# Patient Record
Sex: Male | Born: 1935 | Race: White | Hispanic: No | Marital: Married | Smoking: Former smoker
Health system: Southern US, Community
[De-identification: ages and names within clinical notes are randomized; demographics above are authoritative.]

## PROBLEM LIST (undated history)

## (undated) DIAGNOSIS — C61 Malignant neoplasm of prostate: Secondary | ICD-10-CM

## (undated) DIAGNOSIS — D469 Myelodysplastic syndrome, unspecified: Secondary | ICD-10-CM

## (undated) DIAGNOSIS — I998 Other disorder of circulatory system: Secondary | ICD-10-CM

## (undated) DIAGNOSIS — R011 Cardiac murmur, unspecified: Secondary | ICD-10-CM

## (undated) DIAGNOSIS — L03119 Cellulitis of unspecified part of limb: Secondary | ICD-10-CM

## (undated) DIAGNOSIS — D4620 Refractory anemia with excess of blasts, unspecified: Secondary | ICD-10-CM

## (undated) DIAGNOSIS — D692 Other nonthrombocytopenic purpura: Secondary | ICD-10-CM

## (undated) DIAGNOSIS — K219 Gastro-esophageal reflux disease without esophagitis: Secondary | ICD-10-CM

## (undated) DIAGNOSIS — D46Z Other myelodysplastic syndromes: Secondary | ICD-10-CM

## (undated) DIAGNOSIS — L03115 Cellulitis of right lower limb: Secondary | ICD-10-CM

## (undated) DIAGNOSIS — Z9289 Personal history of other medical treatment: Secondary | ICD-10-CM

## (undated) DIAGNOSIS — R0600 Dyspnea, unspecified: Secondary | ICD-10-CM

## (undated) DIAGNOSIS — K4090 Unilateral inguinal hernia, without obstruction or gangrene, not specified as recurrent: Secondary | ICD-10-CM

## (undated) DIAGNOSIS — I890 Lymphedema, not elsewhere classified: Secondary | ICD-10-CM

## (undated) DIAGNOSIS — L02419 Cutaneous abscess of limb, unspecified: Secondary | ICD-10-CM

## (undated) DIAGNOSIS — D472 Monoclonal gammopathy: Secondary | ICD-10-CM

## (undated) DIAGNOSIS — E039 Hypothyroidism, unspecified: Secondary | ICD-10-CM

## (undated) DIAGNOSIS — M1990 Unspecified osteoarthritis, unspecified site: Secondary | ICD-10-CM

## (undated) DIAGNOSIS — R609 Edema, unspecified: Secondary | ICD-10-CM

## (undated) DIAGNOSIS — D61818 Other pancytopenia: Secondary | ICD-10-CM

## (undated) DIAGNOSIS — L0291 Cutaneous abscess, unspecified: Secondary | ICD-10-CM

## (undated) DIAGNOSIS — D72821 Monocytosis (symptomatic): Secondary | ICD-10-CM

## (undated) HISTORY — DX: Other myelodysplastic syndromes: D46.Z

## (undated) HISTORY — PX: ROBOT ASSISTED LAPAROSCOPIC RADICAL PROSTATECTOMY: SHX5141

## (undated) HISTORY — PX: CATARACT EXTRACTION W/ INTRAOCULAR LENS  IMPLANT, BILATERAL: SHX1307

## (undated) HISTORY — DX: Other nonthrombocytopenic purpura: D69.2

## (undated) HISTORY — DX: Monocytosis (symptomatic): D72.821

## (undated) HISTORY — DX: Cutaneous abscess, unspecified: L02.91

## (undated) HISTORY — DX: Edema, unspecified: R60.9

## (undated) HISTORY — DX: Monoclonal gammopathy: D47.2

## (undated) HISTORY — PX: TONSILLECTOMY AND ADENOIDECTOMY: SUR1326

## (undated) HISTORY — DX: Other disorder of circulatory system: I99.8

## (undated) HISTORY — DX: Refractory anemia with excess of blasts, unspecified: D46.20

## (undated) HISTORY — DX: Cellulitis of right lower limb: L03.115

## (undated) HISTORY — DX: Other pancytopenia: D61.818

---
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
## 1997-10-07 ENCOUNTER — Encounter: Admission: RE | Admit: 1997-10-07 | Discharge: 1997-10-07 | Payer: Self-pay | Admitting: Internal Medicine

## 1997-11-28 ENCOUNTER — Ambulatory Visit (HOSPITAL_COMMUNITY): Admission: RE | Admit: 1997-11-28 | Discharge: 1997-11-28 | Payer: Self-pay | Admitting: Gastroenterology

## 1999-11-21 ENCOUNTER — Encounter: Admission: RE | Admit: 1999-11-21 | Discharge: 1999-11-21 | Payer: Self-pay | Admitting: Internal Medicine

## 1999-11-22 ENCOUNTER — Encounter: Admission: RE | Admit: 1999-11-22 | Discharge: 1999-11-22 | Payer: Self-pay | Admitting: Internal Medicine

## 2000-12-25 ENCOUNTER — Encounter: Admission: RE | Admit: 2000-12-25 | Discharge: 2000-12-25 | Payer: Self-pay | Admitting: Internal Medicine

## 2001-02-02 ENCOUNTER — Encounter (INDEPENDENT_AMBULATORY_CARE_PROVIDER_SITE_OTHER): Payer: Self-pay | Admitting: Specialist

## 2001-02-02 ENCOUNTER — Ambulatory Visit (HOSPITAL_COMMUNITY): Admission: RE | Admit: 2001-02-02 | Discharge: 2001-02-02 | Payer: Self-pay | Admitting: Gastroenterology

## 2001-12-28 ENCOUNTER — Encounter: Admission: RE | Admit: 2001-12-28 | Discharge: 2001-12-28 | Payer: Self-pay | Admitting: Sports Medicine

## 2001-12-28 ENCOUNTER — Encounter: Payer: Self-pay | Admitting: Sports Medicine

## 2002-10-13 ENCOUNTER — Encounter: Admission: RE | Admit: 2002-10-13 | Discharge: 2002-10-13 | Payer: Self-pay | Admitting: Internal Medicine

## 2004-07-05 ENCOUNTER — Ambulatory Visit: Payer: Self-pay | Admitting: Sports Medicine

## 2004-07-19 ENCOUNTER — Ambulatory Visit: Payer: Self-pay | Admitting: Sports Medicine

## 2005-02-27 ENCOUNTER — Ambulatory Visit: Payer: Self-pay | Admitting: Family Medicine

## 2005-03-05 ENCOUNTER — Ambulatory Visit: Payer: Self-pay | Admitting: Sports Medicine

## 2005-03-05 ENCOUNTER — Encounter: Admission: RE | Admit: 2005-03-05 | Discharge: 2005-03-05 | Payer: Self-pay | Admitting: Sports Medicine

## 2005-05-03 ENCOUNTER — Ambulatory Visit: Payer: Self-pay | Admitting: Sports Medicine

## 2006-05-13 ENCOUNTER — Ambulatory Visit (HOSPITAL_COMMUNITY): Admission: RE | Admit: 2006-05-13 | Discharge: 2006-05-13 | Payer: Self-pay | Admitting: Urology

## 2006-06-05 DIAGNOSIS — J309 Allergic rhinitis, unspecified: Secondary | ICD-10-CM | POA: Insufficient documentation

## 2006-06-05 DIAGNOSIS — C4490 Unspecified malignant neoplasm of skin, unspecified: Secondary | ICD-10-CM

## 2006-06-05 DIAGNOSIS — I10 Essential (primary) hypertension: Secondary | ICD-10-CM | POA: Insufficient documentation

## 2006-06-05 DIAGNOSIS — K210 Gastro-esophageal reflux disease with esophagitis: Secondary | ICD-10-CM

## 2006-06-10 ENCOUNTER — Ambulatory Visit: Admission: RE | Admit: 2006-06-10 | Discharge: 2006-08-22 | Payer: Self-pay | Admitting: Radiation Oncology

## 2006-08-06 ENCOUNTER — Encounter (INDEPENDENT_AMBULATORY_CARE_PROVIDER_SITE_OTHER): Payer: Self-pay | Admitting: Specialist

## 2006-08-06 ENCOUNTER — Inpatient Hospital Stay (HOSPITAL_COMMUNITY): Admission: RE | Admit: 2006-08-06 | Discharge: 2006-08-08 | Payer: Self-pay | Admitting: Urology

## 2007-01-22 ENCOUNTER — Ambulatory Visit: Payer: Self-pay | Admitting: Sports Medicine

## 2007-01-27 LAB — CONVERTED CEMR LAB
ALT: 19 units/L (ref 0–53)
AST: 16 units/L (ref 0–37)
Albumin: 4.6 g/dL (ref 3.5–5.2)
Alkaline Phosphatase: 66 units/L (ref 39–117)
BUN: 19 mg/dL (ref 6–23)
CO2: 24 meq/L (ref 19–32)
Calcium: 9.3 mg/dL (ref 8.4–10.5)
Chloride: 104 meq/L (ref 96–112)
Cholesterol: 203 mg/dL — ABNORMAL HIGH (ref 0–200)
Creatinine, Ser: 0.92 mg/dL (ref 0.40–1.50)
Glucose, Bld: 103 mg/dL — ABNORMAL HIGH (ref 70–99)
HDL: 40 mg/dL (ref >39)
LDL Cholesterol: 134 mg/dL — ABNORMAL HIGH (ref 0–99)
Potassium: 4.4 meq/L (ref 3.5–5.3)
Sodium: 141 meq/L (ref 135–145)
Total Bilirubin: 0.8 mg/dL (ref 0.3–1.2)
Total CHOL/HDL Ratio: 5.1
Total Protein: 7 g/dL (ref 6.0–8.3)
Triglycerides: 147 mg/dL (ref <150)
VLDL: 29 mg/dL (ref 0–40)

## 2008-01-05 ENCOUNTER — Encounter: Payer: Self-pay | Admitting: Internal Medicine

## 2009-10-17 ENCOUNTER — Ambulatory Visit: Payer: Self-pay | Admitting: Sports Medicine

## 2009-10-17 DIAGNOSIS — C61 Malignant neoplasm of prostate: Secondary | ICD-10-CM

## 2009-10-17 DIAGNOSIS — M5430 Sciatica, unspecified side: Secondary | ICD-10-CM | POA: Insufficient documentation

## 2009-10-17 DIAGNOSIS — M545 Low back pain: Secondary | ICD-10-CM

## 2009-11-07 ENCOUNTER — Ambulatory Visit: Payer: Self-pay | Admitting: Sports Medicine

## 2009-11-07 ENCOUNTER — Ambulatory Visit: Payer: Self-pay | Admitting: Family Medicine

## 2009-11-07 ENCOUNTER — Encounter: Admission: RE | Admit: 2009-11-07 | Discharge: 2009-11-07 | Payer: Self-pay | Admitting: Sports Medicine

## 2009-11-07 DIAGNOSIS — S22009A Unspecified fracture of unspecified thoracic vertebra, initial encounter for closed fracture: Secondary | ICD-10-CM | POA: Insufficient documentation

## 2009-11-07 DIAGNOSIS — M25559 Pain in unspecified hip: Secondary | ICD-10-CM

## 2009-11-07 LAB — CONVERTED CEMR LAB
HCT: 39.1 % (ref 39.0–52.0)
Hemoglobin: 12.7 g/dL — ABNORMAL LOW (ref 13.0–17.0)
MCHC: 32.5 g/dL (ref 30.0–36.0)
MCV: 97 fL (ref 78.0–100.0)
Platelets: 154 10*3/uL (ref 150–400)
RBC: 4.03 M/uL — ABNORMAL LOW (ref 4.22–5.81)
RDW: 16.5 % — ABNORMAL HIGH (ref 11.5–15.5)
WBC: 4.2 10*3/uL (ref 4.0–10.5)

## 2009-11-08 ENCOUNTER — Encounter (INDEPENDENT_AMBULATORY_CARE_PROVIDER_SITE_OTHER): Payer: Self-pay | Admitting: *Deleted

## 2009-11-10 ENCOUNTER — Encounter: Admission: RE | Admit: 2009-11-10 | Discharge: 2009-11-10 | Payer: Self-pay | Admitting: Sports Medicine

## 2009-11-28 ENCOUNTER — Ambulatory Visit: Payer: Self-pay | Admitting: Sports Medicine

## 2010-05-08 NOTE — Assessment & Plan Note (Signed)
Summary: f/u,mc   Vital Signs:  Patient profile:   77 year old male BP sitting:   135 / 84  Vitals Entered By: Lillia Pauls CMA (November 07, 2009 10:35 AM)  History of Present Illness: Joel Macdonald comes back with more left hip and leg pain also low back pain now getting some pain into rt hip  the exercises and stretches were hard and sometimes inc the pain  he notes that if he gets up and gets going during day pain is less at night hard to sleep tramadol made him feel woozy so he stopped Gabapentin - he got up to a 300 mgm dose at night and the sciatic pain was not difft and also he felt very groggy so he stopped this as well  No recent injury  Has done well thus far from his prostate surgery PSA this summer was 0  Physical Exam  General:  Well-developed,well-nourished,in no acute distress; alert,appropriate and cooperative throughout examination Msk:  Hips normal ROM bilat with RT somewhat better than left however, IR/ ER and flexion all cause some pain to radiate into groin  low back feels tight w movement and causes some tightness down lat left leg  able to walk w normal strenght however, has more trendelenburg change with tilt to left   Impression & Recommendations:  Problem # 1:  BACK PAIN, LUMBAR (ICD-724.2)  His updated medication list for this problem includes:    Tramadol Hcl 50 Mg Tabs (Tramadol hcl) .Marland Kitchen... 1 by mouth qid prn  Orders: Radiology other (Radiology Other)   concerned about lumbar stenosis and DDD  will ck films  Problem # 2:  SCIATICA, LEFT (ICD-724.3)  His updated medication list for this problem includes:    Tramadol Hcl 50 Mg Tabs (Tramadol hcl) .Marland Kitchen... 1 by mouth qid prn  Orders: Radiology other (Radiology Other)  hold meds for now but consider low dose amitriptyline at hs  Problem # 3:  HIP PAIN, BILATERAL (ICD-719.45)  His updated medication list for this problem includes:    Tramadol Hcl 50 Mg Tabs (Tramadol hcl) .Marland Kitchen... 1 by mouth  qid prn  Orders: Radiology other (Radiology Other) this may be referred from back but will ck AP of pelvis for DJD or mets  Problem # 4:  Hx of ADENOCARCINOMA, PROSTATE, GLEASON GRADE 7 (ICD-185)  Future Orders: Miscellaneous Lab Charge-[facility] ([id number]) ... 11/06/2009  Doubt Mets but ck CBC and ESr  Review xrays  Complete Medication List: 1)  Tramadol Hcl 50 Mg Tabs (Tramadol hcl) .Marland Kitchen.. 1 by mouth qid prn 2)  Neurontin 100 Mg Caps (Gabapentin) .... Take one tab qhs  Other Orders: Future Orders: Miscellaneous Lab Charge-[facility] [id number]) ... 11/07/2010

## 2010-05-08 NOTE — Assessment & Plan Note (Signed)
Summary: TO SEE DR Klee Kolek/[facility]   Vital Signs:  Patient profile:   77 year old male Height:      74 inches Weight:      218 pounds BMI:     28.09 BP sitting:   152 / 90  (right arm) Cuff size:   regular  Vitals Entered By: Tessie Fass CMA (October 17, 2009 9:17 AM) CC: left hip pain Pain Assessment Patient in pain? yes     Location: left hip Intensity: 7   CC:  left hip pain.  History of Present Illness: Some bilat hip pain worse on left now radiates down to thigh and also along sciatic distribution also lower back pain that will shift to leg  goes to gym at least 3 days per week does treadmill leg press and bike yesterday caused increased pain  comes for eval    Physical Exam  General:  Well-developed,well-nourished,in no acute distress; alert,appropriate and cooperative throughout examination Msk:  Hip shows norm ROM bilat with good IR, ER and flexion SLR does create some sciatic sxs on left no weakness on heel, toe, tandem, supinated walk good strength on neuro testing  FABER on left causes mild sxs and dec mobility  dec flex of low back   Impression & Recommendations:  Problem # 1:  BACK PAIN, LUMBAR (ICD-724.2)  His updated medication list for this problem includes:    Tramadol Hcl 50 Mg Tabs (Tramadol hcl) .Marland Kitchen... 1 by mouth qid prn  I think this is likely DJD not any red flags for met dx from his prostate ca recent pSA was 0  given knee to chest series  reck 6 wks  Problem # 2:  SCIATICA, LEFT (ICD-724.3)  His updated medication list for this problem includes:    Tramadol Hcl 50 Mg Tabs (Tramadol hcl) .Marland Kitchen... 1 by mouth qid prn   will hope this helps enough if not consider nite time neurontin  Problem # 3:  Hx of ADENOCARCINOMA, PROSTATE, GLEASON GRADE 7 (ICD-185) followed by dr Ferne Reus [facility] stable on follow up  Complete Medication List: 1)  Tramadol Hcl 50 Mg Tabs (Tramadol hcl) .Marland Kitchen.. 1 by mouth qid prn  Patient  Instructions: 1)  avoid leg presses 2)  treadmill and bike are good 3)  any exercises that don't bother this are fine 4)  begin tramadol at least twice daily but up to 4 times if needed 5)  may want to get off voltaren 6)  let's see how this does over next 4 to 6 weeks and recheck at that time Prescriptions: TRAMADOL HCL 50 MG TABS (TRAMADOL HCL) 1 by mouth qid prn  #120 x 3   Entered and Authorized by:   Enid Baas MD   Signed by:   Enid Baas MD on 10/17/2009   Method used:   Print then Give to Patient   RxID:   [id number]

## 2010-05-08 NOTE — Assessment & Plan Note (Signed)
Summary: F/U,MC   Vital Signs:  Patient profile:   77 year old male BP sitting:   142 / 80  Vitals Entered By: Lillia Pauls CMA (November 28, 2009 11:08 AM)  History of Present Illness: f/u left hip and leg pain; also low back pain - all have improved. pain is now controlled with 2-3 ASA daily. patient states that Ibuprofen caused nose bleeds. other medications (tramadol and gabapentin) caused sedation.   XRAY: 1.  Degenerative disc disease at L4-5. 2.  Degenerative change involves the facet joints of L4-5 and L5-S1. 3.  Probable old partial compression deformity of T12.      Current Medications (verified): 1)  None  Allergies (verified): No Known Drug Allergies PMH-FH-SH reviewed for relevance  Review of Systems MS:  Denies low back pain and muscle aches. Neuro:  Denies numbness and tingling.  Physical Exam  General:  Well-developed,well-nourished,in no acute distress; alert,appropriate and cooperative throughout examination Msk:  Hips: normal ROM bilat, IR/ ER and flexion - no pain now; low back with decreased lordosis, normal ROM. neg FABER bilat. neg SLR bilat. good LE strength.  Pulses:  2 + DP Extremities:  no edema Neurologic:  strength normal in all extremities, sensation intact to light touch, and DTRs symmetrical and normal.     Impression & Recommendations:  Problem # 1:  HIP PAIN, BILATERAL (ICD-719.45) Assessment Improved  The following medications were removed from the medication list:    Tramadol Hcl 50 Mg Tabs (Tramadol hcl) .Marland Kitchen... 1 by mouth qid prn  Problem # 2:  BACK PAIN, LUMBAR (ICD-724.2) Assessment: Improved  The following medications were removed from the medication list:    Tramadol Hcl 50 Mg Tabs (Tramadol hcl) .Marland Kitchen... 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  Problem # 4:  SCIATICA, LEFT (ICD-724.3)  The  following medications were removed from the medication list:    Tramadol Hcl 50 Mg Tabs (Tramadol hcl) .Marland Kitchen... 1 by mouth qid as needed  this is less and will follow before starting amitriptyline If not staying at low level will start aft very low dose  Patient Instructions: 1)  follow up as needed

## 2010-05-08 NOTE — Miscellaneous (Signed)
Summary: bone mineral density appt  appt for bone mineral density test is schd for fri, aug 5th at 9:[facility]. address is [address], [place] [address]. phone number is [phone]

## 2010-08-24 NOTE — Procedures (Signed)
[place]  Patient:    Joel Macdonald, Joel Macdonald Visit Number: [id number] MRN: [id number]          Service Type: END Location: ENDO Attending Physician:  Rich Brave Dictated by:   Florencia Reasons, M.D. Proc. Date: 02/02/01 Admit Date:  02/02/2001   CC:         C. Ulyess Mort, M.D.   Procedure Report  PROCEDURE PERFORMED:  Colonoscopy with biopsies.  ENDOSCOPIST:  Florencia Reasons, M.D.  INDICATIONS FOR PROCEDURE:  Follow-up of colonic adenomas removed several years ago in this 77 year old physician.  FINDINGS:  Several small polyps removed.  Moderate sigmoid diverticulosis.  DESCRIPTION OF PROCEDURE:  The nature, purpose and risks of the procedure were familiar to the patient from prior examination and he provided written consent.  Sedation was fentanyl 75 mcg and Versed 7.5 mg IV without arrhythmias or desaturation.  The Olympus adjustable tension pediatric video colonoscope was advanced with mild difficulty to a somewhat fixated and angulated sigmoid region, where there was a lot of diverticular disease, more than I remembered there being several years ago, and then quite easily, the remainder of the way to the cecum was identified by visualization of the appendiceal orifice and ileocecal valve.  Pullback was then performed.  There were two small sessile polyps removed from the ascending colon by cold biopsy technique, and two small sessile polyps removed at about 45 and 40 cm by cold biopsy technique.  No large polyps, cancer, colitis, or vascular malformations were observed.  There was a significant sigmoid diverticulosis as noted above.  Retroflexion was normal.  IMPRESSION:  Diminutive colon polyps.  Moderately severe sigmoid diverticulosis.  PLAN:  Await pathology with follow-up colonoscopy in three to five years. Dictated by:   Florencia Reasons, M.D. Attending Physician:  Rich Brave DD:   02/02/01 TD:  02/02/01 Job: [id number]

## 2010-08-24 NOTE — Op Note (Signed)
NAME:  Joel Macdonald, Joel Macdonald                ACCOUNT NO.:  [id number]   MEDICAL RECORD NO.:  [id number]          PATIENT TYPE:  INP   LOCATION:  1413                         FACILITY:  [facility]   PHYSICIAN:  Lucrezia Starch. Earlene Plater, M.D.  DATE OF BIRTH:  03-Apr-1931   DATE OF PROCEDURE:  08/06/2006  DATE OF DISCHARGE:                               OPERATIVE REPORT   DIAGNOSIS:  Adenocarcinoma of prostate.   OPERATIVE PROCEDURE:  Robotic radical prostatectomy and bilateral pelvic  lymphadenectomy.   SURGEON:  Lucrezia Starch. Earlene Plater, M.D.   ASSISTANT:  Heloise Purpura, M.D.   ANESTHESIA:  General endotracheal anesthesia.   BLOOD LOSS:  400 mL.   TUBES:  20-French coude Foley catheter and a large round Blake drain.   COMPLICATIONS:  None.   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  He  subsequently underwent ultrasound and biopsy of the prostate on May 01, 2006, and was to found have a Gleason score 7 which was 3+4  adenocarcinoma in approximately 10% of the biopsies from the left side  of the prostate.  His metastatic workup has been negative.  After  understanding the risks, benefits and alternatives, he has elected to  undergo the above procedure.   PROCEDURE IN DETAIL:  The patient was placed in a supine position, after  proper general endotracheal anesthesia, was placed in the exaggerated  lithotomy position, prepped and draped in a sterile fashion.  A 24-  French 30 mL balloon catheter was passed in the bladder and the urine  was drained.  A periumbilical incision was made and under direct vision,  the peritoneum was punctured. A 12 mm cannula was placed and the abdomen  was insufflated.  Inspection of the abdomen revealed some obesity but no  lymphadenopathy or other abnormalities noted. Right and left robotic arm  ports were placed one handbreadth lateral to the right and left side to  the  periumbilical port.  The fourth arm port was placed lateral to the  left robotic port and two working ports were placed, a 5 mm port just  superior and lateral to the camera port and a another 12 mm port was  placed on the right side laterally. The hot shears were placed in the  right robotic arm, the bipolar grasper in the left robotic arm, and a  prograsp in the third arm. The bladder was filled with approximately 200  mL of sterile water.  The anterior bladder flap was created, the medial  umbilical ligaments were taken down, and the space of Retzius was  entered.   The area was defatted. The endopelvic fascia was taken down bilaterally  to the area of the puboprostatic ligaments and the dorsal vein complex.  The puboprostatic ligaments were carefully taken down, not in their  entirety, and the dorsal vein complex was clipped and cut with an Endo-  GIA stapler.  The bladder neck was then approached and sharply dissected  down to the catheter.  This was used as a  traction device with the  fourth arm and the posterior prostate was taken from the bladder neck  and seminal vesicles and ampulla vas deferens were dissected out, were  amputated bilaterally, and care was made to avoid the nerves at the tips  of the seminal vesicles.  Denonvilliers' fascia was then taken down and  the plane was created onto the prostate. A bilateral nerve spare was  performed.  The dorsal neurovascular bundles were taken posterior  laterally utilizing a veil technique and the pedicles were taken  bilateral in serial packets and clipped with Hem-A-Lock clips.  The  prostate was taken down to the apex.  The urethra was sharply incised  anteriorly. The posterior urethral plate was taken down and the specimen  was placed in the right lower quadrant.  There was some slight oozing  from the left pedicle area that was oversewn with a figure-of-eight 3-0  Vicryl suture.   Next, the lymph nodes were approached and  bilateral pelvic  lymphadenectomy was performed. Both obturator and external iliac lymph  nodes were taken down, the obturator nerve and vessels were identified  and protected, and the nodes were clipped proximally and distally and  removed as separate right and left side.  Good hemostasis was noted be  present.  The urethrovesical anastomosis was then performed. A holding  stitch with the 3-0 catgut are C3 Vicryl suture was placed in the 6  o'clock position and the anastomosis was created with 3-0 Monocryl dyed  and undyed sutures tied together in a running fashion and tied  anteriorly.  An amp of indigo carmine had been given IV. The orifices  were identified and protected.   Following this, a 20-French coude Foley catheter was passed easily in  the bladder and inflated with 10 mL and the bladder was noted to  irrigate clear without leakage.  A large round Blake drain was placed  through the fourth arm port and placed into the pelvis.  Utilizing an  endocatch bag, the prostate was captured. The right 12 mm port was  closed under direct vision with a suture passer.  Each of the ports were  removed and visualized, there was no bleeding noted to be present.  The  remainder the peritoneal cavity was deinsufflated and there was good  hemostasis present.  The specimen was then removed.  The periumbilical  incision fascia was closed with a running 2-0 Vicryl suture. All wounds  were injected with 0.25% Marcaine and closed with skin staples and  dressed sterilely.  The bladder was again irrigated clear and the drain  was sutured in with 3-0 nylon suture.  The patient tolerated the  procedure well.  There were no complications.  He was taken to the  recovery room stable.      Ronald L. Earlene Plater, M.D.  Electronically Signed     RLD/MEDQ  D:  08/06/2006  T:  08/06/2006  Job:  [id number]

## 2010-08-24 NOTE — H&P (Signed)
NAME:  Joel Macdonald, Joel Macdonald                ACCOUNT NO.:  [id number]   MEDICAL RECORD NO.:  [id number]          PATIENT TYPE:  INP   LOCATION:  1413                         FACILITY:  [facility]   PHYSICIAN:  Lucrezia Starch. Earlene Plater, M.D.  DATE OF BIRTH:  Jun 10, 1930   DATE OF ADMISSION:  08/06/2006  DATE OF DISCHARGE:                              HISTORY & PHYSICAL   CHIEF COMPLAINT:  I have prostate cancer.   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  He had a biopsy in the past  which was negative.  He was subsequently found to have a PSA of 11.77.  He underwent transrectal ultrasound and biopsy of the prostate on  May 01, 2006, which revealed a Gleason score 7 which was 3 + 4  adenocarcinoma in 10% of the biopsies from the left side of the  prostate.  His metastatic workup was negative, and after understanding  the risks, benefits and alternatives,  elected to proceed with robotic  radical prostatectomy and bilateral pelvic lymphadenectomy.   PAST MEDICAL HISTORY:  He has no known allergies.   MEDICATIONS:  He is on one research for which he stopped well ahead.  Otherwise, an aspirin which he stopped.   ILLNESSES:  He essentially has none.  He has some mild arthritis.   SURGERY:  He had a T&A as a child and a colonoscopy.  He has had IV  sedation the past and really had no anesthesia problems.  He had a  series of cardiac symptoms in the past but none recently and his workup  has been negative.   SOCIAL HISTORY:  He has 1-2 drinks daily.  Negative smoker.   FAMILY HISTORY:  Not significant.   REVIEW OF SYSTEMS:  He has no shortness of breath, dyspnea on exertion,  chest pain or GI complaints.   PHYSICAL EXAMINATION:  VITAL SIGNS:  Blood pressure 145/93, pulse 89,  respiration 14, temperature 97.5 degrees Fahrenheit.  GENERAL:  He is well nourished, well groomed, oriented x3.  HEENT:  Normal.  NECK:  Without masses or  thyromegaly.  CHEST:  Normal diaphragmatic motion.  HEART:  Normal sinus rhythm without murmurs or gallops.  ABDOMEN:  Soft, nontender without mass or organomegaly.  EXTREMITY:  Normal.  NEURO:  Intact.  SKIN:  Normal.  GENITOURINARY:  Penis, meatus, scrotum, testicle, adnexa, anus, and  perineum:  Normal.  RECTAL:  The vault is empty.  Prostate approximately 30 g, it is smooth  and really dormant.   IMPRESSION:  Clinical stage T1c adenocarcinoma of the prostate.   PLAN:  Robotic radical prostatectomy with bilateral pelvic  lymphadenectomy.      Ronald L. Earlene Plater, M.D.  Electronically Signed     RLD/MEDQ  D:  08/06/2006  T:  08/07/2006  Job:  [id number]

## 2010-08-24 NOTE — Discharge Summary (Signed)
NAME:  Joel Macdonald, LASTER                ACCOUNT NO.:  [id number]   MEDICAL RECORD NO.:  [id number]          PATIENT TYPE:  INP   LOCATION:  1413                         FACILITY:  [facility]   PHYSICIAN:  Lucrezia Starch. Earlene Plater, M.D.  DATE OF BIRTH:  07/25/1930   DATE OF ADMISSION:  08/06/2006  DATE OF DISCHARGE:  08/08/2006                               DISCHARGE SUMMARY   ADMISSION DIAGNOSIS:  Prostate cancer.   DISCHARGE DIAGNOSIS:  Status post robotic assisted laparoscopic  prostatectomy with bilateral pelvic lymphadenectomy.   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  He had a biopsy which was negative.  Most  recently his PSA was 11.77.  He underwent repeat transrectal ultrasound  biopsy of the prostate on May 01, 2006.  This biopsy revealed a  Gleason score of 3 + 4 equals 7 adenocarcinoma in 10% of the biopsies  from the left side of the prostate.  His metastatic workup was negative.  After a lengthy discussion with Dr. Gaynelle Arabian, he elected to undergo  robotic assisted laparoscopic prostatectomy.   PAST MEDICAL HISTORY:  Significant for arthritis of the hands.   PAST SURGICAL HISTORY:  Tonsillectomy and adenoidectomy as a child.   ALLERGIES:  None known.   HOSPITAL COURSE:  On August 06, 2006 the patient was electively admitted  to [facility] long hospital under the care of Dr. Darvin Neighbours; he underwent  the following procedure:  Robotic-assisted radical laparoscopic  prostatectomy with bilateral pelvic rim __________.  He tolerated this  procedure well; transferring in stable condition to PACU.  He was  extubated immediately following surgery, and awoke from surgery  neurologically intact.   On postop day #1 vital signs remained stable, abdomen was soft; wounds  were clear.  The Bowie drain was discontinued.  WBC 7.2, hemoglobin  12.5, hematocrit 36.7.  Sodium 134, potassium 4.8, chloride 104, CO2 28,  glucose 165, BUN 12,  creatinine 0.87.  He remained and clear liquids.   On postop day #2 the patient continued to do well, ambulating without  difficulty, passing flatus, continued to tolerate clear liquids.  Pain  minimal with PCA and vital signs remained stable.  Abdomen remained soft  with staples intact, Foley draining clear urine.  The patient is  discharged home.  Condition on discharge improved.   DISCHARGE MEDICATIONS:  1. Vicodin 1-2 p.o. q.3-4 h.  p.r.n. pain.  2. Cipro 500 mg one p.o. b.i.d.  3. Colace 100 mg one p.o. b.i.d.   ACTIVITY:  May increase slowly, may walk upstairs, may shower and bathe.  No lifting greater than 5 pounds for three weeks, no driving for three  weeks, no sexual activity for four weeks.   DIET:  Continue clear liquids but may advance as tolerated.   WOUND CARE:  The patient may shower.   FOLLOWUP:  To see Cammy Copa, FNP-C next Tuesday; he will call  to make that appointment.     ______________________________  Alessandra Bevels. Chase Picket, FNP-C      Ronald L. Earlene Plater, M.D.  Electronically Signed    JML/MEDQ  D:  08/08/2006  T:  08/08/2006  Job:  [id number]   cc:   Sibyl Parr. Darrick Penna, M.D.

## 2011-05-07 ENCOUNTER — Telehealth: Payer: Self-pay | Admitting: Oncology

## 2011-05-07 NOTE — Telephone Encounter (Signed)
Called pt, left message to call us back to confirm appt on 05/20/11

## 2011-05-08 ENCOUNTER — Telehealth: Payer: Self-pay | Admitting: Oncology

## 2011-05-08 NOTE — Telephone Encounter (Signed)
Talked to pt, he is aware of appt on 05/20/11

## 2011-05-09 ENCOUNTER — Telehealth: Payer: Self-pay | Admitting: Oncology

## 2011-05-09 NOTE — Telephone Encounter (Signed)
Referred by Dr. Merlene Laughter Dx- Pancytopenia

## 2011-05-20 ENCOUNTER — Ambulatory Visit: Payer: Medicare Other

## 2011-05-20 ENCOUNTER — Other Ambulatory Visit: Payer: Self-pay

## 2011-05-20 ENCOUNTER — Ambulatory Visit (HOSPITAL_BASED_OUTPATIENT_CLINIC_OR_DEPARTMENT_OTHER): Payer: Medicare Other | Admitting: Oncology

## 2011-05-20 ENCOUNTER — Other Ambulatory Visit (HOSPITAL_BASED_OUTPATIENT_CLINIC_OR_DEPARTMENT_OTHER): Payer: Medicare Other | Admitting: Lab

## 2011-05-20 DIAGNOSIS — D61818 Other pancytopenia: Secondary | ICD-10-CM

## 2011-05-20 LAB — CBC & DIFF AND RETIC
BASO%: 0.7 % (ref 0.0–2.0)
Basophils Absolute: 0 10*3/uL (ref 0.0–0.1)
EOS%: 0.7 % (ref 0.0–7.0)
Eosinophils Absolute: 0 10*3/uL (ref 0.0–0.5)
HCT: 37.1 % — ABNORMAL LOW (ref 38.4–49.9)
HGB: 12.7 g/dL — ABNORMAL LOW (ref 13.0–17.1)
LYMPH%: 37.9 % (ref 14.0–49.0)
MCH: 33.3 pg (ref 27.2–33.4)
MCHC: 34.2 g/dL (ref 32.0–36.0)
MCV: 97.4 fL (ref 79.3–98.0)
MONO#: 0.4 10*3/uL (ref 0.1–0.9)
MONO%: 9.9 % (ref 0.0–14.0)
NEUT%: 50.8 % (ref 39.0–75.0)
Platelets: 127 10*3/uL — ABNORMAL LOW (ref 140–400)
RDW: 16.1 % — ABNORMAL HIGH (ref 11.0–14.6)
Retic Ct Abs: 78.11 10*3/uL (ref 34.80–93.90)
WBC: 4.4 10*3/uL (ref 4.0–10.3)
lymph#: 1.7 10*3/uL (ref 0.9–3.3)

## 2011-05-20 LAB — IRON AND TIBC
%SAT: 18 % — ABNORMAL LOW (ref 20–55)
Iron: 68 ug/dL (ref 42–165)
TIBC: 381 ug/dL (ref 215–435)
UIBC: 313 ug/dL (ref 125–400)

## 2011-05-20 LAB — FERRITIN: Ferritin: 106 ng/mL (ref 22–322)

## 2011-05-20 LAB — MORPHOLOGY: PLT EST: DECREASED

## 2011-05-20 LAB — CHCC SMEAR

## 2011-05-20 LAB — LACTATE DEHYDROGENASE: LDH: 139 U/L (ref 94–250)

## 2011-05-20 NOTE — Progress Notes (Signed)
New Patient Hematology-Oncology Evaluation   Joel Macdonald [id number] [birth year] 78 y.o. 05/20/2011  CC: Dr. 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 At time of an annual exam last year by his internist blood work was done and showed a mild pancytopenia hemoglobin 13 hematocrit 36.8 MCV 96 white count 3200 47 neutrophils 41 lymphocytes 9 monocytes 1 eosinophil platelet count 147,000 done 04/16/2010. Blood work was repeated again this January on 05/03/2011. Hemoglobin 12.2 hematocrit 35.5 MCV 100.7 white count 2900 differential with 46 neutrophils 42 lymphocytes 11 monocytes platelet count 116,000. A blood chemistry profile was normal and specifically liver functions normal. Serum total protein 6.7 with albumin 4.6. Urinalysis negative for blood or protein. He is entirely asymptomatic. His only medications are occasional aspirin, Tylenol, and an acid blocker for reflux. He underwent a robotic prostatectomy for prostate cancer in 2006 but didn't require any post operative radiation or chemotherapy.  He has no history of exposure to organic chemicals or therapeutic radiation. He does drink 2-3 alcoholic beverages daily on a regular basis. His mother died at age 87 of Hodgkin's lymphoma. No other family history of a blood disorder.   PMH: No hypertension, MI, asthma, emphysema, tuberculosis, stomach ulcers, no history of hepatitis, yellow jaundice, malaria, thyroid disease, seizure, stroke, blood clots. Previous prostatectomy as noted. Tonsillectomy at age 86. Bilateral cataract surgery.  Allergies: No Known Allergies  Medications: See above    No current facility-administered medications on file as of 05/20/2011.    Social History:   See above. He is a never smoker. 3 ounces of  alcohol daily. No toxic or chemical exposures. 2 sons one a cardiologist living in [place]. [place] the other with a learning disability still living at home. Wife accompanies him today. She reminded me that I saw her in the past for iron malabsorption anemia.  Family History: See above. Father died age 75 of a stroke. Mother with Hodgkin's lymphoma in her 60s. No siblings.  Review of Systems: Constitutional symptoms: No anorexia weight loss fevers HEENT: No headache or change in vision Respiratory: No cough or dyspnea Cardiovascular:  No chest pain or palpitations Gastrointestinal ROS: No change in bowel habit Genito-Urinary ROS: Not questioned Hematological and Lymphatic: He is a problem with recurrent epistaxis occurring once a week usually after he blows his nose after a hot shower. No episodes for the last 2 months. Musculoskeletal: Mild arthritis pain primarily in hips and shoulders Neurologic: No paresthesias Dermatologic: He has been getting some small areas of purpura on his wrists no spontaneous bruising. Remaining ROS negative.  Physical Exam: Blood pressure 137/88, pulse 83, temperature 97.5 F (36.4 C), temperature source Oral, height 6\' 2"  (1.88 m), weight 224 lb 3.2 oz (101.696 kg). Wt Readings from Last 3 Encounters:  05/20/11 224 lb 3.2 oz (101.696 kg)  10/17/09 218 lb (98.884 kg)  01/22/07 233 lb (105.688 kg)    General appearance: Healthy appearing Caucasian man Head: Normal Neck: Full range of motion no thyromegaly or thyroid nodules carotids 2+ no bruits Lymph nodes: No cervical supraclavicular or axillary adenopathy Breasts: Lungs: Clear to auscultation resonant to percussion Heart: Regular cardiac rhythm no murmur or gallop Abdominal: Soft nontender no mass no organomegaly GU: Not examined Extremities: No edema no calf tenderness Neurologic: Alert and oriented, cranial nerves grossly normal, motor strength 5  over 5, reflexes 2+ symmetric, upper body coordination  normal, and gait normal, pupils equal reactive to light optic disc sharp vessels normal Skin: Ruddy complexion of the face. Small 3-5 mm areas of purpura scattered on his wrists bilaterally. These have the appearance of a typical senile purpura.    Lab Results: CBC in our office today hemoglobin 12.7 hematocrit 37.1 MCV 97 white count 4451 neutrophils 38 lymphocytes 10 monocytes platelet count 127,000 Lab Results  Component Value Date   WBC 4.2 11/07/2009   HGB 12.7* 11/07/2009   HCT 39.1 11/07/2009   MCV 97.0 11/07/2009   PLT 154 11/07/2009     Chemistry      Component Value Date/Time   NA 141 01/22/2007 2031   K 4.4 01/22/2007 2031   CL 104 01/22/2007 2031   CO2 24 01/22/2007 2031   BUN 19 01/22/2007 2031   CREATININE 0.92 01/22/2007 2031      Component Value Date/Time   CALCIUM 9.3 01/22/2007 2031   ALKPHOS 66 01/22/2007 2031   AST 16 01/22/2007 2031   ALT 19 01/22/2007 2031   BILITOT 0.8 01/22/2007 2031       Pathology:   Review of peripheral blood film: Normochromic normocytic red cells. No polychromasia. Platelets appear normal with an occasional large platelet. Neutrophils are hypogranular, rare cell with a Pelger-Hut  Nucleus, occasional metamyelocyte, a band form, and a single myelocyte no blasts   Radiological Studies: No results found.    Impression and Plan: Mild pancytopenia overall stable over 1 year of observation. He has a borderline elevation of monocytes, hypogranular neutrophils on peripheral blood film an occasional early myeloid forms MCV borderline elevated. My overall clinical impression is that he likely has a early myelodysplastic syndrome. However, differential also includes B12 or folic acid deficiency, other infiltrative disease of the bone marrow in particular multiple myeloma but I feel this is less likely with a low normal serum total protein an absence of proteinuria (although dipstick urine on a test for albumin). We may be seeing some  suppressive effect on his bone marrow function from chronic alcohol use I also feel that this is unlikely given the changes that I am seeing him the review of the peripheral blood.  I'm going to check some baseline lab studies including ferritin, B12, folic acid, serum immunoglobulins with immunofixation electrophoresis, kappa and lambda light chain analysis. I will call him with the results. If they are normal as I anticipate a might be, I will do a bone marrow aspiration and biopsy. We discussed this today.     Levert Feinstein, MD 05/20/2011, 7:19 PM

## 2011-05-21 ENCOUNTER — Telehealth: Payer: Self-pay | Admitting: Oncology

## 2011-05-21 ENCOUNTER — Other Ambulatory Visit: Payer: Self-pay | Admitting: Oncology

## 2011-05-21 NOTE — Telephone Encounter (Signed)
Called pt, left message regarding appt on 06/17/10,md visit

## 2011-05-23 LAB — IMMUNOFIXATION ELECTROPHORESIS
IgA: 81 mg/dL (ref 68–379)
IgG (Immunoglobin G), Serum: 700 mg/dL (ref 650–1600)
IgM, Serum: 309 mg/dL — ABNORMAL HIGH (ref 41–251)
Total Protein, Serum Electrophoresis: 7 g/dL (ref 6.0–8.3)

## 2011-05-23 LAB — KAPPA/LAMBDA LIGHT CHAINS
Kappa free light chain: 3.72 mg/dL — ABNORMAL HIGH (ref 0.33–1.94)
Kappa:Lambda Ratio: 3.41 — ABNORMAL HIGH (ref 0.26–1.65)
Lambda Free Lght Chn: 1.09 mg/dL (ref 0.57–2.63)

## 2011-05-23 LAB — VITAMIN B12: Vitamin B-12: 374 pg/mL (ref 211–911)

## 2011-05-23 LAB — FOLATE: Folate: 11.1 ng/mL

## 2011-05-28 ENCOUNTER — Other Ambulatory Visit: Payer: Self-pay | Admitting: Oncology

## 2011-05-29 ENCOUNTER — Telehealth: Payer: Self-pay | Admitting: Oncology

## 2011-05-29 NOTE — Progress Notes (Signed)
bmbx scheduled per Dr Cyndie Chime with Marcelino Duster, chemo scheduler.  Pt to arrive at 0800 for lab, procedure room at 0830 for 0900 procedure.  Time & date verified with Montez Morita in flow cytometry.  Pt notified by phone.  dph

## 2011-05-29 NOTE — Telephone Encounter (Signed)
Called pt , left message, MD visit on 06/17/11

## 2011-05-29 NOTE — Telephone Encounter (Signed)
Called pt,left message regarding BMBX on 06/13/11

## 2011-06-13 ENCOUNTER — Encounter: Payer: Medicare Other | Admitting: *Deleted

## 2011-06-13 ENCOUNTER — Ambulatory Visit (HOSPITAL_BASED_OUTPATIENT_CLINIC_OR_DEPARTMENT_OTHER): Payer: Medicare Other | Admitting: *Deleted

## 2011-06-13 ENCOUNTER — Other Ambulatory Visit: Payer: Medicare Other | Admitting: Lab

## 2011-06-13 ENCOUNTER — Other Ambulatory Visit (HOSPITAL_COMMUNITY)
Admission: RE | Admit: 2011-06-13 | Discharge: 2011-06-13 | Disposition: A | Discharge status: Home / Self Care | Payer: Medicare Other | Source: Ambulatory Visit | Attending: Oncology | Admitting: Oncology

## 2011-06-13 DIAGNOSIS — D61818 Other pancytopenia: Secondary | ICD-10-CM

## 2011-06-13 LAB — CBC
HCT: 37 % — ABNORMAL LOW (ref 39.0–52.0)
Hemoglobin: 12.4 g/dL — ABNORMAL LOW (ref 13.0–17.0)
MCHC: 33.5 g/dL (ref 30.0–36.0)
Platelets: 143 10*3/uL — ABNORMAL LOW (ref 150–400)
RBC: 3.72 MIL/uL — ABNORMAL LOW (ref 4.22–5.81)
RDW: 15.9 % — ABNORMAL HIGH (ref 11.5–15.5)
WBC: 2.3 10*3/uL — ABNORMAL LOW (ref 4.0–10.5)

## 2011-06-13 LAB — DIFFERENTIAL
Basophils Absolute: 0 10*3/uL (ref 0.0–0.1)
Basophils Relative: 0 % (ref 0–1)
Eosinophils Absolute: 0 10*3/uL (ref 0.0–0.7)
Eosinophils Relative: 2 % (ref 0–5)
Lymphocytes Relative: 54 % — ABNORMAL HIGH (ref 12–46)

## 2011-06-13 NOTE — Progress Notes (Addendum)
Bone Marrow Biopsy and Aspiration Procedure Note   Informed consent was obtained and potential risks including bleeding, infection and pain were reviewed with the patient.  Posterior iliac crest(s) prepped with Betadine.  Lidocaine 2% local anesthesia infiltrated into the subcutaneous tissue. Premedication:none  Left posterior iliac crest bone marrow biopsy and  aspirate was obtained.   The procedure was tolerated well and there were no complications.  Specimens sent for: routine histopathologic stains and sectioning, flow cytometry and cytogenetics  Physician: Levert Feinstein

## 2011-06-13 NOTE — Progress Notes (Deleted)
Bone Marrow Biopsy and Aspiration Procedure Note   Informed consent was obtained and potential risks including bleeding, infection and pain were reviewed with the patient.  Posterior iliac crest(s) prepped with Betadine.  Lidocaine 2% local anesthesia infiltrated into the subcutaneous tissue. Premedication:  Left posterior iliac crest bone marrow biopsy and  aspirate was obtained.   The procedure was tolerated well and there were no complications.  Specimens sent for: routine histopathologic stains and sectioning, flow cytometry and cytogenetics  Physician: Levert Feinstein

## 2011-06-17 ENCOUNTER — Other Ambulatory Visit: Payer: Medicare Other | Admitting: Lab

## 2011-06-17 ENCOUNTER — Ambulatory Visit (HOSPITAL_BASED_OUTPATIENT_CLINIC_OR_DEPARTMENT_OTHER): Payer: Medicare Other | Admitting: Oncology

## 2011-06-17 ENCOUNTER — Encounter: Payer: Self-pay | Admitting: Oncology

## 2011-06-17 DIAGNOSIS — D61818 Other pancytopenia: Secondary | ICD-10-CM

## 2011-06-17 DIAGNOSIS — D472 Monoclonal gammopathy: Secondary | ICD-10-CM

## 2011-06-17 HISTORY — DX: Other pancytopenia: D61.818

## 2011-06-17 HISTORY — DX: Monoclonal gammopathy: D47.2

## 2011-06-17 NOTE — Progress Notes (Signed)
Hematology and Oncology Follow Up Visit  Joel Macdonald [id number] [birth year] 78 y.o. 06/17/2011 7:23 PM   Principle Diagnosis: Encounter Diagnoses  Name Primary?  . Pancytopenia Yes  . MGUS (monoclonal gammopathy of unknown significance)      Interim History:   Joel Macdonald returns today with his wife to discuss bone marrow biopsy results on procedure done last Thursday, March 7. I did a preliminary review of the bone marrow with the hematopathologist this morning. There are some findings that he would like to examine in more detail and to do special stains. It appears that there are 2 processes going on. There is evidence for mild dyserythropoiesis and dysmegakaryopoiesis  which would support a myelodysplastic syndrome. Chromosome studies were sent to [facility] and should take about 2 weeks to return. In some areas of the clot section and in the aspirate material there are focal aggregates of plasmacytoid lymphocytes. He has a mild elevation of serum IgM so the issue of her early Waldenstrm's macroglobulinemia comes up. I explained that this is really a low-grade B-cell non-Hodgkin's lymphoma that in the majority of patients does not require treatment for many years. If in fact he does have early Waldenstrm's, there is not enough involvement in the marrow to explain his pancytopenia And therefore  no indication for treatment at this time. There is no evidence that he has multiple myeloma or acute leukemia. I'm going to call him when all of the outstanding studies are back on the bone marrow. I don't anticipate that this will change anything that I said above. I'm going to continue to follow his blood counts every 3 months. If he does have a myelodysplastic syndrome with his current hemoglobin of 12 g  treatment would be observation alone.  Medications: reviewed  Allergies: No Known Allergies    Physical Exam: Blood pressure 133/83, pulse 92, temperature 97.6 F (36.4 C),  temperature source Oral, height 6\' 2"  (1.88 m), weight 226 lb 6.4 oz (102.694 kg). Wt Readings from Last 3 Encounters:  06/17/11 226 lb 6.4 oz (102.694 kg)  05/20/11 224 lb 3.2 oz (101.696 kg)  10/17/09 218 lb (98.884 kg)       Lab Results: Lab Results  Component Value Date   WBC 2.3* 06/13/2011   HGB 12.4* 06/13/2011   HCT 37.0* 06/13/2011   MCV 99.5 06/13/2011   PLT 143* 06/13/2011     Chemistry      Component Value Date/Time   NA 141 01/22/2007 2031   K 4.4 01/22/2007 2031   CL 104 01/22/2007 2031   CO2 24 01/22/2007 2031   BUN 19 01/22/2007 2031   CREATININE 0.92 01/22/2007 2031      Component Value Date/Time   CALCIUM 9.3 01/22/2007 2031   ALKPHOS 66 01/22/2007 2031   AST 16 01/22/2007 2031   ALT 19 01/22/2007 2031   BILITOT 0.8 01/22/2007 2031       Impression and Plan: See discussion above   CC:. Dr. Merlene Laughter   Levert Feinstein, MD 3/11/20137:23 PM

## 2011-07-30 ENCOUNTER — Telehealth: Payer: Self-pay

## 2011-07-30 NOTE — Telephone Encounter (Signed)
Received call from pt requesting lab results from last month.   Pt can be reached on cell - [phone].  Note to Dr Cyndie Chime. dph

## 2011-09-17 ENCOUNTER — Other Ambulatory Visit (HOSPITAL_BASED_OUTPATIENT_CLINIC_OR_DEPARTMENT_OTHER): Payer: Medicare Other

## 2011-09-17 DIAGNOSIS — D472 Monoclonal gammopathy: Secondary | ICD-10-CM

## 2011-09-17 DIAGNOSIS — D61818 Other pancytopenia: Secondary | ICD-10-CM

## 2011-09-17 LAB — MORPHOLOGY

## 2011-09-17 LAB — CBC WITH DIFFERENTIAL/PLATELET
BASO%: 0.3 % (ref 0.0–2.0)
MCHC: 34.5 g/dL (ref 32.0–36.0)
MONO#: 0.4 10*3/uL (ref 0.1–0.9)
RBC: 3.39 10*6/uL — ABNORMAL LOW (ref 4.20–5.82)
RDW: 16.4 % — ABNORMAL HIGH (ref 11.0–14.6)
WBC: 2.4 10*3/uL — ABNORMAL LOW (ref 4.0–10.3)
lymph#: 1 10*3/uL (ref 0.9–3.3)

## 2011-09-20 ENCOUNTER — Telehealth: Payer: Self-pay | Admitting: Oncology

## 2011-09-20 ENCOUNTER — Encounter: Payer: Self-pay | Admitting: Oncology

## 2011-09-20 ENCOUNTER — Ambulatory Visit (HOSPITAL_BASED_OUTPATIENT_CLINIC_OR_DEPARTMENT_OTHER): Payer: Medicare Other | Admitting: Oncology

## 2011-09-20 VITALS — BP 129/80 | HR 79 | Temp 98.7°F | Ht 74.0 in | Wt 226.0 lb

## 2011-09-20 DIAGNOSIS — D469 Myelodysplastic syndrome, unspecified: Secondary | ICD-10-CM

## 2011-09-20 DIAGNOSIS — D46Z Other myelodysplastic syndromes: Secondary | ICD-10-CM

## 2011-09-20 DIAGNOSIS — D61818 Other pancytopenia: Secondary | ICD-10-CM

## 2011-09-20 DIAGNOSIS — D4620 Refractory anemia with excess of blasts, unspecified: Secondary | ICD-10-CM

## 2011-09-20 DIAGNOSIS — D472 Monoclonal gammopathy: Secondary | ICD-10-CM

## 2011-09-20 HISTORY — DX: Other myelodysplastic syndromes: D46.Z

## 2011-09-20 HISTORY — DX: Refractory anemia with excess of blasts, unspecified: D46.20

## 2011-09-20 NOTE — Telephone Encounter (Signed)
gv pt appts for 8/13, 10/8, 12/3, and 12/13

## 2011-09-20 NOTE — Progress Notes (Signed)
Hematology and Oncology Follow Up Visit  Joel Macdonald [id number] [birth year] 78 y.o. 09/20/2011 7:29 PM   Principle Diagnosis: Encounter Diagnosis  Name Primary?  . 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 Bone marrow biopsy done on March 7 showed mild dyserythropoiesis and dysmegakaryopoiesis supporting the diagnosis of a myelodysplastic syndrome. Cytogenetic studies returned normal and specifically no deletions were seen in chromosomes 5 or 7. There were some small aggregates of plasmacytoid lymphocytes in the marrow and a slight elevation of serum IgM immunoglobulin so that a concomitant diagnosis of early Waldenstrm's macroglobulinemia could not be excluded. He did start on a trial of vitamin B6. Blood counts followed since February are showing a trend for deterioration. Hemoglobin down from 12.7 in February to 12.4 in March to 11.6 as of 09/17/2011. White count down from 4400 in February, to 2300 in March, white count differential now showing up to 18% monocytes. Platelet count has fluctuated from 127,000 in February, 143,000 in March, with current value of 108,000 in June. He remains asymptomatic.   Medications: reviewed  Allergies: No Known Allergies   Physical Exam: Blood pressure 129/80, pulse 79, temperature 98.7 F (37.1 C), temperature source Oral, height 6\' 2"  (1.88 m), weight 226 lb (102.513 kg). Wt Readings from Last 3 Encounters:  09/20/11 226 lb (102.513 kg)  06/17/11 226 lb 6.4 oz (102.694 kg)  05/20/11 224 lb 3.2 oz (101.696 kg)     General appearance: Well-nourished Caucasian man HENNT:  Lymph nodes: No lymphadenopathy Breasts: Lungs: Heart: Abdomen: Soft nontender no mass no splenomegaly Extremities: Vascular: Neurologic: Skin:  Lab Results: Lab Results  Component Value Date   WBC 2.4* 09/17/2011   HGB 11.6*  09/17/2011   HCT 33.8* 09/17/2011   MCV 99.6* 09/17/2011   PLT 108* 09/17/2011     Chemistry      Component Value Date/Time   NA 141 01/22/2007 2031   K 4.4 01/22/2007 2031   CL 104 01/22/2007 2031   CO2 24 01/22/2007 2031   BUN 19 01/22/2007 2031   CREATININE 0.92 01/22/2007 2031      Component Value Date/Time   CALCIUM 9.3 01/22/2007 2031   ALKPHOS 66 01/22/2007 2031   AST 16 01/22/2007 2031   ALT 19 01/22/2007 2031   BILITOT 0.8 01/22/2007 2031    IgM no significant change 330 mg percent June 11 compare with 309 mg percent February 11 : Review of the peripheral blood film : does not confirm 18% monocytes. No blasts are seen.  Impression and Plan: #1. Low intermediate risk prognosis myelodysplastic syndrome. Deterioration in all 3 cell lines over the last 4 months. I'm going to increase frequency of monitoring his CBCs to every 2 months. We discussed  possible treatments in the future with the most promising being the use of lenalidomide which can give a trilineage response in myelodysplastic syndrome in about a third of the patients. The response is higher in people who have an isolated deletion of 5Q chromosome.  #2. IgM monoclonal gammopathy undetermined significance   CC:. Dr. Merlene Laughter   Levert Feinstein, MD 6/14/20137:29 PM

## 2011-10-08 ENCOUNTER — Ambulatory Visit (INDEPENDENT_AMBULATORY_CARE_PROVIDER_SITE_OTHER): Payer: Medicare Other | Admitting: Sports Medicine

## 2011-10-08 VITALS — BP 134/85

## 2011-10-08 DIAGNOSIS — S22009A Unspecified fracture of unspecified thoracic vertebra, initial encounter for closed fracture: Secondary | ICD-10-CM

## 2011-10-08 DIAGNOSIS — D4620 Refractory anemia with excess of blasts, unspecified: Secondary | ICD-10-CM

## 2011-10-08 DIAGNOSIS — S22080A Wedge compression fracture of T11-T12 vertebra, initial encounter for closed fracture: Secondary | ICD-10-CM | POA: Insufficient documentation

## 2011-10-08 DIAGNOSIS — M5430 Sciatica, unspecified side: Secondary | ICD-10-CM

## 2011-10-08 NOTE — Patient Instructions (Addendum)
For your back and legs: please do the following exercises, two times a day, you can do this on the bed, couch or floor, whichever is most comfortable.   Knee to chest (flexion exercise) Hold for 30 seconds. Switch legs.  Repeat 2-3 times.   Cat and Camel: 30 repetitions, repeat 2-3 times  Hip extension on all 4's: start with 2 sets of 10, increase repetitions and sets slowly.   Prone Extension: Start with 2 sets of 5, slowly increase.

## 2011-10-08 NOTE — Assessment & Plan Note (Signed)
I do not think his current back and leg pain is related to MDS  Suspect this is coming from old compression fx

## 2011-10-08 NOTE — Assessment & Plan Note (Addendum)
18 months ago- distribution of pain consistent with flare up of old injury rather than new injury.  Start him on a series of extension exercises to limit stress of compression Try to do some limited walking as this should help back but also stimulate better immune fxn  Follow up if symptoms return.

## 2011-10-08 NOTE — Progress Notes (Signed)
  Subjective:    Patient ID: Joel Macdonald, male    DOB: May 12, 1930, 78 y.o.   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  He had a T12 compression fracture about 18 months ago and had shooting leg pains from it.  He did home exercises and the pain improved.  Over the last year, it has occasionally bothered him when he drives a car for a long time.  He says that the pain started suddenly again 3 weeks ago.  He had no new injury and had not changed his activity.  He says that the pain shot down both his legs but not below his knees.  He says that as soon as he called to make an appointment with Dr. Darrick Penna, the pain started improving - this was about a week ago- and now he does not have the pain.  He has limited his activity  He had a bone scan done when he had the compression fracture, which was normal.  He has been taking calcium and vitamin D supplements since it happened.   Has significant fatigue lately and testing by Dr Marlena Clipper showed anemia and low counts of all cell lines suggestive or early myodysplasitc syndrome  Review of Systems Pertinent items in HPI.     Objective:   Physical Exam BP 134/85 General appearance: alert, cooperative and no distress Back: Normal curvature, no deformity.  ROM normal  Straight leg test negative LE strength is 5/5 and symmetric Sensation in LE in tact No tenderness to palpation over spine or lumbosacral muscles.  Able to walk on toes and heels.        Assessment & Plan:

## 2011-10-08 NOTE — Assessment & Plan Note (Signed)
Bilateral.  Suspect this is from same T12 compression fracture and he re-irritated the nerves.  Gave him new hand outs for home exercise program focused on extension of back, advised doing these exercises to prevent pain.

## 2011-11-19 ENCOUNTER — Other Ambulatory Visit (HOSPITAL_BASED_OUTPATIENT_CLINIC_OR_DEPARTMENT_OTHER): Payer: Medicare Other | Admitting: Lab

## 2011-11-19 DIAGNOSIS — D61818 Other pancytopenia: Secondary | ICD-10-CM

## 2011-11-19 LAB — CBC WITH DIFFERENTIAL/PLATELET
BASO%: 0.2 % (ref 0.0–2.0)
Eosinophils Absolute: 0 10*3/uL (ref 0.0–0.5)
HCT: 33.4 % — ABNORMAL LOW (ref 38.4–49.9)
LYMPH%: 29 % (ref 14.0–49.0)
MCHC: 34.5 g/dL (ref 32.0–36.0)
MCV: 101.1 fL — ABNORMAL HIGH (ref 79.3–98.0)
MONO%: 23.8 % — ABNORMAL HIGH (ref 0.0–14.0)
NEUT%: 46.7 % (ref 39.0–75.0)
Platelets: 109 10*3/uL — ABNORMAL LOW (ref 140–400)
RBC: 3.3 10*6/uL — ABNORMAL LOW (ref 4.20–5.82)

## 2011-11-19 LAB — MORPHOLOGY

## 2011-11-25 ENCOUNTER — Other Ambulatory Visit: Payer: Self-pay | Admitting: Geriatric Medicine

## 2011-11-25 DIAGNOSIS — K5900 Constipation, unspecified: Secondary | ICD-10-CM

## 2011-11-25 DIAGNOSIS — R109 Unspecified abdominal pain: Secondary | ICD-10-CM

## 2011-11-28 ENCOUNTER — Inpatient Hospital Stay
Admission: RE | Admit: 2011-11-28 | Discharge: 2011-11-28 | Payer: Medicare Other | Source: Ambulatory Visit | Attending: Geriatric Medicine | Admitting: Geriatric Medicine

## 2011-11-28 ENCOUNTER — Ambulatory Visit
Admission: RE | Admit: 2011-11-28 | Discharge: 2011-11-28 | Disposition: A | Discharge status: Home / Self Care | Payer: Medicare Other | Source: Ambulatory Visit | Attending: Geriatric Medicine | Admitting: Geriatric Medicine

## 2011-11-28 DIAGNOSIS — R109 Unspecified abdominal pain: Secondary | ICD-10-CM

## 2011-11-28 DIAGNOSIS — K5900 Constipation, unspecified: Secondary | ICD-10-CM

## 2011-11-28 MED ORDER — IOHEXOL 300 MG/ML  SOLN
125.0000 mL | Freq: Once | INTRAMUSCULAR | Status: AC | PRN
  Administered 2011-11-28: 125 mL via INTRAVENOUS

## 2012-01-14 ENCOUNTER — Other Ambulatory Visit (HOSPITAL_BASED_OUTPATIENT_CLINIC_OR_DEPARTMENT_OTHER): Payer: Medicare Other | Admitting: Lab

## 2012-01-14 DIAGNOSIS — D61818 Other pancytopenia: Secondary | ICD-10-CM

## 2012-01-14 LAB — MORPHOLOGY: PLT EST: DECREASED

## 2012-01-14 LAB — CBC WITH DIFFERENTIAL/PLATELET
Basophils Absolute: 0 10*3/uL (ref 0.0–0.1)
Eosinophils Absolute: 0 10*3/uL (ref 0.0–0.5)
HGB: 11.7 g/dL — ABNORMAL LOW (ref 13.0–17.1)
MCV: 100.4 fL — ABNORMAL HIGH (ref 79.3–98.0)
MONO#: 0.4 10*3/uL (ref 0.1–0.9)
MONO%: 16.5 % — ABNORMAL HIGH (ref 0.0–14.0)
NEUT#: 0.9 10*3/uL — ABNORMAL LOW (ref 1.5–6.5)
RBC: 3.34 10*6/uL — ABNORMAL LOW (ref 4.20–5.82)
RDW: 16.4 % — ABNORMAL HIGH (ref 11.0–14.6)
WBC: 2.6 10*3/uL — ABNORMAL LOW (ref 4.0–10.3)

## 2012-01-14 LAB — CHCC SMEAR

## 2012-02-12 ENCOUNTER — Telehealth: Payer: Self-pay | Admitting: Oncology

## 2012-02-12 NOTE — Telephone Encounter (Signed)
Pt called and wants to r/s appt from 12/3 to 12/6 labs only, patient aware of MD visit

## 2012-03-10 ENCOUNTER — Other Ambulatory Visit: Payer: Medicare Other | Admitting: Lab

## 2012-03-13 ENCOUNTER — Other Ambulatory Visit (HOSPITAL_BASED_OUTPATIENT_CLINIC_OR_DEPARTMENT_OTHER): Payer: Medicare Other

## 2012-03-13 ENCOUNTER — Telehealth: Payer: Self-pay | Admitting: *Deleted

## 2012-03-13 DIAGNOSIS — D61818 Other pancytopenia: Secondary | ICD-10-CM

## 2012-03-13 LAB — CBC WITH DIFFERENTIAL/PLATELET
BASO%: 0.5 % (ref 0.0–2.0)
EOS%: 0.5 % (ref 0.0–7.0)
HGB: 11.3 g/dL — ABNORMAL LOW (ref 13.0–17.1)
MCH: 33.7 pg — ABNORMAL HIGH (ref 27.2–33.4)
MCHC: 33.7 g/dL (ref 32.0–36.0)
MCV: 100 fL — ABNORMAL HIGH (ref 79.3–98.0)
MONO%: 20.6 % — ABNORMAL HIGH (ref 0.0–14.0)
RBC: 3.35 10*6/uL — ABNORMAL LOW (ref 4.20–5.82)
RDW: 16 % — ABNORMAL HIGH (ref 11.0–14.6)
lymph#: 1.1 10*3/uL (ref 0.9–3.3)

## 2012-03-13 LAB — CHCC SMEAR

## 2012-03-13 LAB — MORPHOLOGY

## 2012-03-13 NOTE — Telephone Encounter (Signed)
Discussed results of CBC with Dr. Truett Perna in Dr. Patsy Lager absence.  Called patient and let him know WBC is 1.9 and ANC is 0.3.  Discussed neutropenic precautions and when to go to ED.  Pt. Able to verbalize understanding of instructions.  Pt  Aware of appt. On 03/20/12 to see Dr. Cyndie Chime.  Will call us for problems or questions before then.

## 2012-03-20 ENCOUNTER — Telehealth: Payer: Self-pay | Admitting: Oncology

## 2012-03-20 ENCOUNTER — Ambulatory Visit (HOSPITAL_BASED_OUTPATIENT_CLINIC_OR_DEPARTMENT_OTHER): Payer: Medicare Other | Admitting: Oncology

## 2012-03-20 VITALS — BP 140/81 | HR 86 | Temp 97.9°F | Resp 18 | Ht 74.0 in | Wt 226.6 lb

## 2012-03-20 DIAGNOSIS — D472 Monoclonal gammopathy: Secondary | ICD-10-CM

## 2012-03-20 DIAGNOSIS — D4620 Refractory anemia with excess of blasts, unspecified: Secondary | ICD-10-CM

## 2012-03-20 DIAGNOSIS — D46Z Other myelodysplastic syndromes: Secondary | ICD-10-CM

## 2012-03-20 DIAGNOSIS — D61818 Other pancytopenia: Secondary | ICD-10-CM

## 2012-03-20 NOTE — Telephone Encounter (Signed)
Gave pt appt for January 2014 and February 2014 lab and MD  °

## 2012-03-20 NOTE — Patient Instructions (Addendum)
Repeat lab  monthly

## 2012-03-20 NOTE — Progress Notes (Signed)
Hematology and Oncology Follow Up Visit  Joel Macdonald [id number] 1930/05/14 78 y.o. 03/20/2012 6:14 PM   Principle Diagnosis: Encounter Diagnoses  Name Primary?  . MDS (myelodysplastic syndrome), low grade Yes  . 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 Bone marrow biopsy done 06/13/2011 showed mild dyserythropoiesis and dysmegakaryopoiesis. There were some focal aggregates of plasmacytoid lymphocytes. He had a mild elevation of serum IgM. Cytogenetic studies were normal. He has been followed with every two-month CBCs since that time. There does appear to be a trend for  deterioration in his counts. Most recent CBC done 12/6  with hemoglobin 11.3, MCV 100, total white count 1,900, there has been a fall in the neutrophils from previous values of 40-47% to the current 18% with rise in relative lymphocyte percent of 60. Platelets trending down currently 91,000. Monocytes staying around 20%.   Medications: reviewed  Allergies:  Allergies  Allergen Reactions  . Other Other (See Comments)    Ragweed causes nasal congestion    Review of Systems: Constitutional:  No constitutional symptoms  Respiratory:no cough or dyspnea Cardiovascular:  No chest pain or palpitations Gastrointestinal:no abdominal symptoms Genito-Urinary: not questioned Musculoskeletal:not questioned Neurologic:not questioned Skin: Remaining ROS negative.  Physical Exam: Blood pressure 140/81, pulse 86, temperature 97.9 F (36.6 C), temperature source Oral, resp. rate 18, height 6\' 2"  (1.88 m), weight 226 lb 9.6 oz (102.785 kg). Wt Readings from Last 3 Encounters:  03/20/12 226 lb 9.6 oz (102.785 kg)  09/20/11 226 lb (102.513 kg)  06/17/11 226 lb 6.4 oz (102.694 kg)     General appearance: well-nourished Caucasian man HENNT: pharynx no erythema  or exudate Lymph nodes: no adenopathy Breasts: Lungs:clear to auscultation resonant to percussion Heart:regular rhythm no murmur Abdomen:soft, nontender, no mass, questionable spleen tip. Extremities:no edema, no calf tenderness Vascular:no cyanosis Neurologic:no focal deficit Skin:no rash or ecchymosis  Lab Results: Lab Results  Component Value Date   WBC 1.9* 03/13/2012   HGB 11.3* 03/13/2012   HCT 33.5* 03/13/2012   MCV 100.0* 03/13/2012   PLT 91* 03/13/2012     Chemistry      Component Value Date/Time   NA 141 01/22/2007 2031   K 4.4 01/22/2007 2031   CL 104 01/22/2007 2031   CO2 24 01/22/2007 2031   BUN 19 01/22/2007 2031   CREATININE 0.92 01/22/2007 2031      Component Value Date/Time   CALCIUM 9.3 01/22/2007 2031   ALKPHOS 66 01/22/2007 2031   AST 16 01/22/2007 2031   ALT 19 01/22/2007 2031   BILITOT 0.8 01/22/2007 2031     Review of the peripheral blood:  Dimorphic population of red cells microcytic admixed with macrocytic; decreased total white cells, predominant cell is a mature lymphocyte; slightly decreased platelets; no blasts  Impression: Myelodysplastic syndrome Blood counts appear to be  evolving. I am concerned with the fall in the neutrophils. No obvious blasts on review of the peripheral blood film from December 6. However, I'm going to monitor his counts more closely on a monthly basis. I may need to do another bone marrow biopsy and we discussed this today.   CC:Dr. Hal Stoneking     Levert Feinstein, MD 12/13/20136:14 PM

## 2012-04-24 ENCOUNTER — Other Ambulatory Visit (HOSPITAL_BASED_OUTPATIENT_CLINIC_OR_DEPARTMENT_OTHER): Payer: Medicare Other

## 2012-04-24 ENCOUNTER — Telehealth: Payer: Self-pay | Admitting: *Deleted

## 2012-04-24 DIAGNOSIS — D61818 Other pancytopenia: Secondary | ICD-10-CM

## 2012-04-24 DIAGNOSIS — D4620 Refractory anemia with excess of blasts, unspecified: Secondary | ICD-10-CM

## 2012-04-24 DIAGNOSIS — D472 Monoclonal gammopathy: Secondary | ICD-10-CM

## 2012-04-24 LAB — MORPHOLOGY: PLT EST: DECREASED

## 2012-04-24 LAB — CBC WITH DIFFERENTIAL/PLATELET
BASO%: 0.2 % (ref 0.0–2.0)
Basophils Absolute: 0 10*3/uL (ref 0.0–0.1)
EOS%: 0.4 % (ref 0.0–7.0)
HCT: 32.5 % — ABNORMAL LOW (ref 38.4–49.9)
HGB: 11.4 g/dL — ABNORMAL LOW (ref 13.0–17.1)
LYMPH%: 54 % — ABNORMAL HIGH (ref 14.0–49.0)
MCH: 35.4 pg — ABNORMAL HIGH (ref 27.2–33.4)
MCHC: 35.2 g/dL (ref 32.0–36.0)
MONO#: 0.5 10*3/uL (ref 0.1–0.9)
NEUT%: 23.4 % — ABNORMAL LOW (ref 39.0–75.0)
Platelets: 97 10*3/uL — ABNORMAL LOW (ref 140–400)
lymph#: 1.1 10*3/uL (ref 0.9–3.3)

## 2012-04-24 LAB — CHCC SMEAR

## 2012-04-24 NOTE — Telephone Encounter (Signed)
Pt notified of lab results per Dr. Cyndie Chime & he does have monthly appts.

## 2012-04-24 NOTE — Telephone Encounter (Signed)
Message copied by Sabino Snipes on Fri Apr 24, 2012  6:15 PM ------      Message from: Levert Feinstein      Created: Fri Apr 24, 2012 10:41 AM       Call Dr [name] - counts  holding - almost identical to last month; continue to monitor on monthly basis for now

## 2012-05-22 ENCOUNTER — Ambulatory Visit (HOSPITAL_BASED_OUTPATIENT_CLINIC_OR_DEPARTMENT_OTHER): Payer: Medicare Other | Admitting: Oncology

## 2012-05-22 ENCOUNTER — Other Ambulatory Visit (HOSPITAL_BASED_OUTPATIENT_CLINIC_OR_DEPARTMENT_OTHER): Payer: Medicare Other | Admitting: Lab

## 2012-05-22 ENCOUNTER — Other Ambulatory Visit: Payer: Medicare Other | Admitting: Lab

## 2012-05-22 ENCOUNTER — Telehealth: Payer: Self-pay | Admitting: Oncology

## 2012-05-22 VITALS — BP 116/73 | HR 91 | Temp 98.4°F | Resp 20 | Ht 74.0 in | Wt 227.3 lb

## 2012-05-22 DIAGNOSIS — D4620 Refractory anemia with excess of blasts, unspecified: Secondary | ICD-10-CM

## 2012-05-22 DIAGNOSIS — D61818 Other pancytopenia: Secondary | ICD-10-CM

## 2012-05-22 DIAGNOSIS — D472 Monoclonal gammopathy: Secondary | ICD-10-CM

## 2012-05-22 LAB — CBC WITH DIFFERENTIAL/PLATELET
Eosinophils Absolute: 0 10*3/uL (ref 0.0–0.5)
MCV: 101 fL — ABNORMAL HIGH (ref 79.3–98.0)
MONO%: 19.4 % — ABNORMAL HIGH (ref 0.0–14.0)
NEUT#: 0.7 10*3/uL — ABNORMAL LOW (ref 1.5–6.5)
RBC: 3.01 10*6/uL — ABNORMAL LOW (ref 4.20–5.82)
RDW: 16.4 % — ABNORMAL HIGH (ref 11.0–14.6)
WBC: 2.5 10*3/uL — ABNORMAL LOW (ref 4.0–10.3)

## 2012-05-22 LAB — MORPHOLOGY

## 2012-05-22 LAB — CHCC SMEAR

## 2012-05-22 NOTE — Telephone Encounter (Signed)
Gave pt appt for lab and MD for April and June 2014

## 2012-05-22 NOTE — Progress Notes (Signed)
Hematology and Oncology Follow Up Visit  Joel Macdonald [id number] 04-Apr-1931 79 y.o. 05/22/2012 5:46 PM   Principle Diagnosis: Encounter Diagnoses  Name Primary?  . MDS (myelodysplastic syndrome), low grade Yes  . 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 Bone marrow biopsy done 06/13/2011 with mild dyserythropoiesis and this may be carried to wheezes. Small focal aggregates of plasmacytoid lymphocytes. No excess blasts. Normal cytogenetics. Cancer remained relatively stable over the last 12 months with some fluctuations in his white count and a trend for further decrease in his hemoglobin. Best counts obtained were the first visit here on 05/20/2011 when hemoglobin was 12.7, MCV  97, white count 4400 with 51% neutrophils 38 lymphocytes 10 monocytes and platelet count 127,000. Next count on record here from 06/13/2011 showed a dip in his white count to 2300. White count has remained in the range of 2300-2900 over the last 11 months. Platelet count ranging in the 90-107,000 range. Hemoglobin slowly drifting down to today's value of 10.5 with MCV 101. He has relative neutropenia with 18-30% neutrophils. Persistent monocytosis up to 23% of the differential.  Other than fatiguing easily he is asymptomatic and remains very active. He has had no interim infections.   Medications: reviewed  Allergies:  Allergies  Allergen Reactions  . Other Other (See Comments)    Ragweed causes nasal congestion    Review of Systems: Constitutional:   See above Respiratory: no cough or dyspnea  Cardiovascular:   No chest pain or palpitations Gastrointestinal: not questioned  Genito-Urinary:  not questioned Musculoskeletal: not questioned  Neurologic:  not questioned  Skin: no rash  Remaining ROS negative.  Physical Exam: Blood pressure 116/73, pulse 91, temperature 98.4 F (36.9 C), temperature source Oral, resp. rate 20, height 6\' 2"  (1.88 m), weight 227 lb 4.8 oz (103.103 kg). Wt Readings from Last 3 Encounters:  05/22/12 227 lb 4.8 oz (103.103 kg)  03/20/12 226 lb 9.6 oz (102.785 kg)  09/20/11 226 lb (102.513 kg)     General appearance:  well-nourished Caucasian man HENNT:  pharynx no erythema or exudate Lymph nodes:  no adenopathy Breasts: Lungs: clear to auscultation resonant to percussion  Heart: regular rhythm no murmur  Abdomen: soft, nontender, no mass, no organomegaly  Extremities: no edema, no calf tenderness  Vascular: no cyanosis  Neurologic: no focal deficit  Skin: no rash   Lab Results: Lab Results  Component Value Date   WBC 2.5* 05/22/2012   HGB 10.5* 05/22/2012   HCT 30.3* 05/22/2012   MCV 101.0* 05/22/2012   PLT 101* 05/22/2012     Chemistry      Component Value Date/Time   NA 141 01/22/2007 2031   K 4.4 01/22/2007 2031   CL 104 01/22/2007 2031   CO2 24 01/22/2007 2031   BUN 19 01/22/2007 2031   CREATININE 0.92 01/22/2007 2031      Component Value Date/Time   CALCIUM 9.3 01/22/2007 2031   ALKPHOS 66 01/22/2007 2031   AST 16 01/22/2007 2031   ALT 19 01/22/2007 2031   BILITOT 0.8 01/22/2007 2031       Impression and Plan: Intermediate prognosis myelodysplastic syndrome No major changes in his counts over the last year. I want to decrease frequency of lab testing to every other month. M.D. visit in 4  months.    CC:. Dr. Merlene Laughter   Levert Feinstein, MD 2/14/20145:46 PM

## 2012-05-22 NOTE — Patient Instructions (Signed)
Lab check every 2 months Visit with [facility] 6/13 @ 4:30

## 2012-05-23 ENCOUNTER — Other Ambulatory Visit: Payer: Self-pay

## 2012-07-10 ENCOUNTER — Telehealth: Payer: Self-pay | Admitting: *Deleted

## 2012-07-10 ENCOUNTER — Other Ambulatory Visit (HOSPITAL_BASED_OUTPATIENT_CLINIC_OR_DEPARTMENT_OTHER): Payer: Medicare Other | Admitting: Lab

## 2012-07-10 DIAGNOSIS — D61818 Other pancytopenia: Secondary | ICD-10-CM

## 2012-07-10 DIAGNOSIS — D472 Monoclonal gammopathy: Secondary | ICD-10-CM

## 2012-07-10 DIAGNOSIS — D4620 Refractory anemia with excess of blasts, unspecified: Secondary | ICD-10-CM

## 2012-07-10 DIAGNOSIS — D46Z Other myelodysplastic syndromes: Secondary | ICD-10-CM

## 2012-07-10 LAB — CBC WITH DIFFERENTIAL/PLATELET
BASO%: 0 % (ref 0.0–2.0)
EOS%: 0.5 % (ref 0.0–7.0)
HCT: 32.2 % — ABNORMAL LOW (ref 38.4–49.9)
LYMPH%: 54.7 % — ABNORMAL HIGH (ref 14.0–49.0)
MCH: 33.8 pg — ABNORMAL HIGH (ref 27.2–33.4)
MCHC: 33.2 g/dL (ref 32.0–36.0)
NEUT%: 23.1 % — ABNORMAL LOW (ref 39.0–75.0)
Platelets: 93 10*3/uL — ABNORMAL LOW (ref 140–400)
lymph#: 1.2 10*3/uL (ref 0.9–3.3)

## 2012-07-10 NOTE — Telephone Encounter (Signed)
Per Dr Reece Agar, counts are about the same as last time, informed pt.   He verbalized understanding.  SLJ

## 2012-07-14 LAB — IMMUNOFIXATION ELECTROPHORESIS
IgA: 124 mg/dL (ref 68–379)
IgM, Serum: 317 mg/dL — ABNORMAL HIGH (ref 41–251)

## 2012-09-14 ENCOUNTER — Ambulatory Visit (HOSPITAL_BASED_OUTPATIENT_CLINIC_OR_DEPARTMENT_OTHER): Payer: Medicare Other | Admitting: Oncology

## 2012-09-14 ENCOUNTER — Other Ambulatory Visit (HOSPITAL_BASED_OUTPATIENT_CLINIC_OR_DEPARTMENT_OTHER): Payer: Medicare Other

## 2012-09-14 VITALS — BP 145/90 | HR 80 | Temp 97.0°F | Resp 19 | Ht 74.0 in | Wt 226.1 lb

## 2012-09-14 DIAGNOSIS — D46Z Other myelodysplastic syndromes: Secondary | ICD-10-CM

## 2012-09-14 DIAGNOSIS — D4620 Refractory anemia with excess of blasts, unspecified: Secondary | ICD-10-CM

## 2012-09-14 DIAGNOSIS — D472 Monoclonal gammopathy: Secondary | ICD-10-CM

## 2012-09-14 DIAGNOSIS — C61 Malignant neoplasm of prostate: Secondary | ICD-10-CM

## 2012-09-14 LAB — MORPHOLOGY: PLT EST: DECREASED

## 2012-09-14 LAB — CBC WITH DIFFERENTIAL/PLATELET
Eosinophils Absolute: 0 10*3/uL (ref 0.0–0.5)
MONO#: 0.5 10*3/uL (ref 0.1–0.9)
MONO%: 19.6 % — ABNORMAL HIGH (ref 0.0–14.0)
NEUT#: 0.6 10*3/uL — ABNORMAL LOW (ref 1.5–6.5)
RBC: 3.06 10*6/uL — ABNORMAL LOW (ref 4.20–5.82)
RDW: 16.3 % — ABNORMAL HIGH (ref 11.0–14.6)
WBC: 2.3 10*3/uL — ABNORMAL LOW (ref 4.0–10.3)

## 2012-09-15 ENCOUNTER — Telehealth: Payer: Self-pay | Admitting: Oncology

## 2012-09-15 NOTE — Progress Notes (Signed)
Hematology and Oncology Follow Up Visit  Joel Macdonald [id number] 1931/01/04 79 y.o. 09/15/2012 7:23 AM   Principle Diagnosis: Encounter Diagnoses  Name Primary?  Marland Kitchen MGUS (monoclonal gammopathy of unknown significance)   . MDS (myelodysplastic syndrome), low grade Yes  . 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 Bone marrow biopsy 06/13/2011 showed mild dyserythropoiesis. There were small, focal aggregates of plasmacytoid lymphocytes but no increased plasma cells, no excess blasts. Cytogenetic studies were normal. Lab evaluation showed a concomitant IgM monoclonal gammopathy of undetermined significance.  We have now followed blood counts for over one year. Counts have remained relatively stable with some fluctuations. White counts have fluctuated between a low of 1900 and a high of 2900. Percent neutrophils between 18% and 47%. Absolute neutrophils have been consistently less than 1000 over the last 6 months. Unfortunately he has not had any problem with recurrent infections. Platelet count has stayed in the 90-110,000 range. Today's value is 88,000. Monocytes have remained at approximately 20% of the white count differential. Occasional myelocytes and metamyelocytes noted on the peripheral blood film but blasts have not been noted. Hemoglobin has been stable at 11 g.  His performance status remains good. He is active. He has had persistent ecchymoses on his arms and admits that he uses Alka-Seltzer on a regular basis.   Medications: reviewed  Allergies:  Allergies  Allergen Reactions  . Other Other (See Comments)    Ragweed causes nasal congestion    Review of Systems: Constitutional:   No constitutional symptoms Respiratory: No cough or dyspnea Cardiovascular:   No chest pain or palpitations Gastrointestinal: No abdominal pain or change in bowel habit. No hematochezia. Genito-Urinary: No hematuria. Musculoskeletal: No muscle bone or joint pain Neurologic: No headache or change in vision Skin: See above Occasional self-limited epistaxis. Remaining ROS negative.  Physical Exam: Blood pressure 145/90, pulse 80, temperature 97 F (36.1 C), temperature source Oral, resp. rate 19, height 6\' 2"  (1.88 m), weight 226 lb 1.6 oz (102.558 kg). Wt Readings from Last 3 Encounters:  09/14/12 226 lb 1.6 oz (102.558 kg)  05/22/12 227 lb 4.8 oz (103.103 kg)  03/20/12 226 lb 9.6 oz (102.785 kg)     General appearance: Well-nourished Caucasian man HENNT: Pharynx no erythema or exudate Lymph nodes: No adenopathy Breasts: Lungs: Clear to auscultation resonant to percussion Heart: Regular rhythm no murmur Abdomen: Soft, nontender, no mass, no organomegaly Extremities: No edema, no calf tenderness Musculoskeletal: No joint deformities GU: Vascular: No carotid bruits, no cyanosis Neurologic: Mental status intact, cranial nerves intact, motor strength 5 over 5, reflexes 1+ symmetric Skin: Scattered small ecchymoses on his arms  Lab Results: Lab Results  Component Value Date   WBC 2.3* 09/14/2012   HGB 11.0* 09/14/2012   HCT 31.1* 09/14/2012   MCV 101.5* 09/14/2012   PLT 88* 09/14/2012     Chemistry      Component Value Date/Time   NA 141 01/22/2007 2031   K 4.4 01/22/2007 2031   CL 104 01/22/2007 2031   CO2 24 01/22/2007 2031   BUN 19 01/22/2007 2031   CREATININE 0.92 01/22/2007 2031      Component Value Date/Time   CALCIUM 9.3 01/22/2007 2031   ALKPHOS 66 01/22/2007 2031   AST 16 01/22/2007 2031   ALT 19 01/22/2007 2031   BILITOT  0.8 01/22/2007 2031      Impression: #1., Intermediate-1 risk myelodysplastic syndrome. Counts overall stable over the last 12 months. No need for any intervention at this time. Plan: Continue to monitor counts on an  every other month basis.  #2. IgM monoclonal gammopathy of undetermined significance Protein levels remain stable through most recent value of 317 mg percent done on 07/10/2012 lab normal up to 251.     CC:. Dr. Merlene Laughter   Levert Feinstein, MD 6/10/20147:23 AM

## 2012-11-09 ENCOUNTER — Other Ambulatory Visit (HOSPITAL_BASED_OUTPATIENT_CLINIC_OR_DEPARTMENT_OTHER): Payer: Medicare Other

## 2012-11-09 DIAGNOSIS — D61818 Other pancytopenia: Secondary | ICD-10-CM

## 2012-11-09 DIAGNOSIS — D4620 Refractory anemia with excess of blasts, unspecified: Secondary | ICD-10-CM

## 2012-11-09 LAB — CBC WITH DIFFERENTIAL/PLATELET
BASO%: 0.4 % (ref 0.0–2.0)
Eosinophils Absolute: 0 10*3/uL (ref 0.0–0.5)
LYMPH%: 46.5 % (ref 14.0–49.0)
MCHC: 34.5 g/dL (ref 32.0–36.0)
MONO#: 0.5 10*3/uL (ref 0.1–0.9)
NEUT#: 1 10*3/uL — ABNORMAL LOW (ref 1.5–6.5)
Platelets: 110 10*3/uL — ABNORMAL LOW (ref 140–400)
RBC: 3.07 10*6/uL — ABNORMAL LOW (ref 4.20–5.82)
RDW: 16.5 % — ABNORMAL HIGH (ref 11.0–14.6)
WBC: 2.7 10*3/uL — ABNORMAL LOW (ref 4.0–10.3)
lymph#: 1.3 10*3/uL (ref 0.9–3.3)

## 2012-11-09 LAB — COMPREHENSIVE METABOLIC PANEL (CC13)
ALT: 19 U/L (ref 0–55)
CO2: 24 mEq/L (ref 22–29)
Calcium: 9.3 mg/dL (ref 8.4–10.4)
Chloride: 106 mEq/L (ref 98–109)
Creatinine: 1.2 mg/dL (ref 0.7–1.3)

## 2012-11-09 LAB — LACTATE DEHYDROGENASE (CC13): LDH: 180 U/L (ref 125–245)

## 2012-11-09 LAB — MORPHOLOGY: PLT EST: DECREASED

## 2012-11-11 ENCOUNTER — Other Ambulatory Visit: Payer: Self-pay

## 2012-11-11 ENCOUNTER — Telehealth: Payer: Self-pay | Admitting: *Deleted

## 2012-11-11 NOTE — Telephone Encounter (Signed)
Per Dr. Cyndie Chime; notified pt counts are stable c/w previous.  Pt verbalized understanding and expressed appreciation for call.

## 2012-11-11 NOTE — Telephone Encounter (Signed)
Message copied by Gala Romney on Wed Nov 11, 2012 11:08 AM ------      Message from: Levert Feinstein      Created: Tue Nov 10, 2012  8:41 PM       Call Dr [name] - counts are stable c/w previous ------

## 2013-01-04 ENCOUNTER — Other Ambulatory Visit (HOSPITAL_BASED_OUTPATIENT_CLINIC_OR_DEPARTMENT_OTHER): Payer: Medicare Other

## 2013-01-04 DIAGNOSIS — D4620 Refractory anemia with excess of blasts, unspecified: Secondary | ICD-10-CM

## 2013-01-04 DIAGNOSIS — D46Z Other myelodysplastic syndromes: Secondary | ICD-10-CM

## 2013-01-04 LAB — CBC WITH DIFFERENTIAL/PLATELET
BASO%: 0.4 % (ref 0.0–2.0)
EOS%: 1 % (ref 0.0–7.0)
Eosinophils Absolute: 0 10*3/uL (ref 0.0–0.5)
MCH: 35.6 pg — ABNORMAL HIGH (ref 27.2–33.4)
MCV: 102.8 fL — ABNORMAL HIGH (ref 79.3–98.0)
MONO%: 29 % — ABNORMAL HIGH (ref 0.0–14.0)
NEUT#: 0.7 10*3/uL — ABNORMAL LOW (ref 1.5–6.5)
RBC: 3.03 10*6/uL — ABNORMAL LOW (ref 4.20–5.82)
RDW: 16.1 % — ABNORMAL HIGH (ref 11.0–14.6)

## 2013-01-04 LAB — MORPHOLOGY

## 2013-02-11 ENCOUNTER — Other Ambulatory Visit: Payer: Self-pay

## 2013-03-01 ENCOUNTER — Other Ambulatory Visit (HOSPITAL_BASED_OUTPATIENT_CLINIC_OR_DEPARTMENT_OTHER): Payer: Medicare Other | Admitting: Lab

## 2013-03-01 DIAGNOSIS — D4620 Refractory anemia with excess of blasts, unspecified: Secondary | ICD-10-CM

## 2013-03-01 DIAGNOSIS — D61818 Other pancytopenia: Secondary | ICD-10-CM

## 2013-03-01 LAB — CBC WITH DIFFERENTIAL/PLATELET
EOS%: 0.4 % (ref 0.0–7.0)
MCH: 34.8 pg — ABNORMAL HIGH (ref 27.2–33.4)
MCHC: 33.8 g/dL (ref 32.0–36.0)
MCV: 103 fL — ABNORMAL HIGH (ref 79.3–98.0)
MONO%: 28 % — ABNORMAL HIGH (ref 0.0–14.0)
RBC: 2.94 10*6/uL — ABNORMAL LOW (ref 4.20–5.82)
RDW: 16.5 % — ABNORMAL HIGH (ref 11.0–14.6)

## 2013-03-01 LAB — MORPHOLOGY

## 2013-03-08 ENCOUNTER — Ambulatory Visit (HOSPITAL_BASED_OUTPATIENT_CLINIC_OR_DEPARTMENT_OTHER): Payer: Medicare Other | Admitting: Oncology

## 2013-03-08 ENCOUNTER — Telehealth: Payer: Self-pay | Admitting: Oncology

## 2013-03-08 VITALS — BP 132/73 | HR 87 | Temp 97.7°F | Resp 20 | Ht 74.0 in | Wt 228.5 lb

## 2013-03-08 DIAGNOSIS — C61 Malignant neoplasm of prostate: Secondary | ICD-10-CM

## 2013-03-08 DIAGNOSIS — D472 Monoclonal gammopathy: Secondary | ICD-10-CM

## 2013-03-08 DIAGNOSIS — D4620 Refractory anemia with excess of blasts, unspecified: Secondary | ICD-10-CM

## 2013-03-08 DIAGNOSIS — D61818 Other pancytopenia: Secondary | ICD-10-CM

## 2013-03-08 NOTE — Telephone Encounter (Signed)
per 12/1 POF appts made AVS and CAL given shh

## 2013-03-08 NOTE — Progress Notes (Signed)
Hematology and Oncology Follow Up Visit  Joel Macdonald [id number] 03/03/1931 79 y.o. 03/08/2013 3:29 PM   Principle Diagnosis: Encounter Diagnoses  Name Primary?  . MDS (myelodysplastic syndrome), low grade Yes  . Pancytopenia, acquired   . MGUS (monoclonal gammopathy of unknown significance)   . 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 Bone marrow biopsy 06/13/2011 showed mild dyserythropoiesis. There were small, focal aggregates of plasmacytoid lymphocytes but no increased plasma cells, no excess blasts. Cytogenetic studies were normal. Lab evaluation showed a concomitant IgM monoclonal gammopathy of undetermined significance. I have been monitoring blood counts every other month since first visit here in February 2013. Counts have remained  stable with some minor fluctuations. White count in the range of 1900-2900 with 30% neutrophils and up to 29% monocytes, hemoglobin 10-11, platelet count 79-100,000. He has not had any problems with recurrent infection. He did get his flu vaccine this season. We do see occasional myelocytes and metamyelocytes on peripheral blood film but no blasts. Review of blood film from 03/01/2013 similar to previous and I did not appreciate any blasts.  Medications: reviewed  Allergies:  Allergies  Allergen Reactions  . Other Other (See Comments)    Ragweed causes nasal congestion    Review of Systems: Hematology: No bleeding but persistent ecchymoses on his arms ENT ROS: No sore throat Breast ROS:  Respiratory ROS: No cough or dyspnea Cardiovascular ROS:  No chest pain or palpitations Gastrointestinal ROS:  No abdominal pain Genito-Urinary ROS: Not questioned Musculoskeletal ROS: No muscle or bone pain Neurological ROS no  headache or change in vision Dermatological ROS: Persistent ecchymoses Miscellaneous: Reports decreased ability to taste food. Remaining ROS negative  Physical Exam: Blood pressure 132/73, pulse 87, temperature 97.7 F (36.5 C), temperature source Oral, resp. rate 20, height 6\' 2"  (1.88 m), weight 228 lb 8 oz (103.647 kg). Wt Readings from Last 3 Encounters:  03/08/13 228 lb 8 oz (103.647 kg)  09/14/12 226 lb 1.6 oz (102.558 kg)  05/22/12 227 lb 4.8 oz (103.103 kg)     General appearance: Well-nourished Caucasian man HENNT: Pharynx no erythema, exudate, mass, or ulcer. No thyromegaly or thyroid nodules Lymph nodes: No cervical, supraclavicular, or axillary lymphadenopathy Breasts:  Lungs: Clear to auscultation, resonant to percussion throughout Heart: Regular rhythm, no murmur, no gallop, no rub, no click, no edema Abdomen: Soft, nontender, normal bowel sounds, no mass, no organomegaly Extremities: No edema, no calf tenderness Musculoskeletal: no joint deformities GU:  Vascular: Carotid pulses 2+, no bruits,  Neurologic: Alert, oriented, PERRLA, cranial nerves grossly normal, motor strength 5 over 5, reflexes 1+ symmetric, upper body coordination normal, gait normal, Skin: Multiple ecchymosis on the skin of his arms  Lab Results: CBC W/Diff  White count differential: 32% neutrophils 40% lymphocytes, 20% monocytes   Component Value Date/Time   WBC 2.9* 03/01/2013 1510   WBC 2.3* 06/13/2011 0800   RBC 2.94* 03/01/2013 1510   RBC 3.72* 06/13/2011 0800   HGB 10.2* 03/01/2013 1510   HGB 12.4* 06/13/2011 0800   HCT 30.3* 03/01/2013 1510   HCT 37.0* 06/13/2011 0800   PLT 79* 03/01/2013 1510   PLT 143* 06/13/2011 0800   MCV 103.0* 03/01/2013 1510   MCV 99.5 06/13/2011 0800   MCH 34.8* 03/01/2013 1510   MCH 33.3 06/13/2011 0800   MCHC 33.8  03/01/2013 1510   MCHC 33.5 06/13/2011 0800   RDW 16.5* 03/01/2013 1510   RDW 15.9* 06/13/2011 0800   LYMPHSABS 1.2 03/01/2013 1510   LYMPHSABS 1.2  06/13/2011 0800   MONOABS 0.8 03/01/2013 1510   MONOABS 0.2 06/13/2011 0800   EOSABS 0.0 03/01/2013 1510   EOSABS 0.0 06/13/2011 0800   BASOSABS 0.0 03/01/2013 1510   BASOSABS 0.0 06/13/2011 0800     Chemistry      Component Value Date/Time   NA 139 11/09/2012 1425   NA 141 01/22/2007 2031   K 4.2 11/09/2012 1425   K 4.4 01/22/2007 2031   CL 104 01/22/2007 2031   CO2 24 11/09/2012 1425   CO2 24 01/22/2007 2031   BUN 22.4 11/09/2012 1425   BUN 19 01/22/2007 2031   CREATININE 1.2 11/09/2012 1425   CREATININE 0.92 01/22/2007 2031      Component Value Date/Time   CALCIUM 9.3 11/09/2012 1425   CALCIUM 9.3 01/22/2007 2031   ALKPHOS 62 11/09/2012 1425   ALKPHOS 66 01/22/2007 2031   AST 20 11/09/2012 1425   AST 16 01/22/2007 2031   ALT 19 11/09/2012 1425   ALT 19 01/22/2007 2031   BILITOT 0.57 11/09/2012 1425   BILITOT 0.8 01/22/2007 2031       Impression:  #1., Intermediate-1 risk myelodysplastic syndrome.  Counts overall stable over the last 22 months. No need for any intervention at this time.  Plan: Continue to monitor counts on an every other month basis.   #2. IgM monoclonal gammopathy of undetermined significance  Protein levels remain stable through most recent value of 317 mg percent done on 07/10/2012 lab normal up to 251.    CC: Patient Care Team: Merlene Laughter, MD as PCP - General (Internal Medicine)   Levert Feinstein, MD 12/1/20143:29 PM

## 2013-05-17 ENCOUNTER — Telehealth: Payer: Self-pay | Admitting: *Deleted

## 2013-05-17 ENCOUNTER — Other Ambulatory Visit (HOSPITAL_BASED_OUTPATIENT_CLINIC_OR_DEPARTMENT_OTHER): Payer: Medicare Other

## 2013-05-17 DIAGNOSIS — D4620 Refractory anemia with excess of blasts, unspecified: Secondary | ICD-10-CM

## 2013-05-17 LAB — CBC WITH DIFFERENTIAL/PLATELET
BASO%: 0.3 % (ref 0.0–2.0)
BASOS ABS: 0 10*3/uL (ref 0.0–0.1)
EOS%: 0.7 % (ref 0.0–7.0)
Eosinophils Absolute: 0 10*3/uL (ref 0.0–0.5)
HCT: 31.5 % — ABNORMAL LOW (ref 38.4–49.9)
HEMOGLOBIN: 10.4 g/dL — AB (ref 13.0–17.1)
LYMPH#: 1.5 10*3/uL (ref 0.9–3.3)
LYMPH%: 49.5 % — ABNORMAL HIGH (ref 14.0–49.0)
MCH: 34.6 pg — AB (ref 27.2–33.4)
MCHC: 33 g/dL (ref 32.0–36.0)
MCV: 104.7 fL — ABNORMAL HIGH (ref 79.3–98.0)
MONO#: 0.8 10*3/uL (ref 0.1–0.9)
MONO%: 26.8 % — ABNORMAL HIGH (ref 0.0–14.0)
NEUT%: 22.7 % — ABNORMAL LOW (ref 39.0–75.0)
NEUTROS ABS: 0.7 10*3/uL — AB (ref 1.5–6.5)
Platelets: 84 10*3/uL — ABNORMAL LOW (ref 140–400)
RBC: 3.01 10*6/uL — ABNORMAL LOW (ref 4.20–5.82)
RDW: 16 % — AB (ref 11.0–14.6)
WBC: 3 10*3/uL — AB (ref 4.0–10.3)

## 2013-05-17 LAB — MORPHOLOGY: PLT EST: DECREASED

## 2013-05-17 NOTE — Telephone Encounter (Signed)
Per Dr. Beryle Beams; notified Dr. Tamala Julian that CBC - numbers no significant change from previous baseline.  Pt verbalized understanding and expressed appreciation for call.

## 2013-05-17 NOTE — Telephone Encounter (Signed)
Message copied by Domenic Schwab on Mon May 17, 2013  2:07 PM ------      Message from: Annia Belt      Created: Mon May 17, 2013  1:23 PM       Call [name]: CBC - numbers no significant change from previous baseline ------

## 2013-05-24 ENCOUNTER — Ambulatory Visit
Admission: RE | Admit: 2013-05-24 | Discharge: 2013-05-24 | Disposition: A | Discharge status: Home / Self Care | Payer: Medicare Other | Source: Ambulatory Visit | Attending: Internal Medicine | Admitting: Internal Medicine

## 2013-05-24 ENCOUNTER — Other Ambulatory Visit: Payer: Self-pay | Admitting: Internal Medicine

## 2013-05-24 DIAGNOSIS — R0989 Other specified symptoms and signs involving the circulatory and respiratory systems: Secondary | ICD-10-CM

## 2013-05-24 DIAGNOSIS — R609 Edema, unspecified: Secondary | ICD-10-CM

## 2013-05-24 DIAGNOSIS — R52 Pain, unspecified: Secondary | ICD-10-CM

## 2013-05-26 ENCOUNTER — Ambulatory Visit
Admission: RE | Admit: 2013-05-26 | Discharge: 2013-05-26 | Disposition: A | Discharge status: Home / Self Care | Payer: Medicare Other | Source: Ambulatory Visit | Attending: Internal Medicine | Admitting: Internal Medicine

## 2013-05-26 DIAGNOSIS — R52 Pain, unspecified: Secondary | ICD-10-CM

## 2013-05-26 DIAGNOSIS — R609 Edema, unspecified: Secondary | ICD-10-CM

## 2013-05-26 DIAGNOSIS — R0989 Other specified symptoms and signs involving the circulatory and respiratory systems: Secondary | ICD-10-CM

## 2013-05-27 ENCOUNTER — Other Ambulatory Visit: Payer: Medicare Other

## 2013-06-07 ENCOUNTER — Encounter: Payer: Self-pay | Admitting: Oncology

## 2013-07-05 ENCOUNTER — Ambulatory Visit (HOSPITAL_COMMUNITY)
Admission: RE | Admit: 2013-07-05 | Discharge: 2013-07-05 | Disposition: A | Discharge status: Home / Self Care | Payer: Medicare Other | Source: Ambulatory Visit | Attending: Geriatric Medicine | Admitting: Geriatric Medicine

## 2013-07-05 ENCOUNTER — Other Ambulatory Visit (HOSPITAL_COMMUNITY): Payer: Self-pay | Admitting: Geriatric Medicine

## 2013-07-05 DIAGNOSIS — M79605 Pain in left leg: Secondary | ICD-10-CM

## 2013-07-05 DIAGNOSIS — L03119 Cellulitis of unspecified part of limb: Secondary | ICD-10-CM

## 2013-07-05 DIAGNOSIS — M7989 Other specified soft tissue disorders: Secondary | ICD-10-CM

## 2013-07-05 DIAGNOSIS — L02419 Cutaneous abscess of limb, unspecified: Secondary | ICD-10-CM | POA: Insufficient documentation

## 2013-07-12 ENCOUNTER — Encounter: Payer: Self-pay | Admitting: Oncology

## 2013-07-12 ENCOUNTER — Other Ambulatory Visit: Payer: Self-pay | Admitting: Oncology

## 2013-07-12 ENCOUNTER — Ambulatory Visit (INDEPENDENT_AMBULATORY_CARE_PROVIDER_SITE_OTHER): Payer: Medicare Other | Admitting: Oncology

## 2013-07-12 ENCOUNTER — Inpatient Hospital Stay (HOSPITAL_COMMUNITY)
Admission: AD | Admit: 2013-07-12 | Discharge: 2013-07-14 | DRG: 603 | Disposition: A | Discharge status: Home / Self Care | Payer: Medicare Other | Source: Ambulatory Visit | Attending: Internal Medicine | Admitting: Internal Medicine

## 2013-07-12 ENCOUNTER — Encounter (HOSPITAL_COMMUNITY): Payer: Self-pay | Admitting: Internal Medicine

## 2013-07-12 VITALS — BP 155/80 | HR 84 | Temp 98.2°F | Resp 20 | Wt 221.7 lb

## 2013-07-12 DIAGNOSIS — S22080A Wedge compression fracture of T11-T12 vertebra, initial encounter for closed fracture: Secondary | ICD-10-CM

## 2013-07-12 DIAGNOSIS — D472 Monoclonal gammopathy: Secondary | ICD-10-CM

## 2013-07-12 DIAGNOSIS — D4620 Refractory anemia with excess of blasts, unspecified: Secondary | ICD-10-CM

## 2013-07-12 DIAGNOSIS — L03119 Cellulitis of unspecified part of limb: Principal | ICD-10-CM | POA: Diagnosis present

## 2013-07-12 DIAGNOSIS — M5430 Sciatica, unspecified side: Secondary | ICD-10-CM

## 2013-07-12 DIAGNOSIS — M545 Low back pain, unspecified: Secondary | ICD-10-CM

## 2013-07-12 DIAGNOSIS — D46Z Other myelodysplastic syndromes: Secondary | ICD-10-CM

## 2013-07-12 DIAGNOSIS — D61818 Other pancytopenia: Secondary | ICD-10-CM

## 2013-07-12 DIAGNOSIS — K1370 Unspecified lesions of oral mucosa: Secondary | ICD-10-CM

## 2013-07-12 DIAGNOSIS — K219 Gastro-esophageal reflux disease without esophagitis: Secondary | ICD-10-CM | POA: Diagnosis present

## 2013-07-12 DIAGNOSIS — I10 Essential (primary) hypertension: Secondary | ICD-10-CM

## 2013-07-12 DIAGNOSIS — I998 Other disorder of circulatory system: Secondary | ICD-10-CM

## 2013-07-12 DIAGNOSIS — S22009A Unspecified fracture of unspecified thoracic vertebra, initial encounter for closed fracture: Secondary | ICD-10-CM

## 2013-07-12 DIAGNOSIS — Z79899 Other long term (current) drug therapy: Secondary | ICD-10-CM

## 2013-07-12 DIAGNOSIS — Z8546 Personal history of malignant neoplasm of prostate: Secondary | ICD-10-CM

## 2013-07-12 DIAGNOSIS — K2100 Gastro-esophageal reflux disease with esophagitis, without bleeding: Secondary | ICD-10-CM

## 2013-07-12 DIAGNOSIS — D469 Myelodysplastic syndrome, unspecified: Secondary | ICD-10-CM

## 2013-07-12 DIAGNOSIS — J309 Allergic rhinitis, unspecified: Secondary | ICD-10-CM

## 2013-07-12 DIAGNOSIS — M25559 Pain in unspecified hip: Secondary | ICD-10-CM

## 2013-07-12 DIAGNOSIS — C61 Malignant neoplasm of prostate: Secondary | ICD-10-CM

## 2013-07-12 DIAGNOSIS — Z9849 Cataract extraction status, unspecified eye: Secondary | ICD-10-CM

## 2013-07-12 DIAGNOSIS — Z87891 Personal history of nicotine dependence: Secondary | ICD-10-CM

## 2013-07-12 DIAGNOSIS — L02419 Cutaneous abscess of limb, unspecified: Secondary | ICD-10-CM

## 2013-07-12 DIAGNOSIS — C4490 Unspecified malignant neoplasm of skin, unspecified: Secondary | ICD-10-CM

## 2013-07-12 DIAGNOSIS — L03116 Cellulitis of left lower limb: Secondary | ICD-10-CM

## 2013-07-12 DIAGNOSIS — L03115 Cellulitis of right lower limb: Secondary | ICD-10-CM

## 2013-07-12 DIAGNOSIS — D709 Neutropenia, unspecified: Secondary | ICD-10-CM | POA: Diagnosis present

## 2013-07-12 HISTORY — DX: Unspecified osteoarthritis, unspecified site: M19.90

## 2013-07-12 HISTORY — DX: Gastro-esophageal reflux disease without esophagitis: K21.9

## 2013-07-12 HISTORY — DX: Malignant neoplasm of prostate: C61

## 2013-07-12 HISTORY — DX: Cardiac murmur, unspecified: R01.1

## 2013-07-12 HISTORY — DX: Other disorder of circulatory system: I99.8

## 2013-07-12 HISTORY — DX: Cellulitis of right lower limb: L03.115

## 2013-07-12 LAB — COMPLETE METABOLIC PANEL WITH GFR
ALK PHOS: 62 U/L (ref 39–117)
ALT: 16 U/L (ref 0–53)
AST: 19 U/L (ref 0–37)
Albumin: 3.3 g/dL — ABNORMAL LOW (ref 3.5–5.2)
BUN: 21 mg/dL (ref 6–23)
CO2: 23 mEq/L (ref 19–32)
CREATININE: 0.94 mg/dL (ref 0.50–1.35)
Calcium: 8.9 mg/dL (ref 8.4–10.5)
Chloride: 102 mEq/L (ref 96–112)
GFR, Est African American: 88 mL/min
GFR, Est Non African American: 76 mL/min
Glucose, Bld: 94 mg/dL (ref 70–99)
Potassium: 4.9 mEq/L (ref 3.5–5.3)
Sodium: 140 mEq/L (ref 135–145)
Total Bilirubin: 0.6 mg/dL (ref 0.3–1.2)
Total Protein: 6.4 g/dL (ref 6.0–8.3)

## 2013-07-12 LAB — CBC WITH DIFFERENTIAL/PLATELET
BASOS ABS: 0 10*3/uL (ref 0.0–0.1)
Basophils Relative: 0 % (ref 0–1)
Eosinophils Absolute: 0 10*3/uL (ref 0.0–0.7)
Eosinophils Relative: 1 % (ref 0–5)
HCT: 27.7 % — ABNORMAL LOW (ref 39.0–52.0)
HEMOGLOBIN: 9.2 g/dL — AB (ref 13.0–17.0)
LYMPHS ABS: 1.1 10*3/uL (ref 0.7–4.0)
Lymphocytes Relative: 28 % (ref 12–46)
MCH: 34.5 pg — ABNORMAL HIGH (ref 26.0–34.0)
MCHC: 33.2 g/dL (ref 30.0–36.0)
MCV: 103.7 fL — ABNORMAL HIGH (ref 78.0–100.0)
Monocytes Absolute: 2 10*3/uL — ABNORMAL HIGH (ref 0.1–1.0)
Monocytes Relative: 52 % — ABNORMAL HIGH (ref 3–12)
NEUTROS ABS: 0.7 10*3/uL — AB (ref 1.7–7.7)
Neutrophils Relative %: 19 % — ABNORMAL LOW (ref 43–77)
Platelets: 116 10*3/uL — ABNORMAL LOW (ref 150–400)
RBC: 2.67 MIL/uL — ABNORMAL LOW (ref 4.22–5.81)
RDW: 15.1 % (ref 11.5–15.5)
WBC: 3.8 10*3/uL — AB (ref 4.0–10.5)

## 2013-07-12 LAB — CREATININE, SERUM
Creatinine, Ser: 0.95 mg/dL (ref 0.50–1.35)
GFR calc Af Amer: 88 mL/min — ABNORMAL LOW (ref >90)
GFR calc non Af Amer: 76 mL/min — ABNORMAL LOW (ref >90)

## 2013-07-12 LAB — CBC
HEMATOCRIT: 24.5 % — AB (ref 39.0–52.0)
HEMOGLOBIN: 8.4 g/dL — AB (ref 13.0–17.0)
MCH: 35.4 pg — AB (ref 26.0–34.0)
MCHC: 34.3 g/dL (ref 30.0–36.0)
MCV: 103.4 fL — AB (ref 78.0–100.0)
Platelets: 103 10*3/uL — ABNORMAL LOW (ref 150–400)
RBC: 2.37 MIL/uL — ABNORMAL LOW (ref 4.22–5.81)
RDW: 15.2 % (ref 11.5–15.5)
WBC: 3 10*3/uL — ABNORMAL LOW (ref 4.0–10.5)

## 2013-07-12 LAB — SAVE SMEAR

## 2013-07-12 LAB — LACTATE DEHYDROGENASE: LDH: 238 U/L (ref 94–250)

## 2013-07-12 MED ORDER — ACETAMINOPHEN 325 MG PO TABS: 650.0000 mg | ORAL_TABLET | Freq: Four times a day (QID) | ORAL | Status: DC | PRN

## 2013-07-12 MED ORDER — PANTOPRAZOLE SODIUM 40 MG PO TBEC
40.0000 mg | DELAYED_RELEASE_TABLET | Freq: Every day | ORAL | Status: DC
  Administered 2013-07-12 – 2013-07-14 (×3): 40 mg via ORAL
  Filled 2013-07-12 (×3): qty 1

## 2013-07-12 MED ORDER — SODIUM CHLORIDE 0.9 % IJ SOLN: 3.0000 mL | INTRAMUSCULAR | Status: DC | PRN

## 2013-07-12 MED ORDER — VANCOMYCIN HCL 10 G IV SOLR
1500.0000 mg | Freq: Once | INTRAVENOUS | Status: AC
  Administered 2013-07-12: 1500 mg via INTRAVENOUS
  Filled 2013-07-12: qty 1500

## 2013-07-12 MED ORDER — SODIUM CHLORIDE 0.9 % IJ SOLN: 3.0000 mL | Freq: Two times a day (BID) | INTRAMUSCULAR | Status: DC

## 2013-07-12 MED ORDER — ACETAMINOPHEN 650 MG RE SUPP: 650.0000 mg | Freq: Four times a day (QID) | RECTAL | Status: DC | PRN

## 2013-07-12 MED ORDER — ONDANSETRON HCL 4 MG PO TABS: 4.0000 mg | ORAL_TABLET | Freq: Four times a day (QID) | ORAL | Status: DC | PRN

## 2013-07-12 MED ORDER — POLYETHYLENE GLYCOL 3350 17 G PO PACK
17.0000 g | PACK | Freq: Every day | ORAL | Status: DC | PRN
  Filled 2013-07-12: qty 1

## 2013-07-12 MED ORDER — SODIUM CHLORIDE 0.9 % IV SOLN: 250.0000 mL | INTRAVENOUS | Status: DC | PRN

## 2013-07-12 MED ORDER — VANCOMYCIN HCL IN DEXTROSE 1-5 GM/200ML-% IV SOLN
1000.0000 mg | Freq: Two times a day (BID) | INTRAVENOUS | Status: DC
Start: 2013-07-13 — End: 2013-07-14
  Administered 2013-07-13 – 2013-07-14 (×3): 1000 mg via INTRAVENOUS
  Filled 2013-07-12 (×4): qty 200

## 2013-07-12 MED ORDER — ALUM & MAG HYDROXIDE-SIMETH 200-200-20 MG/5ML PO SUSP: 30.0000 mL | Freq: Four times a day (QID) | ORAL | Status: DC | PRN

## 2013-07-12 MED ORDER — IBUPROFEN 400 MG PO TABS
400.0000 mg | ORAL_TABLET | Freq: Four times a day (QID) | ORAL | Status: DC | PRN
  Administered 2013-07-12 – 2013-07-13 (×2): 400 mg via ORAL
  Filled 2013-07-12 (×4): qty 1

## 2013-07-12 MED ORDER — HEPARIN SODIUM (PORCINE) 5000 UNIT/ML IJ SOLN
5000.0000 [IU] | Freq: Three times a day (TID) | INTRAMUSCULAR | Status: DC
  Administered 2013-07-12 – 2013-07-14 (×5): 5000 [IU] via SUBCUTANEOUS
  Filled 2013-07-12 (×8): qty 1

## 2013-07-12 MED ORDER — HYDROCODONE-ACETAMINOPHEN 5-325 MG PO TABS: 1.0000 | ORAL_TABLET | ORAL | Status: DC | PRN

## 2013-07-12 MED ORDER — ONDANSETRON HCL 4 MG/2ML IJ SOLN: 4.0000 mg | Freq: Four times a day (QID) | INTRAMUSCULAR | Status: DC | PRN

## 2013-07-12 NOTE — Progress Notes (Signed)
NURSING PROGRESS NOTE  Joel Macdonald [id number] Admission Data: 07/12/2013 5:33 PM Attending Provider: Charlynne Cousins, MD OQH:[id number],TWS Marcello Moores, MD Code Status full   Joel Macdonald is a 80 y.o. male patient admitted from ED:  -No acute distress noted.  -No complaints of shortness of breath.  -No complaints of chest pain.    Blood pressure 151/83, pulse 86, temperature 99 F (37.2 C), temperature source Oral, resp. rate 20, height 6' (1.829 m), weight 100.562 kg (221 lb 11.2 oz), SpO2 100.00%.   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  Past Surgical History:   has no past surgical history on file.  Social History:   reports that he quit smoking about 20 years ago. He does not have any smokeless tobacco history on file.  Skin: cellulitis LLE  Patient/Family orientated to room. Information packet given to patient/family. Admission inpatient armband information verified with patient/family to include name and date of birth and placed on patient arm. Side rails up x 2, fall assessment and education completed with patient/family. Patient/family able to verbalize understanding of risk associated with falls and verbalized understanding to call for assistance before getting out of bed. Call light within reach. Patient/family able to voice and demonstrate understanding of unit orientation instructions.    Will continue to evaluate and treat per MD order.

## 2013-07-12 NOTE — Patient Instructions (Addendum)
Return visit 4/27 @ 9 AM  30 minutes Lab today then every 2 months I am happy to discuss management of leg cellulitis with Dr Felipa Eth We will forward today's lab results to him

## 2013-07-12 NOTE — Progress Notes (Signed)
ANTIBIOTIC CONSULT NOTE - INITIAL  Pharmacy Consult for vancomycin Indication: cellulitis  Allergies  Allergen Reactions  . Other Other (See Comments)    Ragweed causes nasal congestion    Patient Measurements: Height: 6' (182.9 cm) Weight: 221 lb 11.2 oz (100.562 kg) IBW/kg (Calculated) : 77.6  Vital Signs: Temp: 99 F (37.2 C) (04/06 1723) Temp src: Oral (04/06 1723) BP: 151/83 mmHg (04/06 1723) Pulse Rate: 86 (04/06 1723) Intake/Output from previous day:   Intake/Output from this shift:    Labs:  Recent Labs  07/12/13 0914  WBC 3.8*  HGB 9.2*  PLT 116*  CREATININE 0.94   Estimated Creatinine Clearance: 75.7 ml/min (by C-G formula based on Cr of 0.94). No results found for this basename: VANCOTROUGH, VANCOPEAK, VANCORANDOM, GENTTROUGH, GENTPEAK, GENTRANDOM, TOBRATROUGH, TOBRAPEAK, TOBRARND, AMIKACINPEAK, AMIKACINTROU, AMIKACIN,  in the last 72 hours   Microbiology: No results found for this or any previous visit (from the past 720 hour(s)).  Medical History: Past Medical History  Diagnosis Date  . Pancytopenia, acquired 06/17/2011  . MGUS (monoclonal gammopathy of unknown significance) 06/17/2011  . MDS (myelodysplastic syndrome), low grade 09/20/2011    Bone marrow bx 06/13/11: mild dyserythro/dymegakaryopoiesis.  Normal cytogenetics.  FISH negative for chrom 5 or 7 deletions.  Scattered small infiltrates of plasmacytoid lymphs  . Cellulitis of leg, right 07/12/2013  . Ischemia of extremity 07/12/2013    Bilateral toes  2,3,4 right; 1,3 left  Vascular doppler normal for large vessel occlusion 2/15    Medications:  Prescriptions prior to admission  Medication Sig Dispense Refill  . amoxicillin-clavulanate (AUGMENTIN) 875-125 MG per tablet Take 1 tablet by mouth 2 (two) times daily.      Marland Kitchen aspirin-sod bicarb-citric acid (ALKA-SELTZER) 325 MG TBEF Take 650 mg by mouth every 6 (six) hours as needed.       . clobetasol ointment (TEMOVATE) 0.05 %       . doxycycline  (VIBRAMYCIN) 100 MG capsule Take 100 mg by mouth 2 (two) times daily.      Marland Kitchen ibuprofen (ADVIL,MOTRIN) 200 MG tablet Take 400 mg by mouth every 6 (six) hours as needed. Takes 400-600mg       . ranitidine (ZANTAC) 150 MG tablet Take 150 mg by mouth as needed.       Scheduled:  . heparin  5,000 Units Subcutaneous 3 times per day  . pantoprazole  40 mg Oral Daily  . sodium chloride  3 mL Intravenous Q12H    Assessment: 80 yo male w/ LLE cellulitis and has failed outpatient therapy with augmentin and doxycycline. Pharmacy has been consulted to dose vancomycin.  WBC= 3.8, afebrile, SCr= 0.94 and CrCl ~ 75.  4/6 vanc>>  4/6 blood x2  Goal of Therapy:  Vancomycin trough level 10-15 mcg/ml  Plan:  -Vancomycin 1500mg  IV x1 followed by 1000mg  IV q12h -Will follow renal function, cultures and clinical progress  Hildred Laser, Pharm D 07/12/2013 6:59 PM

## 2013-07-12 NOTE — H&P (Signed)
[facility] History and Physical  Joel Macdonald QMG:[id number] DOB: January 26, 1931 DOA: 07/12/2013  Referring physician: Dr. Felipa Eth PCP: Mathews Argyle, MD   Chief Complaint: leg swelling  HPI: Joel Macdonald is a 80 y.o. male  Past medical history of MGUS and MDS, that has been treated with several courses of antibiotic including doxycycline, Augmentin for a left lower extremity cellulitis. Who was referred by his PCP due to no improvement of his c cellulitis. He relates it's been red warm and tender to touch for the past 2 weeks. He relates pustule. That have recently burst continues to be swollen. He relates seems to be trending approximately. He relates no fever chills nausea vomiting or diarrhea. Lower extremity Doppler was negative for DVT.   Review of Systems:  Constitutional:  No weight loss, night sweats, Fevers, chills, fatigue.  HEENT:  No headaches, Difficulty swallowing,Tooth/dental problems,Sore throat,  No sneezing, itching, ear ache, nasal congestion, post nasal drip,  Cardio-vascular:  No chest pain, Orthopnea, PND, swelling in lower extremities, anasarca, dizziness, palpitations  GI:  No heartburn, indigestion, abdominal pain, nausea, vomiting, diarrhea, change in bowel habits, loss of appetite  Resp:  No shortness of breath with exertion or at rest. No excess mucus, no productive cough, No non-productive cough, No coughing up of blood.No change in color of mucus.No wheezing.No chest wall deformity   GU:  no dysuria, change in color of urine, no urgency or frequency. No flank pain.  Musculoskeletal:  No joint pain or swelling. No decreased range of motion. No back pain.  Psych:  No change in mood or affect. No depression or anxiety. No memory loss.   Past Medical History  Diagnosis Date  . Pancytopenia, acquired 06/17/2011  . MGUS (monoclonal gammopathy of unknown significance) 06/17/2011  . MDS (myelodysplastic syndrome), low grade 09/20/2011    Bone  marrow bx 06/13/11: mild dyserythro/dymegakaryopoiesis.  Normal cytogenetics.  FISH negative for chrom 5 or 7 deletions.  Scattered small infiltrates of plasmacytoid lymphs  . Cellulitis of leg, right 07/12/2013  . Ischemia of extremity 07/12/2013    Bilateral toes  2,3,4 right; 1,3 left  Vascular doppler normal for large vessel occlusion 2/15   History reviewed. No pertinent past surgical history. Social History:  reports that he quit smoking about 20 years ago. He does not have any smokeless tobacco history on file. He reports that he drinks alcohol. His drug history is not on file.  Allergies  Allergen Reactions  . Other Other (See Comments)    Ragweed causes nasal congestion    Family History  Problem Relation Age of Onset  . Other Mother   . Other Father      Prior to Admission medications   Medication Sig Start Date End Date Taking? Authorizing Provider  amoxicillin-clavulanate (AUGMENTIN) 875-125 MG per tablet Take 1 tablet by mouth 2 (two) times daily.    Hal Stoneking, MD  aspirin-sod bicarb-citric acid (ALKA-SELTZER) 325 MG TBEF Take 650 mg by mouth every 6 (six) hours as needed.     Historical Provider, MD  clobetasol ointment (TEMOVATE) 0.05 %  06/18/11   Historical Provider, MD  doxycycline (VIBRAMYCIN) 100 MG capsule Take 100 mg by mouth 2 (two) times daily.    Hal Stoneking, MD  ibuprofen (ADVIL,MOTRIN) 200 MG tablet Take 400 mg by mouth every 6 (six) hours as needed. Takes 400-600mg     Historical Provider, MD  ranitidine (ZANTAC) 150 MG tablet Take 150 mg by mouth as needed.    Historical Provider,  MD   Physical Exam: Filed Vitals:   07/12/13 1723  BP: 151/83  Pulse: 86  Temp: 99 F (37.2 C)  Resp: 20    BP 151/83  Pulse 86  Temp(Src) 99 F (37.2 C) (Oral)  Resp 20  Ht 6' (1.829 m)  Wt 100.562 kg (221 lb 11.2 oz)  BMI 30.06 kg/m2  SpO2 100%  General:  Appears calm and comfortable Eyes: PERRL, normal lids, irises & conjunctiva ENT: grossly normal hearing,  lips & tongue Neck: no LAD, masses or thyromegaly Cardiovascular: RRR, no m/r/g. No LE edema. Respiratory: CTA bilaterally, no w/r/r. Normal respiratory effort. Abdomen: soft, ntnd Skin: The left lower extremity is swollen red and warm and mildly tender to touch. I have removed the bandages and there was some crusty purulence. Musculoskeletal: grossly normal tone BUE/BLE Psychiatric: grossly normal mood and affect, speech fluent and appropriate Neurologic: grossly non-focal.          Labs on Admission:  Basic Metabolic Panel:  Recent Labs Lab 07/12/13 0914  NA 140  K 4.9  CL 102  CO2 23  GLUCOSE 94  BUN 21  CREATININE 0.94  CALCIUM 8.9   Liver Function Tests:  Recent Labs Lab 07/12/13 0914  AST 19  ALT 16  ALKPHOS 62  BILITOT 0.6  PROT 6.4  ALBUMIN 3.3*   No results found for this basename: LIPASE, AMYLASE,  in the last 168 hours No results found for this basename: AMMONIA,  in the last 168 hours CBC:  Recent Labs Lab 07/12/13 0914  WBC 3.8*  NEUTROABS 0.7*  HGB 9.2*  HCT 27.7*  MCV 103.7*  PLT 116*   Cardiac Enzymes: No results found for this basename: CKTOTAL, CKMB, CKMBINDEX, TROPONINI,  in the last 168 hours  BNP (last 3 results) No results found for this basename: PROBNP,  in the last 8760 hours CBG: No results found for this basename: GLUCAP,  in the last 168 hours  Radiological Exams on Admission: No results found.  EKG: Independently reviewed. none  Assessment/Plan Cellulitis of leg, left: - Has failed outpatient antibiotic therapy for the past 2 weeks, I think his MGUS and probably his MDS is playing a role on him in a fight this infection. I will go ahead and start him on IV vancomycin. - I have marked the area to see if there is improvement. Speaking with doctors Maury I think it's prudent to go ahead and order an MRI to rule out any deep tissue infection. - Check blood cultures is highly unlikely they will be positive as he has  been on ongoing antibiotic for the last 2 weeks.  MGUS (monoclonal gammopathy of unknown significance)/ MDS (myelodysplastic syndrome), low grade - Stable.   Code Status: full Family Communication: none Disposition Plan: inpatient  Time spent: 34 minutes  Charlynne Cousins [facility] Pager [phone]

## 2013-07-12 NOTE — Progress Notes (Signed)
Patient ID: Joel Macdonald, male   DOB: [birth year], 80 y.o.   MRN: [id number] Hematology and Oncology Follow Up Visit  Joel Macdonald [id number] 1930-12-28 80 y.o. 07/12/2013 9:50 AM   Principle Diagnosis: Encounter Diagnoses  Name Primary?  . Cellulitis of leg, left Yes  . MDS (myelodysplastic syndrome), low grade   . MGUS (monoclonal gammopathy of unknown significance)   . 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 Bone marrow biopsy 06/13/2011 showed mild dyserythropoiesis. There were small, focal aggregates of plasmacytoid lymphocytes but no increased plasma cells, no excess blasts. Cytogenetic studies were normal. Lab evaluation showed a concomitant IgM monoclonal gammopathy of undetermined significance.  I have been monitoring blood counts every other month since first visit here in February 2013. Counts have remained stable with some minor fluctuations. White count in the range of 1900-2900 with 30% neutrophils and up to 29% monocytes, hemoglobin 10-11, platelet count 79-100,000. Today's labs are pending. Up until now he has not had any problems with recurrent infection. However, since his visit with me in December 2014, he has had a number of infectious problems. He had a flulike illness with bronchitis at the end of December treated with oral antibiotics with no improvement. That was the peak time that the flu was active  in our community and I suspect that this was a viral illness. He has been getting recurrent oral viral lesions along the gumline and buccal mucosa. He has not taken treatment for these. He was using some Magic mouthwash.  About 6-8 weeks ago he developed an area of inflammation and swelling of his right ankle and was diagnosed with cellulitis.  He was treated with outpatient Augmentin for 10 days with resolution. Unfortunately, he developed recurrent swelling and erythema of his contralateral left leg about one week ago. He was initially started on Augmentin but was not improving and doxycycline was added. He had both arterial and venous Doppler studies which did not show any evidence for thrombosis. He has noted some ischemic changes of his toes which prompted the arterial evaluation. Venous evaluation done on 05/24/2013 on the right leg and on March 30 on the left leg. Noninvasive Arterial studies done on February 18 on both lower extremities.  He has had intermittent fever up to 101 and at least 2 episodes of shaking chills. He is not feel that the left leg is improving much. He has a followup with his internist this afternoon.    Medications: reviewed  Allergies:  Allergies  Allergen Reactions  . Other Other (See Comments)    Ragweed causes nasal congestion    Review of Systems: Hematology:  No bleeding or bruising ENT ROS: See above Breast ROS:  Respiratory ROS: Resolved cough. No dyspnea Cardiovascular ROS:  No ischemic chest pain or palpitations. Positive bilateral pedal edema left greater than right no prior cardiac history. No history of congestive heart failure. Gastrointestinal ROS: No abdominal pain or change in bowel habit. GI intolerance to azithromycin which he took back in December for bronchitis   Genito-Urinary ROS: He continues annual followup of his prostate cancer with Dr. Gaynelle Arabian. Musculoskeletal ROS: No muscle bone or joint pain Neurological ROS: No headache or change in vision Dermatological ROS: No rash. Skin changes of cellulitis left lower extremity. Remaining ROS negative:   Physical  Exam: Blood pressure 155/80, pulse 84, temperature 98.2 F (36.8 C), temperature source Oral, resp. rate 20, weight 221 lb 11.2 oz (100.562 kg), SpO2 100.00%. Wt Readings from Last 3 Encounters:  07/12/13 221 lb  11.2 oz (100.562 kg)  03/08/13 228 lb 8 oz (103.647 kg)  09/14/12 226 lb 1.6 oz (102.558 kg)     General appearance: Well-nourished Caucasian man HENNT: Pharynx no erythema, exudate, mass, or ulcer. No thyromegaly or thyroid nodules Lymph nodes: No cervical, supraclavicular, or axillary lymphadenopathy Breasts: Lungs: Clear to auscultation, resonant to percussion throughout Heart: Regular rhythm, no murmur, no gallop, no rub, no click, no edema Abdomen: Soft, nontender, normal bowel sounds, no mass,  the spleen is now palpable about 2 cm well the left costal margin. Smooth edge. Extremities: Bilateral venous stasis changes. Trace edema on the right lower extremity. 2+ pedal edema extending to the knee on the left. Musculoskeletal: no joint deformities GU:  Vascular: Carotid pulses 2+, no bruits, distal pulses: Dorsalis pedis nonpalpable secondary to bilateral pedal edema. There are ischemic changes of the distal toes numbers 2, 3, and 4 on the right and numbers one, and 3 on the left. Neurologic: Alert, oriented, PERRLA,  cranial nerves grossly normal, motor strength 5 over 5, reflexes 1+ symmetric, upper body coordination normal, gait normal, Skin: Extensive erythema over the left tibial area with some areas of superficial skin breakdown and serous exudate  Lab Results: CBC W/Diff    Component Value Date/Time   WBC 3.0* 05/17/2013 1258   WBC 2.3* 06/13/2011 0800   RBC 3.01* 05/17/2013 1258   RBC 3.72* 06/13/2011 0800   HGB 10.4* 05/17/2013 1258   HGB 12.4* 06/13/2011 0800   HCT 31.5* 05/17/2013 1258   HCT 37.0* 06/13/2011 0800   PLT 84* 05/17/2013 1258   PLT 143* 06/13/2011 0800   MCV 104.7* 05/17/2013 1258   MCV 99.5 06/13/2011 0800   MCH 34.6* 05/17/2013 1258   MCH 33.3 06/13/2011 0800   MCHC 33.0 05/17/2013 1258   MCHC 33.5 06/13/2011 0800   RDW 16.0* 05/17/2013 1258   RDW 15.9* 06/13/2011 0800   LYMPHSABS 1.5 05/17/2013 1258   LYMPHSABS 1.2 06/13/2011 0800   MONOABS 0.8 05/17/2013 1258   MONOABS 0.2 06/13/2011  0800   EOSABS 0.0 05/17/2013 1258   EOSABS 0.0 06/13/2011 0800   BASOSABS 0.0 05/17/2013 1258   BASOSABS 0.0 06/13/2011 0800     Chemistry      Component Value Date/Time   NA 139 11/09/2012 1425   NA 141 01/22/2007 2031   K 4.2 11/09/2012 1425   K 4.4 01/22/2007 2031   CL 104 01/22/2007 2031   CO2 24 11/09/2012 1425   CO2 24 01/22/2007 2031   BUN 22.4 11/09/2012 1425   BUN 19 01/22/2007 2031   CREATININE 1.2 11/09/2012 1425   CREATININE 0.92 01/22/2007 2031      Component Value Date/Time   CALCIUM 9.3 11/09/2012 1425   CALCIUM 9.3 01/22/2007 2031   ALKPHOS 62 11/09/2012 1425   ALKPHOS 66 01/22/2007 2031   AST 20 11/09/2012 1425   AST 16 01/22/2007 2031   ALT 19 11/09/2012 1425   ALT 19 01/22/2007 2031   BILITOT 0.57 11/09/2012 1425   BILITOT 0.8 01/22/2007 2031       Radiological Studies: See discussion above   Impression: #1. Intermediate risk myelodysplastic syndrome Today's labs are pending. I believe that the recurrent infectious processes occurring over the last few months are related to neutrophil dysfunction from his  underlying MDS.  #2. Bilateral cellulitis lower extremities resolved on the right active on the left.  cutaneous changes quite advanced. I believe he would benefit from parenteral antibiotics. We discussed this today. I would consider Rocephin 2 g daily for 10 days 14 days. He will need a PICC catheter placed for convenience of outpatient administration. He has followup with his internist this afternoon. I will defer to his judgment. I gave the patient my cell phone number I am happy to discuss management with his internist.  #3. 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 I suggested he take either acyclovir or Valtrex prophylaxis on a regular basis.  #4. Ischemic changes distal digits of his toes bilaterally No gross large vessel disease on vascular studies of his lower extremities done in February. He might still benefit from a vascular surgery  consultation.  #5. IgM Monoclonal gammopathy of undetermined significance Most recent IgM level only borderline elevated done on 06/09/2012. Repeat pending today. If level is the same it would not explain the ischemic changes of his feet.  #6. History of prostate cancer Annual exam and PSAs followed by Dr. Gaynelle Arabian.   CC: Patient Care Team: Lajean Manes, MD as PCP - General (Internal Medicine)   Annia Belt, MD 4/6/20159:50 AM

## 2013-07-13 ENCOUNTER — Inpatient Hospital Stay (HOSPITAL_COMMUNITY): Payer: Medicare Other

## 2013-07-13 DIAGNOSIS — D4620 Refractory anemia with excess of blasts, unspecified: Secondary | ICD-10-CM

## 2013-07-13 DIAGNOSIS — L02419 Cutaneous abscess of limb, unspecified: Principal | ICD-10-CM

## 2013-07-13 DIAGNOSIS — S22009A Unspecified fracture of unspecified thoracic vertebra, initial encounter for closed fracture: Secondary | ICD-10-CM

## 2013-07-13 DIAGNOSIS — L03119 Cellulitis of unspecified part of limb: Principal | ICD-10-CM

## 2013-07-13 DIAGNOSIS — C61 Malignant neoplasm of prostate: Secondary | ICD-10-CM

## 2013-07-13 DIAGNOSIS — I999 Unspecified disorder of circulatory system: Secondary | ICD-10-CM

## 2013-07-13 DIAGNOSIS — J309 Allergic rhinitis, unspecified: Secondary | ICD-10-CM

## 2013-07-13 DIAGNOSIS — D61818 Other pancytopenia: Secondary | ICD-10-CM

## 2013-07-13 DIAGNOSIS — I10 Essential (primary) hypertension: Secondary | ICD-10-CM

## 2013-07-13 DIAGNOSIS — D472 Monoclonal gammopathy: Secondary | ICD-10-CM

## 2013-07-13 LAB — CBC
HEMATOCRIT: 24.2 % — AB (ref 39.0–52.0)
Hemoglobin: 8.1 g/dL — ABNORMAL LOW (ref 13.0–17.0)
MCH: 34.5 pg — AB (ref 26.0–34.0)
MCHC: 33.5 g/dL (ref 30.0–36.0)
MCV: 103 fL — AB (ref 78.0–100.0)
Platelets: 106 10*3/uL — ABNORMAL LOW (ref 150–400)
RBC: 2.35 MIL/uL — AB (ref 4.22–5.81)
RDW: 15.2 % (ref 11.5–15.5)
WBC: 2.5 10*3/uL — ABNORMAL LOW (ref 4.0–10.5)

## 2013-07-13 LAB — PATHOLOGIST SMEAR REVIEW

## 2013-07-13 MED ORDER — GADOBENATE DIMEGLUMINE 529 MG/ML IV SOLN
20.0000 mL | Freq: Once | INTRAVENOUS | Status: AC
  Administered 2013-07-13: 20 mL via INTRAVENOUS

## 2013-07-13 MED ORDER — IBUPROFEN 400 MG PO TABS
400.0000 mg | ORAL_TABLET | Freq: Four times a day (QID) | ORAL | Status: DC | PRN
  Administered 2013-07-13 – 2013-07-14 (×2): 400 mg via ORAL
  Filled 2013-07-13 (×3): qty 1

## 2013-07-13 NOTE — Progress Notes (Signed)
Utilization review completed.  

## 2013-07-13 NOTE — Progress Notes (Signed)
Addendum  Patient seen and examined, chart and data base reviewed.  I agree with the above assessment and plan.  For full details please see Mrs. Imogene Burn PA note.  Left lower extremity cellulitis, seen by ID and recommended to continue vancomycin.  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   Birdie Hopes, MD [facility] Pager: [phone] 07/13/2013, 4:46 PM

## 2013-07-13 NOTE — Progress Notes (Signed)
PROGRESS NOTE  Joel Macdonald SWF:[id number] DOB: 12-Nov-1930 DOA: 07/12/2013 PCP: Mathews Argyle, MD  Joel Macdonald is a 80 y.o. male with a Past medical history of MGUS and MDS, who has been treated with several courses of antibiotic including doxycycline, Augmentin for a left lower extremity cellulitis. Who was referred by his PCP due to no improvement of his cellulitis. He relates it's been red warm and tender to touch for the past 2 weeks. He relates pustule that has recently burst and continues to be swollen. He relates that the erythema seems to be trending proximally. He relates no fever chills nausea vomiting or diarrhea. Lower extremity Doppler was negative for DVT.  Assessment/Plan:  LLE cellulitis Being treated with Vancomycin. ? Need for gram negative coverage as well. Per Dr. Beryle Beams, recurrent infectious processes are likely related to neutrophil dysfunction from his underlying MDS. ID has been consulted for antibiotic recommendations. LLE Doppler u/s done 3/30 is negative.  Blood cultures are pending (but likely negative as patient was on antibiotics) MRI pending Erythema - no better / no worse based on tracing done 4/6 Had RLE cellulitis 6 weeks ago.  MGUS/MDS Stable.  Dr. Beryle Beams is following outpatient WBC, platelets and Hgb trending down slightly.  Will monitor.  History of Prostate Cancer Followed outpatient by Dr. Gaynelle Arabian    DVT Prophylaxis:  heparin  Code Status: full Family Communication:  Disposition Plan: to home when able.   Consultants:  ID  Procedures:    Antibiotics: Anti-infectives   Start     Dose/Rate Route Frequency Ordered Stop   07/13/13 0800  vancomycin (VANCOCIN) IVPB 1000 mg/200 mL premix     1,000 mg 200 mL/hr over 60 Minutes Intravenous Every 12 hours 07/12/13 1900     07/12/13 2000  vancomycin (VANCOCIN) 1,500 mg in sodium chloride 0.9 % 500 mL IVPB     1,500 mg 250 mL/hr over 120 Minutes Intravenous  Once  07/12/13 1900 07/12/13 2231        HPI/Subjective: No complaints.  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  He has very close ties to [facility]    Objective: Filed Vitals:   07/12/13 1723 07/12/13 2335 07/13/13 0450  BP: 151/83 137/75 119/71  Pulse: 86 85 76  Temp: 99 F (37.2 C) 98.4 F (36.9 C) 98 F (36.7 C)  TempSrc: Oral Oral Oral  Resp: 20 18 18   Height: 6' (1.829 m)    Weight: 100.562 kg (221 lb 11.2 oz)  98.294 kg (216 lb 11.2 oz)  SpO2: 100% 100% 100%   No intake or output data in the 24 hours ending 07/13/13 1331 Filed Weights   07/12/13 1723 07/13/13 0450  Weight: 100.562 kg (221 lb 11.2 oz) 98.294 kg (216 lb 11.2 oz)    Exam: General: Well developed, well nourished, NAD, appears stated age, very pleasant HEENT:   Anicteic Sclera, MMM. No pharyngeal erythema or exudates  Neck: Supple, no JVD, no masses  Cardiovascular: RRR, S1 S2 auscultated, no rubs, murmurs or gallops.   Respiratory: Clear to auscultation bilaterally with equal chest rise  Abdomen: Soft, nontender, nondistended, + bowel sounds  Extremities: warm dry without cyanosis clubbing or edema.  Neuro: AAOx3, cranial nerves grossly intact. Strength 5/5 in upper and lower extremities  Skin: Without rashes exudates or nodules.  LLE has mild erythema below the need.  Swelling is 2+ and extends thru the dorsum of his foot.  There are multiple  scabbed areas on the LLE with some recent drainage. Psych: Normal affect and demeanor with intact judgement and insight   Data Reviewed: Basic Metabolic Panel:  Recent Labs Lab 07/12/13 0914 07/12/13 2036  NA 140  --   K 4.9  --   CL 102  --   CO2 23  --   GLUCOSE 94  --   BUN 21  --   CREATININE 0.94 0.95  CALCIUM 8.9  --    Liver Function Tests:  Recent Labs Lab 07/12/13 0914  AST 19  ALT 16  ALKPHOS 62  BILITOT 0.6  PROT 6.4  ALBUMIN 3.3*   CBC:  Recent Labs Lab 07/12/13 0914 07/12/13 2036  07/13/13 0642  WBC 3.8* 3.0* 2.5*  NEUTROABS 0.7*  --   --   HGB 9.2* 8.4* 8.1*  HCT 27.7* 24.5* 24.2*  MCV 103.7* 103.4* 103.0*  PLT 116* 103* 106*     Studies: No results found.  Scheduled Meds: . heparin  5,000 Units Subcutaneous 3 times per day  . pantoprazole  40 mg Oral Daily  . sodium chloride  3 mL Intravenous Q12H  . vancomycin  1,000 mg Intravenous Q12H   Continuous Infusions:   Principal Problem:   Cellulitis of leg, left Active Problems:   MGUS (monoclonal gammopathy of unknown significance)   MDS (myelodysplastic syndrome), low grade   Cellulitis and abscess of leg    Karen Kitchens  [facility] Pager [phone]. If 7PM-7AM, please contact night-coverage at [email or web address], password [facility] 07/13/2013, 1:31 PM  LOS: 1 day

## 2013-07-13 NOTE — Progress Notes (Signed)
Nutrition Brief Note  Patient identified on the Malnutrition Screening Tool (MST) Report. Pt reported slight weight loss on admission 2/2 acute illness. Pt states that he is happy with his weight loss and denies any nutrition concerns at this time. Reports that his appetite is improving.  Wt Readings from Last 15 Encounters:  07/13/13 216 lb 11.2 oz (98.294 kg)  07/12/13 221 lb 11.2 oz (100.562 kg)  03/08/13 228 lb 8 oz (103.647 kg)  09/14/12 226 lb 1.6 oz (102.558 kg)  05/22/12 227 lb 4.8 oz (103.103 kg)  03/20/12 226 lb 9.6 oz (102.785 kg)  09/20/11 226 lb (102.513 kg)  06/17/11 226 lb 6.4 oz (102.694 kg)  05/20/11 224 lb 3.2 oz (101.696 kg)  10/17/09 218 lb (98.884 kg)  01/22/07 233 lb (105.688 kg)    Body mass index is 29.38 kg/(m^2). Patient meets criteria for overweight based on current BMI.   Current diet order is Regular, patient is consuming approximately >50% of meals at this time. Labs and medications reviewed.   No nutrition interventions warranted at this time. If nutrition issues arise, please consult RD.   Inda Coke MS, RD, LDN Inpatient Registered Dietitian Pager: [phone] After-hours pager: [phone]

## 2013-07-13 NOTE — Consult Note (Signed)
Joel Macdonald for Infectious Disease    Date of Admission:  07/12/2013  Date of Consult:  07/13/2013  Reason for Consult: Persistent cellulitis of left lower extremity despite therapy with Augmentin and doxycycline Referring Physician: Dr. 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 He then developed erythema and the opposite leg which progressed from his foot up to just below his knee. He is treated with Augmentin for this but this also failed to resolve. He developed a draining lesion on his anterior shin which drained clear fluid. Doxycycline was added but despite Augmentin and doxycycline and his cellulitis has failed to resolve. He did have some low-grade temperatures last week. He also has some darkly pigmented eschar-like lesions on his left leg that have developed over the last week. There is no actual frank ulceration. He has been admitted to the hospital after having seen his oncologist Dr. Beryle Beams and was started on IV vancomycin last night. An MRI of the leg is planned for today we were consulted to assist in management work this patient cellulitis. Is not having unusual exposure history such as waiting in fresh water or salt water or being in a hot tub. He does like to garden some but has not done so much recently. He does as an admission admitted that he has not been elevating his leg is much as he might due the fact he likes to walk his dog.   Past Medical History  Diagnosis Date  . MGUS (monoclonal gammopathy of unknown significance) 06/17/2011  . Cellulitis of leg, right 07/12/2013  . Ischemia of extremity 07/12/2013    Bilateral toes  2,3,4 right; 1,3 left  Vascular doppler normal for large vessel occlusion 2/15  . Heart murmur  1936  . Pancytopenia, acquired 06/17/2011    "from my Myelodysplastic syndrome"  . GERD (gastroesophageal reflux disease)   . Osteoarthritis     "back, legs, arms, shoulders" (07/12/2013)  . MDS (myelodysplastic syndrome), low grade 09/20/2011    Bone marrow bx 06/13/11: mild dyserythro/dymegakaryopoiesis.  Normal cytogenetics.  FISH negative for chrom 5 or 7 deletions.  Scattered small infiltrates of plasmacytoid lymphs  . Prostate cancer     Past Surgical History  Procedure Laterality Date  . Tonsillectomy and adenoidectomy  ~ 1939  . Robot assisted laparoscopic radical prostatectomy  ~ 2010  . Cataract extraction w/ intraocular lens  implant, bilateral Bilateral ~ 2012  ergies:   Allergies  Allergen Reactions  . Other Other (See Comments)    Ragweed causes nasal congestion     Medications: I have reviewed patients current medications as documented in Epic Anti-infectives   Start     Dose/Rate Route Frequency Ordered Stop   07/13/13 0800  vancomycin (VANCOCIN) IVPB 1000 mg/200 mL premix     1,000 mg 200 mL/hr over 60 Minutes Intravenous Every 12 hours 07/12/13 1900     07/12/13 2000  vancomycin (VANCOCIN) 1,500 mg in sodium chloride 0.9 % 500 mL IVPB     1,500 mg 250 mL/hr over 120 Minutes Intravenous  Once 07/12/13 1900 07/12/13 2231      Social History:  reports that he quit smoking about 20 years ago. His smoking use included Pipe. He has never used smokeless tobacco. He reports that he drinks about 10.2 ounces of  alcohol per week. He reports that he does not use illicit drugs.  Family History  Problem Relation Age of Onset  . Other Mother   . Other Father     As in HPI and primary teams notes otherwise 12 point review of systems is negative  Blood pressure 119/71, pulse 76, temperature 98 F (36.7 C), temperature source Oral, resp. rate 18, height 6' (1.829 m), weight 216 lb 11.2 oz (98.294 kg), SpO2 100.00%. General: Alert and awake, oriented x3, not in any acute  distress. HEENT: anicteric sclera, pupils reactive to light and accommodation, EOMI, oropharynx clear and without exudate CVS regular rate, normal r,  no murmur rubs or gallops Chest: clear to auscultation bilaterally, no wheezing, rales or rhonchi Abdomen: soft nontender, nondistended, normal bowel sounds, Extremities/skin:   RLE: Venous patient stasis changes but no erythema and no tenderness    LLE:   the first and 3rd digits with  first and third digit with some hyperpigmented changes in the toes the first and third digits wit     Left leg with two eschar like lesions anteriorly one of which had drained material   Close up   One of eschar-like, almost ecthyma gangrenosum appearing lesion      Neuro: nonfocal, strength and sensation intact   Results for orders placed during the hospital encounter of 07/12/13 (from the past 48 hour(s))  CBC     Status: Abnormal   Collection Time    07/12/13  8:36 PM      Result Value Ref Range   WBC 3.0 (*) 4.0 - 10.5 K/uL   RBC 2.37 (*) 4.22 - 5.81 MIL/uL   Hemoglobin 8.4 (*) 13.0 - 17.0 g/dL   HCT 24.5 (*) 39.0 - 52.0 %   MCV 103.4 (*) 78.0 - 100.0 fL   MCH 35.4 (*) 26.0 - 34.0 pg   MCHC 34.3  30.0 - 36.0 g/dL   RDW 15.2  11.5 - 15.5 %   Platelets 103 (*) 150 - 400 K/uL   Comment: PLATELET COUNT CONFIRMED BY SMEAR  CREATININE, SERUM     Status: Abnormal   Collection Time    07/12/13  8:36 PM      Result Value Ref Range   Creatinine, Ser 0.95  0.50 - 1.35 mg/dL   GFR calc non Af Amer 76 (*) >90 mL/min   GFR calc Af Amer 88 (*) >90 mL/min   Comment: (NOTE)     The eGFR has been calculated using the CKD EPI equation.     This calculation has not been validated in all clinical situations.     eGFR's persistently <90 mL/min signify possible Chronic Kidney     Disease.  CBC     Status: Abnormal   Collection Time    07/13/13  6:42 AM      Result Value Ref Range   WBC 2.5 (*) 4.0 - 10.5 K/uL   RBC 2.35 (*) 4.22 - 5.81  MIL/uL   Hemoglobin 8.1 (*) 13.0 - 17.0 g/dL   HCT 24.2 (*) 39.0 - 52.0 %   MCV 103.0 (*) 78.0 - 100.0 fL   MCH 34.5 (*) 26.0 - 34.0 pg   MCHC 33.5  30.0 - 36.0 g/dL   RDW 15.2  11.5 - 15.5 %   Platelets 106 (*) 150 - 400 K/uL   Comment: CONSISTENT WITH PREVIOUS RESULT   No results found for this basename: sdes, specrequest, cult, reptstatus   No results found.  No results found for this or any previous visit (from the past 720 hour(s)).   Impression/Recommendation  Principal Problem:   Cellulitis of leg, left Active Problems:   MGUS (monoclonal gammopathy of unknown significance)   MDS (myelodysplastic syndrome), low grade   Cellulitis and abscess of leg   DELMOS VELAQUEZ is a 80 y.o. 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  #1 Cellulitis:  Despite the ecthyma+gangrenosum like lesions which might suggest pseudomonas infection, I would prefer to proceed for now with vancomycin for now and more narrow coverage with elevation of the leg  I will followup MRI results  I expect that we may need to add anti-pseudomonal coverage, likely with cipro or levo which would afford ease of oral admin at home--though would inrease risk for CDI     07/13/2013, 1:23 PM   Thank you so much for this interesting consult  [place] for [place] [phone] (pager) [phone] (office) 07/13/2013, 1:23 PM  [place] 07/13/2013, 1:23 PM

## 2013-07-13 NOTE — Progress Notes (Signed)
Patient ID: Joel Macdonald, male   DOB: 19-Oct-1930, 80 y.o.   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 He was diagnosed with an intermediate risk myelodysplastic syndrome about 2 years ago in October 2013. He has chronic pancytopenia and monocytosis. Blood counts are been overall stable on no specific therapy. He also has an IgM monoclonal gammopathy undetermined significance with a borderline elevated total serum IgM which has been stable over 2 years of observation including value done yesterday of 343 mg percent.. Please see my clinic note from yesterday April 6 for additional details of his past and current medical history. He was in for a routine visit  and gave a history of recent right lower extremity cellulitis which resolved on oral antibiotics. He then developed a new left lower extremity cellulitis which is quite advanced on exam with significant swelling of his left foot and calf, extensive erythema in the tibial area, some early superficial blistering with serous exudate, some ischemic changes of some of his toes.  He felt things were getting worse despite one week of oral antibiotics initially with Augmentin then the addition of doxycycline. He reported intermittent fevers as high as 101 along with shaking chills although he was afebrile at time of my exam yesterday. Initial treatments were started by his internist Dr. Felipa Eth and he had a followup visit with Dr. Felipa Eth yesterday afternoon. I felt  he would benefit by parenteral antibiotics. Dr. Felipa Eth agreed and he was admitted for further treatment. CBC obtained yesterday with values close to his baseline except for monocytes reported out as 50%. I reviewed the peripheral blood film. I did not feel that he had 50% monocytes. I did not see any cells that I thought were suspicious for blasts/conversion to acute leukemia. He was seen in consultation by infectious disease and I discussed management with Dr. Drucilla Schmidt. He  was started on empiric single agent vancomycin which has been continued. He has been afebrile since hospital admission. Leg looks much better to me today with less swelling and erythema. MRI was done this afternoon which is compatible with clinical findings of diffuse cellulitis but no findings for a deep soft tissue infection or osteomyelitis. He will continue with the current treatment plan.

## 2013-07-14 LAB — SPEP & IFE WITH QIG
Albumin ELP: 55.6 % — ABNORMAL LOW (ref 55.8–66.1)
Alpha-1-Globulin: 9.1 % — ABNORMAL HIGH (ref 2.9–4.9)
Alpha-2-Globulin: 12.3 % — ABNORMAL HIGH (ref 7.1–11.8)
Beta 2: 3.9 % (ref 3.2–6.5)
Beta Globulin: 5.7 % (ref 4.7–7.2)
GAMMA GLOBULIN: 13.4 % (ref 11.1–18.8)
IGA: 182 mg/dL (ref 68–379)
IgG (Immunoglobin G), Serum: 706 mg/dL (ref 650–1600)
IgM, Serum: 343 mg/dL — ABNORMAL HIGH (ref 41–251)
M-Spike, %: 0.39 g/dL
Total Protein, Serum Electrophoresis: 6.3 g/dL (ref 6.0–8.3)

## 2013-07-14 MED ORDER — DOXYCYCLINE HYCLATE 100 MG PO CAPS: 100.0000 mg | ORAL_CAPSULE | Freq: Two times a day (BID) | ORAL | Status: DC

## 2013-07-14 MED ORDER — FUROSEMIDE 10 MG/ML IJ SOLN
40.0000 mg | Freq: Once | INTRAMUSCULAR | Status: AC
  Administered 2013-07-14: 40 mg via INTRAVENOUS
  Filled 2013-07-14: qty 4

## 2013-07-14 MED ORDER — CEPHALEXIN 500 MG PO CAPS: 500.0000 mg | ORAL_CAPSULE | Freq: Four times a day (QID) | ORAL | Status: DC

## 2013-07-14 NOTE — Discharge Summary (Signed)
Addendum  Patient seen and examined, chart and data base reviewed.  I agree with the above assessment and plan.  For full details please see Mrs. Imogene Burn PA note.  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  On discharge Keflex and doxycycline for 10 more days.  Followup with ID, hematology and primary care physician Dr. Felipa Eth.   Birdie Hopes, MD [facility] Pager: [phone] 07/14/2013, 5:41 PM

## 2013-07-14 NOTE — Discharge Summary (Signed)
Physician Discharge Summary  Joel Macdonald ZSW:[id number] DOB: 1930/10/02 DOA: 07/12/2013  PCP: Mathews Argyle, MD  Admit date: 07/12/2013 Discharge date: 07/14/2013  Time spent: 40 minutes  Recommendations for Outpatient Follow-up:  1. Follow up with [facility] on 4/17 at 10:45 am for LLE cellulitis in setting of MGUS/MDS 2.  Follow up with Drs. Lemont and Granfortuna as previously scheduled.  Discharge Diagnoses:  Principal Problem:   Cellulitis of leg, left Active Problems:   MGUS (monoclonal gammopathy of unknown significance)   MDS (myelodysplastic syndrome), low grade   Cellulitis and abscess of leg   Discharge Condition: stable.  Well appearing.  Diet recommendation: low salt heart healthy  Filed Weights   07/12/13 1723 07/13/13 0450 07/14/13 0542  Weight: 100.562 kg (221 lb 11.2 oz) 98.294 kg (216 lb 11.2 oz) 98 kg (216 lb 0.8 oz)    History of present illness:  Joel Macdonald is a 80 y.o. male with a Past medical history of MGUS and MDS, who has been treated with several courses of antibiotic including doxycycline, Augmentin for a left lower extremity cellulitis. Who was referred by his PCP due to no improvement of his cellulitis. He relates it's been red warm and tender to touch for the past 2 weeks. He relates pustule that has recently burst and continues to be swollen. He relates that the erythema seems to be trending proximally. He relates no fever chills nausea vomiting or diarrhea. Lower extremity Doppler was negative for DVT.   Hospital Course:   LLE Cellulitis:  Recently had RLE cellulitis 6 weeks ago.  Dr. Tamala Julian was admitted and placed on IV vancomycin.    Dr. Azucena Freed previous office note indicated that recurrent infectious processes are likely related to neutrophil dysfunction from his underlying MDS.   ID, Dr. Drucilla Schmidt consulted.  After reviewing the MRI he recommended Oritavancin x 1 dose versus oral Keflex and Doxy for 10 days with ID clinic follow  up.  [facility] did not have Oritavancin in stock so Dr. Tamala Julian was prescribed oral antibiotics on D/C with follow up in the ID clinic on 4/17.  Over the course of his hospitalization, the LLE erythema improved however, unfortunately the swelling may have become slightly worse.  He was advised to keep his legs elevated.  He received a dose of IV Lasix on 4/8 and was discharged to home.       MGUS/MDS   Stable. Dr. Beryle Beams is following outpatient   WBC, platelets and Hgb trending down slightly.   History of Prostate Cancer   Followed outpatient by Dr. Gaynelle Arabian  Consultations:  Infectious Disease  Hematology  Discharge Exam: Filed Vitals:   07/14/13 1351  BP: 150/82  Pulse: 75  Temp: 98.1 F (36.7 C)  Resp: 18   General: Well developed, well nourished, NAD, appears stated age, very pleasant  HEENT: Anicteic Sclera, MMM. No pharyngeal erythema or exudates  Neck: Supple, no JVD, no masses  Cardiovascular: RRR, S1 S2 auscultated, no rubs, murmurs or gallops.  Respiratory: Clear to auscultation bilaterally with equal chest rise  Abdomen: Soft, nontender, nondistended, + bowel sounds  Extremities: warm dry without cyanosis clubbing or edema. 1 and 3 toe on LLE are slightly more darkened red than 2,4,5 Neuro: AAOx3, cranial nerves grossly intact. Strength 5/5 in upper and lower extremities  Skin: Without rashes exudates or nodules. LLE has mild erythema below the need. Swelling is 2+ and extends thru the dorsum of his foot. There are 2 scabbed areas on  the LLE with some evidence of recent drainage.  Psych: Normal affect and demeanor with intact judgement and insight    Discharge Instructions       Discharge Orders   Future Appointments Provider Department Dept Phone   07/23/2013 10:45 AM Campbell Riches, MD [facility] for [facility] [phone]   08/02/2013 9:15 AM Annia Belt, MD [facility] [place]  [phone]   09/13/2013 9:30 AM [facility] [place] [phone]   Future Orders Complete By Expires   Diet - low sodium heart healthy  As directed    Increase activity slowly  As directed        Medication List    STOP taking these medications       amoxicillin-clavulanate 875-125 MG per tablet  Commonly known as:  AUGMENTIN      TAKE these medications       aspirin-sod bicarb-citric acid 325 MG Tbef tablet  Commonly known as:  ALKA-SELTZER  Take 650 mg by mouth every 6 (six) hours as needed (sore thoart).     cephALEXin 500 MG capsule  Commonly known as:  KEFLEX  Take 1 capsule (500 mg total) by mouth 4 (four) times daily.     clobetasol ointment 0.05 %  Commonly known as:  TEMOVATE  Apply 1 application topically 2 (two) times daily. Apply to psoriasis     doxycycline 100 MG capsule  Commonly known as:  VIBRAMYCIN  Take 1 capsule (100 mg total) by mouth 2 (two) times daily. For 10 days     ibuprofen 200 MG tablet  Commonly known as:  ADVIL,MOTRIN  Take 400 mg by mouth every 6 (six) hours as needed for mild pain. Takes 400-600mg      ranitidine 150 MG tablet  Commonly known as:  ZANTAC  Take 150 mg by mouth as needed for heartburn.       Allergies  Allergen Reactions  . Other Other (See Comments)    Ragweed causes nasal congestion   Follow-up Information   Follow up with Bobby Rumpf, MD On 07/23/2013. (Appointment with Dr. Johnnye Sima on 07/23/13 at 10:45)    Specialty:  Infectious Diseases   Contact information:   [address]  [address] [place] [address] [phone]        The results of significant diagnostics from this hospitalization (including imaging, microbiology, ancillary and laboratory) are listed below for reference.    Significant Diagnostic Studies: Mr Tibia Fibula Left W Wo Contrast  07/13/2013   CLINICAL DATA:  Left lower extremity pain, swelling and redness.  EXAM: MRI OF LOWER  LEFT EXTREMITY WITHOUT AND WITH CONTRAST  TECHNIQUE: Multiplanar, multisequence MR imaging of the lower left extremity was performed both before and after administration of intravenous contrast.  CONTRAST:  53mL MULTIHANCE GADOBENATE DIMEGLUMINE 529 MG/ML IV SOLN  COMPARISON:  None.  FINDINGS: Marked diffuse abnormal subcutaneous soft tissue swelling/ edema and fluid involving both legs but the left is much worse on the right. No discrete rim enhancing fluid collection to suggest a drainable abscess. No findings for myofasciitis or pyomyositis. No evidence of septic arthritis or osteomyelitis.  Focal area of signal abnormality in the proximal fibular shaft. Assess low T1 and high T2 signal intensity and may be a focal area of red marrow conversion. This is does not have the appearance of infection or tumor.  IMPRESSION: Severe diffuse cellulitis involving the left lower extremity. Similar but less  significant findings involving the right lower extremity.  No findings for myofasciitis, pyomyositis, focal soft tissue abscess, septic arthritis or osteomyelitis.   Electronically Signed   By: Kalman Jewels M.D.   On: 07/13/2013 16:59    Microbiology: Recent Results (from the past 240 hour(s))  CULTURE, BLOOD (ROUTINE X 2)     Status: None   Collection Time    07/12/13  8:36 PM      Result Value Ref Range Status   Specimen Description BLOOD RIGHT ARM   Final   Special Requests BOTTLES DRAWN AEROBIC AND ANAEROBIC 5CC   Final   Culture  Setup Time     Final   Value: 07/13/2013 01:02     Performed at [facility]   Culture     Final   Value:        BLOOD CULTURE RECEIVED NO GROWTH TO DATE CULTURE WILL BE HELD FOR 5 DAYS BEFORE ISSUING A FINAL NEGATIVE REPORT     Performed at [facility]   Report Status PENDING   Incomplete  CULTURE, BLOOD (ROUTINE X 2)     Status: None   Collection Time    07/12/13  8:44 PM      Result Value Ref Range Status   Specimen Description BLOOD RIGHT ARM    Final   Special Requests     Final   Value: BOTTLES DRAWN AEROBIC AND ANAEROBIC 10CC AER,5CC ANA   Culture  Setup Time     Final   Value: 07/13/2013 01:02     Performed at [facility]   Culture     Final   Value:        BLOOD CULTURE RECEIVED NO GROWTH TO DATE CULTURE WILL BE HELD FOR 5 DAYS BEFORE ISSUING A FINAL NEGATIVE REPORT     Performed at [facility]   Report Status PENDING   Incomplete     Labs: Basic Metabolic Panel:  Recent Labs Lab 07/12/13 0914 07/12/13 2036  NA 140  --   K 4.9  --   CL 102  --   CO2 23  --   GLUCOSE 94  --   BUN 21  --   CREATININE 0.94 0.95  CALCIUM 8.9  --    Liver Function Tests:  Recent Labs Lab 07/12/13 0914  AST 19  ALT 16  ALKPHOS 62  BILITOT 0.6  PROT 6.4  ALBUMIN 3.3*   CBC:  Recent Labs Lab 07/12/13 0914 07/12/13 2036 07/13/13 0642  WBC 3.8* 3.0* 2.5*  NEUTROABS 0.7*  --   --   HGB 9.2* 8.4* 8.1*  HCT 27.7* 24.5* 24.2*  MCV 103.7* 103.4* 103.0*  PLT 116* 103* 106*   SignedKaren Kitchens [phone]  [facility] 07/14/2013, 3:09 PM

## 2013-07-14 NOTE — Progress Notes (Signed)
Nsg Discharge Note  Admit Date:  07/12/2013 Discharge date: 07/14/2013   Lonna Cobb to be D/C'd Home per MD order.  AVS completed.  Copy for chart, and copy for patient signed, and dated. Patient/caregiver able to verbalize understanding.  Discharge Medication:   Medication List    STOP taking these medications       amoxicillin-clavulanate 875-125 MG per tablet  Commonly known as:  AUGMENTIN      TAKE these medications       aspirin-sod bicarb-citric acid 325 MG Tbef tablet  Commonly known as:  ALKA-SELTZER  Take 650 mg by mouth every 6 (six) hours as needed (sore thoart).     cephALEXin 500 MG capsule  Commonly known as:  KEFLEX  Take 1 capsule (500 mg total) by mouth 4 (four) times daily.     clobetasol ointment 0.05 %  Commonly known as:  TEMOVATE  Apply 1 application topically 2 (two) times daily. Apply to psoriasis     doxycycline 100 MG capsule  Commonly known as:  VIBRAMYCIN  Take 1 capsule (100 mg total) by mouth 2 (two) times daily. For 10 days     ibuprofen 200 MG tablet  Commonly known as:  ADVIL,MOTRIN  Take 400 mg by mouth every 6 (six) hours as needed for mild pain. Takes 400-600mg      ranitidine 150 MG tablet  Commonly known as:  ZANTAC  Take 150 mg by mouth as needed for heartburn.        Discharge Assessment: Filed Vitals:   07/14/13 1351  BP: 150/82  Pulse: 75  Temp: 98.1 F (36.7 C)  Resp: 18   Skin clean, dry and intact without evidence of skin break down, no evidence of skin tears noted. IV catheter discontinued intact. Site without signs and symptoms of complications - no redness or edema noted at insertion site, patient denies c/o pain - only slight tenderness at site.  Dressing with slight pressure applied.  D/c Instructions-Education: Discharge instructions given to patient/family with verbalized understanding. D/c education completed with patient/family including follow up instructions, medication list, d/c activities limitations  if indicated, with other d/c instructions as indicated by MD - patient able to verbalize understanding, all questions fully answered. Patient instructed to return to ED, call 911, or call MD for any changes in condition.  Patient escorted via [place], and D/C home via private auto.  Dayle Points, RN 07/14/2013 2:07 PM

## 2013-07-14 NOTE — Progress Notes (Signed)
[facility]    Subjective: He feels better and wishes to go home on oral abx   Antibiotics:  Anti-infectives   Start     Dose/Rate Route Frequency Ordered Stop   07/14/13 0000  doxycycline (VIBRAMYCIN) 100 MG capsule    Comments:  Dr. Tamala Julian will need a total of 10 days of Doxycycline starting this evening 07/14/2013.   100 mg Oral 2 times daily 07/14/13 1051     07/14/13 0000  cephALEXin (KEFLEX) 500 MG capsule     500 mg Oral 4 times daily 07/14/13 1051     07/13/13 0800  vancomycin (VANCOCIN) IVPB 1000 mg/200 mL premix  Status:  Discontinued     1,000 mg 200 mL/hr over 60 Minutes Intravenous Every 12 hours 07/12/13 1900 07/14/13 1708   07/12/13 2000  vancomycin (VANCOCIN) 1,500 mg in sodium chloride 0.9 % 500 mL IVPB     1,500 mg 250 mL/hr over 120 Minutes Intravenous  Once 07/12/13 1900 07/12/13 2231      Medications: Scheduled Meds: Continuous Infusions: PRN Meds:.      Objective: Weight change: -5 lb 10.4 oz (-2.562 kg) No intake or output data in the 24 hours ending 07/14/13 2106 Blood pressure 150/82, pulse 75, temperature 98.1 F (36.7 C), temperature source Oral, resp. rate 18, height 6' (1.829 m), weight 216 lb 0.8 oz (98 kg), SpO2 99.00%. Temp:  [98.1 F (36.7 C)-98.6 F (37 C)] 98.1 F (36.7 C) (04/08 1351) Pulse Rate:  [73-75] 75 (04/08 1351) Resp:  [18] 18 (04/08 1351) BP: (116-150)/(66-82) 150/82 mmHg (04/08 1351) SpO2:  [99 %] 99 % (04/08 1351) Weight:  [216 lb 0.8 oz (98 kg)] 216 lb 0.8 oz (98 kg) (04/08 0542)  Physical Exam: General: Alert and awake, oriented x3, not in any acute distress.  Skin:   LLE sequential pictures:  07/13/13:           07/14/13:         Neuro: nonfocal  CBC:  Recent Labs Lab 07/12/13 0914 07/12/13 2036 07/13/13 0642  HGB 9.2* 8.4* 8.1*  HCT 27.7* 24.5* 24.2*  PLT 116* 103* 106*     BMET  Recent Labs  07/12/13 0914 07/12/13 2036  NA 140  --   K 4.9  --     CL 102  --   CO2 23  --   GLUCOSE 94  --   BUN 21  --   CREATININE 0.94 0.95  CALCIUM 8.9  --      Liver Panel   Recent Labs  07/12/13 0914  PROT 6.4  ALBUMIN 3.3*  AST 19  ALT 16  ALKPHOS 62  BILITOT 0.6       Sedimentation Rate No results found for this basename: ESRSEDRATE,  in the last 72 hours C-Reactive Protein No results found for this basename: CRP,  in the last 72 hours  Micro Results: Recent Results (from the past 240 hour(s))  CULTURE, BLOOD (ROUTINE X 2)     Status: None   Collection Time    07/12/13  8:36 PM      Result Value Ref Range Status   Specimen Description BLOOD RIGHT ARM   Final   Special Requests BOTTLES DRAWN AEROBIC AND ANAEROBIC 5CC   Final   Culture  Setup Time     Final   Value: 07/13/2013 01:02     Performed at [place]     Final  Value:        BLOOD CULTURE RECEIVED NO GROWTH TO DATE CULTURE WILL BE HELD FOR 5 DAYS BEFORE ISSUING A FINAL NEGATIVE REPORT     Performed at [facility]   Report Status PENDING   Incomplete  CULTURE, BLOOD (ROUTINE X 2)     Status: None   Collection Time    07/12/13  8:44 PM      Result Value Ref Range Status   Specimen Description BLOOD RIGHT ARM   Final   Special Requests     Final   Value: BOTTLES DRAWN AEROBIC AND ANAEROBIC 10CC AER,5CC ANA   Culture  Setup Time     Final   Value: 07/13/2013 01:02     Performed at [facility]   Culture     Final   Value:        BLOOD CULTURE RECEIVED NO GROWTH TO DATE CULTURE WILL BE HELD FOR 5 DAYS BEFORE ISSUING A FINAL NEGATIVE REPORT     Performed at [facility]   Report Status PENDING   Incomplete    Studies/Results: Mr Tibia Fibula Left W Wo Contrast  07/13/2013   CLINICAL DATA:  Left lower extremity pain, swelling and redness.  EXAM: MRI OF LOWER LEFT EXTREMITY WITHOUT AND WITH CONTRAST  TECHNIQUE: Multiplanar, multisequence MR imaging of the lower left extremity was performed both before and after  administration of intravenous contrast.  CONTRAST:  61mL MULTIHANCE GADOBENATE DIMEGLUMINE 529 MG/ML IV SOLN  COMPARISON:  None.  FINDINGS: Marked diffuse abnormal subcutaneous soft tissue swelling/ edema and fluid involving both legs but the left is much worse on the right. No discrete rim enhancing fluid collection to suggest a drainable abscess. No findings for myofasciitis or pyomyositis. No evidence of septic arthritis or osteomyelitis.  Focal area of signal abnormality in the proximal fibular shaft. Assess low T1 and high T2 signal intensity and may be a focal area of red marrow conversion. This is does not have the appearance of infection or tumor.  IMPRESSION: Severe diffuse cellulitis involving the left lower extremity. Similar but less significant findings involving the right lower extremity.  No findings for myofasciitis, pyomyositis, focal soft tissue abscess, septic arthritis or osteomyelitis.   Electronically Signed   By: Kalman Jewels M.D.   On: 07/13/2013 16:59      Assessment/Plan:  Principal Problem:   Cellulitis of leg, left Active Problems:   MGUS (monoclonal gammopathy of unknown significance)   MDS (myelodysplastic syndrome), low grade   Cellulitis and abscess of leg    Joel Macdonald is a 80 y.o. 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 He certainly seems stable to improved overnight on ONLY gram positive coverage  #1 Cellulitis: Given stablity on only gram positive coverage I would be ok to dc on oral abx. We considered giving IV oritovancin but not available for starters today  --will dc on oral keflex 500mg  qid to cover strep species more common in nonpurulent cellulitis and also doxy for MRSA coverage --will give him 10 additional days --elevate leg --fu with me in [facility] next Wednesday    LOS: 2 days   Truman Hayward 07/14/2013, 9:06 PM

## 2013-07-14 NOTE — Discharge Instructions (Signed)
Please keep legs elevated above the level of the heart when not standing.

## 2013-07-19 LAB — CULTURE, BLOOD (ROUTINE X 2)
CULTURE: NO GROWTH
Culture: NO GROWTH

## 2013-07-20 ENCOUNTER — Ambulatory Visit (INDEPENDENT_AMBULATORY_CARE_PROVIDER_SITE_OTHER): Payer: Medicare Other | Admitting: Infectious Disease

## 2013-07-20 ENCOUNTER — Encounter: Payer: Self-pay | Admitting: Infectious Disease

## 2013-07-20 VITALS — BP 122/75 | HR 76 | Temp 97.9°F | Wt 216.0 lb

## 2013-07-20 DIAGNOSIS — L0390 Cellulitis, unspecified: Secondary | ICD-10-CM

## 2013-07-20 DIAGNOSIS — D472 Monoclonal gammopathy: Secondary | ICD-10-CM

## 2013-07-20 DIAGNOSIS — D61818 Other pancytopenia: Secondary | ICD-10-CM

## 2013-07-20 DIAGNOSIS — L0291 Cutaneous abscess, unspecified: Secondary | ICD-10-CM

## 2013-07-20 NOTE — Progress Notes (Signed)
   Subjective:    Patient ID: Joel Macdonald, male    DOB: Apr 12, 1930, 80 y.o.   MRN: [id number]  HPI  DEMONI Macdonald is a 80 y.o. male with MGUS, pancytopenia, neutropenia who I saw for non resolving cellulitis of the LLE as an inpatient. We treated with Vanco vancomycin overnight and then transitioned him back to oral Keflex 4 times a day along with doxycycline twice daily. He continues to make improvement now in his lower from a cellulitis and is nearly done with his course of therapy. He has no fevers nausea malaise or other systemic symptoms. C. sequential pictures below.   Review of Systems  Unable to perform ROS Constitutional: Negative for fever, chills, diaphoresis, activity change, appetite change, fatigue and unexpected weight change.  Respiratory: Positive for cough.   Gastrointestinal: Negative for nausea, vomiting, diarrhea, constipation, blood in stool and abdominal distention.  Musculoskeletal: Negative for arthralgias, back pain and gait problem.  Skin: Positive for rash. Negative for color change, pallor and wound.  Neurological: Negative for dizziness and light-headedness.       Objective:   Physical Exam  Nursing note and vitals reviewed. Constitutional: He appears well-developed and well-nourished. No distress.  HENT:  Head: Normocephalic and atraumatic.  Eyes: EOM are normal.  Neck: Normal range of motion. Neck supple.  Cardiovascular: Normal rate.   Pulmonary/Chest: Effort normal. No respiratory distress. He has no wheezes.  Abdominal: Soft. He exhibits no distension.  Musculoskeletal:       Legs:  07/13/2013    Posterior leg 07/13/2013    Anterior shin 07/20/2013     Posterior calf 07/20/2013          Assessment & Plan:   Cellulitis: seems to be resolving finish course of doxy and keflex and RTC PRN He has close followup with both his primary care physician Dr. Felipa Eth and with Dr. Beryle Beams .   MGUS, pancytopenia: followed closely  by DR. Granfortuna

## 2013-07-23 ENCOUNTER — Ambulatory Visit: Payer: Medicare Other | Admitting: Infectious Diseases

## 2013-08-02 ENCOUNTER — Encounter: Payer: Self-pay | Admitting: Vascular Surgery

## 2013-08-02 ENCOUNTER — Ambulatory Visit (INDEPENDENT_AMBULATORY_CARE_PROVIDER_SITE_OTHER): Payer: Medicare Other | Admitting: Oncology

## 2013-08-02 ENCOUNTER — Ambulatory Visit (HOSPITAL_COMMUNITY)
Admission: RE | Admit: 2013-08-02 | Discharge: 2013-08-02 | Disposition: A | Discharge status: Home / Self Care | Payer: Medicare Other | Source: Ambulatory Visit | Attending: Surgery | Admitting: Surgery

## 2013-08-02 ENCOUNTER — Encounter: Payer: Self-pay | Admitting: Oncology

## 2013-08-02 ENCOUNTER — Other Ambulatory Visit: Payer: Self-pay | Admitting: Oncology

## 2013-08-02 VITALS — BP 137/82 | HR 84 | Temp 96.7°F | Ht 73.0 in | Wt 219.0 lb

## 2013-08-02 DIAGNOSIS — L539 Erythematous condition, unspecified: Secondary | ICD-10-CM

## 2013-08-02 DIAGNOSIS — M7989 Other specified soft tissue disorders: Secondary | ICD-10-CM

## 2013-08-02 DIAGNOSIS — D61818 Other pancytopenia: Secondary | ICD-10-CM

## 2013-08-02 DIAGNOSIS — L03119 Cellulitis of unspecified part of limb: Secondary | ICD-10-CM

## 2013-08-02 DIAGNOSIS — L02419 Cutaneous abscess of limb, unspecified: Secondary | ICD-10-CM

## 2013-08-02 DIAGNOSIS — I776 Arteritis, unspecified: Secondary | ICD-10-CM

## 2013-08-02 DIAGNOSIS — I999 Unspecified disorder of circulatory system: Secondary | ICD-10-CM

## 2013-08-02 DIAGNOSIS — D4620 Refractory anemia with excess of blasts, unspecified: Secondary | ICD-10-CM

## 2013-08-02 DIAGNOSIS — I998 Other disorder of circulatory system: Secondary | ICD-10-CM

## 2013-08-02 DIAGNOSIS — L03116 Cellulitis of left lower limb: Secondary | ICD-10-CM

## 2013-08-02 DIAGNOSIS — D469 Myelodysplastic syndrome, unspecified: Secondary | ICD-10-CM

## 2013-08-02 DIAGNOSIS — D472 Monoclonal gammopathy: Secondary | ICD-10-CM

## 2013-08-02 LAB — SEDIMENTATION RATE: Sed Rate: 49 mm/hr — ABNORMAL HIGH (ref 0–16)

## 2013-08-02 LAB — CBC WITH DIFFERENTIAL/PLATELET
Basophils Absolute: 0 10*3/uL (ref 0.0–0.1)
Basophils Relative: 0 % (ref 0–1)
Eosinophils Absolute: 0 10*3/uL (ref 0.0–0.7)
Eosinophils Relative: 0 % (ref 0–5)
HCT: 26.5 % — ABNORMAL LOW (ref 39.0–52.0)
Hemoglobin: 8.6 g/dL — ABNORMAL LOW (ref 13.0–17.0)
Lymphocytes Relative: 33 % (ref 12–46)
Lymphs Abs: 1 10*3/uL (ref 0.7–4.0)
MCH: 34 pg (ref 26.0–34.0)
MCHC: 32.5 g/dL (ref 30.0–36.0)
MCV: 104.7 fL — ABNORMAL HIGH (ref 78.0–100.0)
Monocytes Absolute: 1.3 10*3/uL — ABNORMAL HIGH (ref 0.1–1.0)
Monocytes Relative: 45 % — ABNORMAL HIGH (ref 3–12)
Neutro Abs: 0.6 10*3/uL — ABNORMAL LOW (ref 1.7–7.7)
Neutrophils Relative %: 22 % — ABNORMAL LOW (ref 43–77)
Platelets: 86 10*3/uL — ABNORMAL LOW (ref 150–400)
RBC: 2.53 MIL/uL — ABNORMAL LOW (ref 4.22–5.81)
RDW: 16.1 % — ABNORMAL HIGH (ref 11.5–15.5)
WBC: 2.9 10*3/uL — ABNORMAL LOW (ref 4.0–10.5)

## 2013-08-02 LAB — SAVE SMEAR

## 2013-08-02 NOTE — Progress Notes (Signed)
Hematology and Oncology Follow Up Visit  Joel Macdonald [id number] 27-Jan-1931 80 y.o. 08/02/2013 12:13 PM   Principle Diagnosis: Encounter Diagnoses  Name Primary?  . Vasculitis Yes  . Pancytopenia, acquired   . MDS (myelodysplastic syndrome), low grade   . MGUS (monoclonal gammopathy of unknown significance)   . Ischemia of extremity   . 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 He has a concomitant IgM monoclonal gammopathy of undetermined significance with borderline and stable IgM paraprotein levels. He was in for a routine visit with me on April 6. Please see that the progress note for complete details. He was recently treated for a right lower extremity cellulitis. At the time of the April 6 visit he had developed a left lower extremity cellulitis with significant swelling of the left calf. He had some ischemic changes of the digits of his toes on both feet. He had been on oral antibiotics for about one week but was not improving. He was scheduled to see his family physician the same day. I recommended a course of parenteral antibiotics. He was admitted to the hospital that day for a brief 48 hour treatment with parenteral vancomycin. He was seen in consultation by an infectious disease specialist. An MRI scan was ordered which showed extensive cellulitis but no evidence for abscess in the left calf. He was sent home on a combination of doxycycline and cephalexin. He has noted significant improvement although there is persistent swelling of the calf. He has developed some new areas of abnormality on the skin of his fingers. He has had persistent ischemic changes of his toes bilaterally. He is no longer running any fevers. He is still taking the oral antibiotics.  Medications: reviewed  Allergies:  Allergies   Allergen Reactions  . Other Other (See Comments)    Ragweed causes nasal congestion    Review of Systems: Hematology:  No bleeding or bruising ENT ROS: No sore throat Breast ROS:  Respiratory ROS: No cough or dyspnea Cardiovascular ROS:  No ischemic type chest pain or palpitations Gastrointestinal ROS: No abdominal pain or change in bowel habit. Appetite is not as good as his baseline.   Genito-Urinary ROS: Not questioned Musculoskeletal ROS: No muscle bone or joint pain Neurological ROS: No headache or change in vision. No paresthesias. Dermatological ROS: New skin changes on his hands. Remaining ROS negative:   Physical Exam: Blood pressure 137/82, pulse 84, temperature 96.7 F (35.9 C), temperature source Oral, height 6' 1"  (1.854 m), weight 219 lb (99.338 kg), SpO2 100.00%. Wt Readings from Last 3 Encounters:  08/02/13 219 lb (99.338 kg)  07/20/13 216 lb (97.977 kg)  07/14/13 216 lb 0.8 oz (98 kg)     General appearance: Well-nourished Caucasian man HENNT: Pharynx no erythema, exudate, mass, or ulcer. No thyromegaly or thyroid nodules Lymph nodes: No cervical, supraclavicular, or axillary lymphadenopathy Breasts: Lungs: Clear to auscultation, resonant to percussion throughout Heart: Regular rhythm, no murmur, no gallop, no rub, no click, no edema Abdomen: Soft, nontender, normal bowel sounds, no mass, no organomegaly Extremities: Persistent 3+ edema of the left calf, no calf tenderness. Distal pulses are not palpable  due to edema Musculoskeletal: no joint deformities GU:  Vascular: Carotid pulses 2+, no bruits, distal pulses: Dorsalis pedis not palpable due to edema; no persistent ischemic changes left first and third toes,  right second third and fourth toes with new ischemic changes on the right index and small finger. Radial pulses 2+ symmetric. Neurologic: Alert, oriented, PERRLA, optic discs sharp and vessels normal, no hemorrhage or exudate, cranial nerves grossly  normal, motor strength 5 over 5, reflexes 1+ symmetric, upper body coordination normal, gait normal, Skin: No rash or ecchymosis  Lab Results: CBC W/Diff: Differential 22% neutrophils 33% lymphocytes 45% monocytes.    Component Value Date/Time   WBC 2.9* 08/02/2013 0953   WBC 3.0* 05/17/2013 1258   RBC 2.53* 08/02/2013 0953   RBC 3.01* 05/17/2013 1258   HGB 8.6* 08/02/2013 0953   HGB 10.4* 05/17/2013 1258   HCT 26.5* 08/02/2013 0953   HCT 31.5* 05/17/2013 1258   PLT 86* 08/02/2013 0953   PLT 84* 05/17/2013 1258   MCV 104.7* 08/02/2013 0953   MCV 104.7* 05/17/2013 1258   MCH 34.0 08/02/2013 0953   MCH 34.6* 05/17/2013 1258   MCHC 32.5 08/02/2013 0953   MCHC 33.0 05/17/2013 1258   RDW 16.1* 08/02/2013 0953   RDW 16.0* 05/17/2013 1258   LYMPHSABS 1.0 08/02/2013 0953   LYMPHSABS 1.5 05/17/2013 1258   MONOABS 1.3* 08/02/2013 0953   MONOABS 0.8 05/17/2013 1258   EOSABS 0.0 08/02/2013 0953   EOSABS 0.0 05/17/2013 1258   BASOSABS 0.0 08/02/2013 0953   BASOSABS 0.0 05/17/2013 1258     Chemistry      Component Value Date/Time   NA 140 07/12/2013 0914   NA 139 11/09/2012 1425   K 4.9 07/12/2013 0914   K 4.2 11/09/2012 1425   CL 102 07/12/2013 0914   CO2 23 07/12/2013 0914   CO2 24 11/09/2012 1425   BUN 21 07/12/2013 0914   BUN 22.4 11/09/2012 1425   CREATININE 0.95 07/12/2013 2036   CREATININE 0.94 07/12/2013 0914   CREATININE 1.2 11/09/2012 1425      Component Value Date/Time   CALCIUM 8.9 07/12/2013 0914   CALCIUM 9.3 11/09/2012 1425   ALKPHOS 62 07/12/2013 0914   ALKPHOS 62 11/09/2012 1425   AST 19 07/12/2013 0914   AST 20 11/09/2012 1425   ALT 16 07/12/2013 0914   ALT 19 11/09/2012 1425   BILITOT 0.6 07/12/2013 0914   BILITOT 0.57 11/09/2012 1425    ESR 49 mm   Radiological Studies: Mr Tibia Fibula Left W Wo Contrast  07/13/2013   CLINICAL DATA:  Left lower extremity pain, swelling and redness.  EXAM: MRI OF LOWER LEFT EXTREMITY WITHOUT AND WITH CONTRAST  TECHNIQUE: Multiplanar, multisequence MR imaging of the lower left extremity was  performed both before and after administration of intravenous contrast.  CONTRAST:  14m MULTIHANCE GADOBENATE DIMEGLUMINE 529 MG/ML IV SOLN  COMPARISON:  None.  FINDINGS: Marked diffuse abnormal subcutaneous soft tissue swelling/ edema and fluid involving both legs but the left is much worse on the right. No discrete rim enhancing fluid collection to suggest a drainable abscess. No findings for myofasciitis or pyomyositis. No evidence of septic arthritis or osteomyelitis.  Focal area of signal abnormality in the proximal fibular shaft. Assess low T1 and high T2 signal intensity and may be a focal area of red marrow conversion. This is does not have the appearance of infection or tumor.  IMPRESSION: Severe diffuse cellulitis involving the left lower extremity. Similar but less significant findings involving the right lower extremity.  No findings for myofasciitis, pyomyositis, focal soft tissue abscess, septic arthritis or osteomyelitis.   Electronically Signed   By: MVeneta PentonD.  On: 07/13/2013 16:59    Impression:  #1. Persistent swelling left calf. Inflammatory changes have resolved. However, I am concerned with the persistent swelling. Plan: We need to rule out deep venous thrombosis. I have ordered a duplex venous study to be done today at the vascular surgery office.  #2. Ischemic changes of the digits of his fingers and toes. I am quite concerned that we are dealing with a vasculitis of unclear etiology. I have not seen an association between myelodysplastic syndromes and  vasculitis in my experience. There is no history of hepatitis C. No history of, or signs of,  a collagen vascular disorder. Normal lipid profile and no recent vascular manipulations to suggest "blue toe syndrome". Plan: I'm going to check a cryoglobulin screen and an ANCA screen. I am referring him for a formal vascular surgery consultation. We will likely need to do a biopsy of one of the lesions on the  fingers.  #3. Myelodysplastic syndrome Some fluctuation in his blood counts but overall similar to his baseline.  #4. Pancytopenia secondary to #3.  #5. IgM monoclonal gammopathy undetermined significance   CC: Patient Care Team: Lajean Manes, MD as PCP - General (Internal Medicine)   Annia Belt, MD 4/27/201512:13 PM

## 2013-08-02 NOTE — Patient Instructions (Signed)
Lab today Urgent referral to vascular surgery for venous doppler left lower extremity and evaluation of ischemic changes of fingers and toes Return visit with [name] for Tuesday May 5th at 1:30 PM  30 minutes

## 2013-08-03 ENCOUNTER — Encounter: Payer: Self-pay | Admitting: Vascular Surgery

## 2013-08-03 ENCOUNTER — Ambulatory Visit (HOSPITAL_COMMUNITY)
Admission: RE | Admit: 2013-08-03 | Discharge: 2013-08-03 | Disposition: A | Discharge status: Home / Self Care | Payer: Medicare Other | Source: Ambulatory Visit | Attending: Vascular Surgery | Admitting: Vascular Surgery

## 2013-08-03 ENCOUNTER — Other Ambulatory Visit: Payer: Self-pay | Admitting: Vascular Surgery

## 2013-08-03 ENCOUNTER — Ambulatory Visit (INDEPENDENT_AMBULATORY_CARE_PROVIDER_SITE_OTHER): Payer: Medicare Other | Admitting: Vascular Surgery

## 2013-08-03 ENCOUNTER — Other Ambulatory Visit: Payer: Self-pay | Admitting: Oncology

## 2013-08-03 VITALS — BP 138/79 | HR 84 | Ht 73.0 in | Wt 217.0 lb

## 2013-08-03 DIAGNOSIS — I999 Unspecified disorder of circulatory system: Secondary | ICD-10-CM

## 2013-08-03 DIAGNOSIS — I872 Venous insufficiency (chronic) (peripheral): Secondary | ICD-10-CM | POA: Insufficient documentation

## 2013-08-03 DIAGNOSIS — I739 Peripheral vascular disease, unspecified: Secondary | ICD-10-CM

## 2013-08-03 DIAGNOSIS — R0989 Other specified symptoms and signs involving the circulatory and respiratory systems: Secondary | ICD-10-CM | POA: Insufficient documentation

## 2013-08-03 LAB — ANCA SCREEN W REFLEX TITER
Atypical p-ANCA Screen: NEGATIVE
P-ANCA SCREEN: NEGATIVE
c-ANCA Screen: NEGATIVE

## 2013-08-03 NOTE — Progress Notes (Signed)
Patient name: EULIS SALAZAR MRN: [id number] DOB: [birth year] Sex: male   Referred by: Beryle Beams  Reason for referral:  Chief Complaint  Patient presents with  . 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 He has a unusual history. Initially for 6 weeks ago he had an episode of swelling and probable saline this in his right calf and ankle. This resolved relatively quickly that he had a similar episode in his left calf and ankle. He has had a persistent cellulitis and ice he had oral antibiotics and and a brief hospitalization on vancomycin for the cellulitis. This is been very slow to resolve. No history of arterial insufficiency and has no prior history of DVT or other venous pathology. He does have a history of Raynaud phenomena in his hand for many years. He does have pancytopenia. Has no history of cardiac disease. Does have a long history of leg swelling which is not require treatment.  Past Medical History  Diagnosis Date  . MGUS (monoclonal gammopathy of unknown significance) 06/17/2011  . Cellulitis of leg, right 07/12/2013  . Ischemia of extremity 07/12/2013    Bilateral toes  2,3,4 right; 1,3 left  Vascular doppler normal for large vessel occlusion 2/15  . Heart murmur 1936  . Pancytopenia, acquired 06/17/2011    "from my Myelodysplastic syndrome"  . GERD (gastroesophageal reflux disease)   . Osteoarthritis     "back, legs, arms, shoulders" (07/12/2013)  . MDS (myelodysplastic syndrome), low grade 09/20/2011    Bone marrow bx 06/13/11: mild dyserythro/dymegakaryopoiesis.  Normal cytogenetics.  FISH negative for chrom 5 or 7 deletions.  Scattered small infiltrates of plasmacytoid lymphs  . Prostate cancer     Past Surgical History  Procedure Laterality Date  . Tonsillectomy and adenoidectomy  ~ 1939  . Robot assisted  laparoscopic radical prostatectomy  ~ 2010  . Cataract extraction w/ intraocular lens  implant, bilateral Bilateral ~ 2012    History   Social History  . Marital Status: Married    Spouse Name: N/A    Number of Children: N/A  . Years of Education: N/A   Occupational History  . Not on file.   Social History Main Topics  . Smoking status: Former Smoker -- 40 years    Types: Pipe    Quit date: 07/12/1993  . Smokeless tobacco: Never Used  . Alcohol Use: 5.4 - 11.4 oz/week    5-7 Glasses of wine, 0-2 Cans of beer, 4-10 Shots of liquor per week     Comment: 07/12/2013 "couple drinks/day; wine,  shots"  . Drug Use: No  . Sexual Activity: No   Other Topics Concern  . Not on file   Social History Narrative  . No narrative on file    Family History  Problem Relation Age of Onset  . Other Mother   . Other Father   . Heart disease Father   . Hyperlipidemia Son     Allergies as of 08/03/2013 - Review Complete 08/03/2013  Allergen Reaction Noted  . Other Other (See Comments) 03/20/2012    Current Outpatient Prescriptions on File Prior to Visit  Medication Sig Dispense Refill  . aspirin-sod bicarb-citric acid (ALKA-SELTZER) 325 MG TBEF Take 650 mg by mouth every 6 (six) hours as needed (sore thoart).       . cephALEXin (  KEFLEX) 500 MG capsule Take 1 capsule (500 mg total) by mouth 4 (four) times daily.  40 capsule  0  . clobetasol ointment (TEMOVATE) 3.78 % Apply 1 application topically 2 (two) times daily. Apply to psoriasis      . doxycycline (VIBRAMYCIN) 100 MG capsule Take 1 capsule (100 mg total) by mouth 2 (two) times daily. For 10 days  20 capsule  0  . ibuprofen (ADVIL,MOTRIN) 200 MG tablet Take 400 mg by mouth every 6 (six) hours as needed for mild pain. Takes 400-600mg       . ranitidine (ZANTAC) 150 MG tablet Take 150 mg by mouth as needed for heartburn.        No current facility-administered medications on file prior to visit.     REVIEW OF SYSTEMS:  Positives  indicated with an "X"  CARDIOVASCULAR:  [ ]  chest pain   [ ]  chest pressure   [ ]  palpitations   [ ]  orthopnea   [ ]  dyspnea on exertion   [ ]  claudication   [ ]  rest pain   [ ]  DVT   [ ]  phlebitis PULMONARY:   [ ]  productive cough   [ ]  asthma   [ ]  wheezing NEUROLOGIC:   [ ]  weakness  [ ]  paresthesias  [ ]  aphasia  [ ]  amaurosis  [ ]  dizziness HEMATOLOGIC:   [ ]  bleeding problems   [ ]  clotting disorders x myelodysplastic syndrome MUSCULOSKELETAL:  [ ]  joint pain   [ ]  joint swelling GASTROINTESTINAL: [ ]   blood in stool  [ ]   hematemesis GENITOURINARY:  [ ]   dysuria  [ ]   hematuria PSYCHIATRIC:  [ ]  history of major depression INTEGUMENTARY:  [ ]  rashes  [ ]  ulcers CONSTITUTIONAL:  [x ] fever   [ x] chills  PHYSICAL EXAMINATION:  General: The patient is a well-nourished male, in no acute distress. Vital signs are BP 138/79  Pulse 84  Ht 6\' 1"  (1.854 m)  Wt 217 lb (98.431 kg)  BMI 28.64 kg/m2  SpO2 99% Pulmonary: There is a good air exchange   Musculoskeletal: There are no major deformities.  There is no significant extremity pain. Neurologic: No focal weakness or paresthesias are detected, Skin: Does have scaling and cracking more so in his left leg with some nonhealing ulcerations. Significant swelling left versus right. Does have cyanosis of the toes of both feet more so on the left than on the right Psychiatric: The patient has normal affect. Cardiovascular: 2+ radial 2+ femoral 2+ popliteal and 2+ dorsalis pedis pulses bilaterally   [facility] Vascular Lab Studies:  Ordered and Independently Reviewed . I reviewed these with the patient. He has had this studies at the [facility] for imaging and also in our lab. His lower extremity arterial studies are completely normal with triphasic waveforms and normal digital pressures and lower extremity pressures. He did have exercise study a month ago it [facility] imaging with a normal exercise study as well. Left leg venous study in our office  yesterday revealed no evidence of DVT or superficial thrombophlebitis of the left leg he denied any evidence of venous reflux. Did have some enlarged nodes in his left groin most likely related to cellulitis  Impression and Plan:  Completed normal arterial and venous study. Very unclear as to the etiology of this. This is not clinically lymphedema. I clears etiology of recurrent episodes of cellulitis which are causing the swelling. I discussed this at length with the patient and his  wife present. Recommend continued observation only. He may benefit from knee-high graduated compression garments once he has resolved the inflammation. He was reassured this discussion will see [place] again on an as-needed basis    Arvilla Meres [facility] Office: [phone]

## 2013-08-07 LAB — CRYOGLOBULIN

## 2013-08-10 ENCOUNTER — Telehealth: Payer: Self-pay | Admitting: *Deleted

## 2013-08-10 ENCOUNTER — Ambulatory Visit (INDEPENDENT_AMBULATORY_CARE_PROVIDER_SITE_OTHER): Payer: Medicare Other | Admitting: Oncology

## 2013-08-10 ENCOUNTER — Encounter: Payer: Self-pay | Admitting: Oncology

## 2013-08-10 VITALS — BP 138/74 | HR 97 | Temp 97.2°F | Ht 73.0 in | Wt 216.4 lb

## 2013-08-10 DIAGNOSIS — D472 Monoclonal gammopathy: Secondary | ICD-10-CM

## 2013-08-10 DIAGNOSIS — D61818 Other pancytopenia: Secondary | ICD-10-CM

## 2013-08-10 DIAGNOSIS — I776 Arteritis, unspecified: Secondary | ICD-10-CM

## 2013-08-10 DIAGNOSIS — D469 Myelodysplastic syndrome, unspecified: Secondary | ICD-10-CM

## 2013-08-10 DIAGNOSIS — L03116 Cellulitis of left lower limb: Secondary | ICD-10-CM

## 2013-08-10 DIAGNOSIS — I998 Other disorder of circulatory system: Secondary | ICD-10-CM

## 2013-08-10 NOTE — Telephone Encounter (Signed)
Message copied by Ebbie Latus on Tue Aug 10, 2013 12:12 PM ------      Message from: Annia Belt      Created: Mon Aug 09, 2013  3:25 PM       Call patient: screening tests for vasculitis and cryoglobulins are normal. ------

## 2013-08-10 NOTE — Patient Instructions (Signed)
Continue every 2 month lab checks - next June 8 then  August 3 Follow up with [name] 30 minutes 8/10

## 2013-08-10 NOTE — Progress Notes (Signed)
Patient ID: Joel Macdonald, male   DOB: [birth year], 80 y.o.   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 He has been transfusion independent so far. He never had problems with recurrent infections until very recently when he has developed sequential right then left lower extremity cellulitis requiring multiple courses of antibiotics. He has had a number of vascular evaluations to rule out arterial and venous insufficiency or thrombosis including a formal vascular surgery consultation, and an MRI of the left lower extremity. He has made progress although there is persistent pedal and calf edema bilaterally much greater on the left than the right. He has had persistent cyanotic changes of the toes on both feet with additional areas of abnormality on the dorsa of his fingers which I thought were beginning to ulcerate. I was concerned with the possibility of that he had developed a vasculitis. Erythrocyte sedimentation rate is elevated at 49 mm but this is non-specific. He does have a monoclonal IgM gammopathy of undetermined significance but the IgM is only borderline elevated and has not gotten any higher on repeat testing. I screened him for presence of cryoglobulins which were not detected. I checked an ANCA for vasculitis and this was also negative. The vascular surgeon was not particularly concerned about the ischemic changes of his toes. Since his visit with me last week, he has seen his dermatologist Dr. Jarome Matin. By the time he saw Dr. Ronnald Ramp, there was significant healing of the lesions on his fingers which now appear eczematoid.   His internist saw him last week as well and heard a new systolic murmur. He ordered an echocardiogram. Of note, he had a brief hospital stay when the cellulitis was at its peak. Blood cultures done during that admission were negative and he has  not been running any fevers or having other constitutional symptoms to suggest endocarditis as the etiology of the vasculitic changes of his extremities.  On my exam today, he is afebrile 97.2 Lungs are clear. Regular cardiac rhythm. 9-8/3 apical systolic murmur Persistent asymmetric edema much greater on the left than the right. Calves are nontender. Persistent ischemic changes of most of the toes bilaterally. Resolved skin changes over the dorsa of his hands.  Pertinent lab: See discussion above. No additional lab testing today.  Impression: #1. Myelodysplastic syndrome Trend for progressive anemia but due to recent acute illness my impression is that his counts will probably recover to his baseline. No indication for transfusion or growth factors at this time. I will continue to monitor his blood counts closely.  #2. Recent bilateral cellulitis He may be getting infection due to the neutrophil dysfunction associated with his MDS. Active infection now resolved. Persistent significant residual asymmetric edema remains unexplained.  #3. Ischemic changes of his toes This remains extremely bothersome to me but at this point given the negative venous and arterial evaluations and negative lab data outlined above, I have no alternative other than continue close observation. I have alerted the patient to call me if there are any progressive changes.

## 2013-08-10 NOTE — Telephone Encounter (Signed)
Called pt - pt informed tests for vasculitis and cryoglobulins are nl per [name]. Pt has an appt today.

## 2013-08-23 ENCOUNTER — Ambulatory Visit (HOSPITAL_COMMUNITY): Payer: Medicare Other | Attending: Cardiovascular Disease | Admitting: Cardiology

## 2013-08-23 ENCOUNTER — Other Ambulatory Visit (HOSPITAL_COMMUNITY): Payer: Self-pay | Admitting: Geriatric Medicine

## 2013-08-23 DIAGNOSIS — R011 Cardiac murmur, unspecified: Secondary | ICD-10-CM

## 2013-08-23 DIAGNOSIS — R609 Edema, unspecified: Secondary | ICD-10-CM

## 2013-08-23 NOTE — Progress Notes (Signed)
Echo performed. 

## 2013-09-13 ENCOUNTER — Other Ambulatory Visit (INDEPENDENT_AMBULATORY_CARE_PROVIDER_SITE_OTHER): Payer: Medicare Other

## 2013-09-13 ENCOUNTER — Telehealth: Payer: Self-pay | Admitting: *Deleted

## 2013-09-13 DIAGNOSIS — L03116 Cellulitis of left lower limb: Secondary | ICD-10-CM

## 2013-09-13 DIAGNOSIS — D4620 Refractory anemia with excess of blasts, unspecified: Secondary | ICD-10-CM

## 2013-09-13 DIAGNOSIS — L03119 Cellulitis of unspecified part of limb: Secondary | ICD-10-CM

## 2013-09-13 DIAGNOSIS — L02419 Cutaneous abscess of limb, unspecified: Secondary | ICD-10-CM

## 2013-09-13 LAB — CBC WITH DIFFERENTIAL/PLATELET
BASOS PCT: 0 % (ref 0–1)
Basophils Absolute: 0 10*3/uL (ref 0.0–0.1)
EOS ABS: 0 10*3/uL (ref 0.0–0.7)
Eosinophils Relative: 0 % (ref 0–5)
HCT: 29.7 % — ABNORMAL LOW (ref 39.0–52.0)
Hemoglobin: 9.8 g/dL — ABNORMAL LOW (ref 13.0–17.0)
LYMPHS ABS: 1.6 10*3/uL (ref 0.7–4.0)
Lymphocytes Relative: 49 % — ABNORMAL HIGH (ref 12–46)
MCH: 34.5 pg — AB (ref 26.0–34.0)
MCHC: 33 g/dL (ref 30.0–36.0)
MCV: 104.6 fL — AB (ref 78.0–100.0)
Monocytes Absolute: 1 10*3/uL (ref 0.1–1.0)
Monocytes Relative: 32 % — ABNORMAL HIGH (ref 3–12)
Neutro Abs: 0.6 10*3/uL — ABNORMAL LOW (ref 1.7–7.7)
Neutrophils Relative %: 19 % — ABNORMAL LOW (ref 43–77)
PLATELETS: 101 10*3/uL — AB (ref 150–400)
RBC: 2.84 MIL/uL — AB (ref 4.22–5.81)
RDW: 15.9 % — ABNORMAL HIGH (ref 11.5–15.5)
WBC: 3.2 10*3/uL — ABNORMAL LOW (ref 4.0–10.5)

## 2013-09-13 LAB — SAVE SMEAR

## 2013-09-13 NOTE — Telephone Encounter (Signed)
Pt notified per Dr. Beryle Beams that his CBC was "stable compared with previous" lab. Pt states he just saw his lab results on the computer (Pt portal) and actually thought they were improved. Pt also confirmed his next appt to be 11/08/13 for labs, "right before he goes to the beach." Yvonna Alanis, RN, 09/13/13, 3:37 PM

## 2013-09-14 LAB — PATHOLOGIST SMEAR REVIEW

## 2013-11-08 ENCOUNTER — Other Ambulatory Visit (INDEPENDENT_AMBULATORY_CARE_PROVIDER_SITE_OTHER): Payer: Medicare Other

## 2013-11-08 DIAGNOSIS — L03119 Cellulitis of unspecified part of limb: Secondary | ICD-10-CM

## 2013-11-08 DIAGNOSIS — L03116 Cellulitis of left lower limb: Secondary | ICD-10-CM

## 2013-11-08 DIAGNOSIS — L02419 Cutaneous abscess of limb, unspecified: Secondary | ICD-10-CM

## 2013-11-08 DIAGNOSIS — D4620 Refractory anemia with excess of blasts, unspecified: Secondary | ICD-10-CM

## 2013-11-08 LAB — CBC WITH DIFFERENTIAL/PLATELET
Basophils Absolute: 0 10*3/uL (ref 0.0–0.1)
Basophils Relative: 0 % (ref 0–1)
EOS ABS: 0 10*3/uL (ref 0.0–0.7)
EOS PCT: 0 % (ref 0–5)
HCT: 32.2 % — ABNORMAL LOW (ref 39.0–52.0)
Hemoglobin: 10.6 g/dL — ABNORMAL LOW (ref 13.0–17.0)
Lymphocytes Relative: 40 % (ref 12–46)
Lymphs Abs: 1.2 10*3/uL (ref 0.7–4.0)
MCH: 34.6 pg — AB (ref 26.0–34.0)
MCHC: 32.9 g/dL (ref 30.0–36.0)
MCV: 105.2 fL — AB (ref 78.0–100.0)
MONOS PCT: 43 % — AB (ref 3–12)
Monocytes Absolute: 1.2 10*3/uL — ABNORMAL HIGH (ref 0.1–1.0)
Neutro Abs: 0.5 10*3/uL — ABNORMAL LOW (ref 1.7–7.7)
Neutrophils Relative %: 17 % — ABNORMAL LOW (ref 43–77)
PLATELETS: 100 10*3/uL — AB (ref 150–400)
RBC: 3.06 MIL/uL — ABNORMAL LOW (ref 4.22–5.81)
RDW: 15.8 % — ABNORMAL HIGH (ref 11.5–15.5)
WBC: 2.9 10*3/uL — ABNORMAL LOW (ref 4.0–10.5)

## 2013-11-08 LAB — SAVE SMEAR

## 2013-11-09 ENCOUNTER — Telehealth: Payer: Self-pay | Admitting: *Deleted

## 2013-11-09 NOTE — Telephone Encounter (Signed)
Pt notified per Dr. Beryle Beams that "Blood counts similar to previous - no concerns" - pt states he saw his results on MY CHART and was encouraged that his Hgb was higher and confirmed his 11/15/13 appt with Dr. Beryle Beams. Tula Schryver,RN, 11/09/13, 10: 30 AM

## 2013-11-15 ENCOUNTER — Ambulatory Visit (INDEPENDENT_AMBULATORY_CARE_PROVIDER_SITE_OTHER): Payer: Medicare Other | Admitting: Oncology

## 2013-11-15 ENCOUNTER — Encounter: Payer: Self-pay | Admitting: Oncology

## 2013-11-15 VITALS — BP 140/80 | HR 77 | Temp 97.0°F | Ht 73.0 in | Wt 221.7 lb

## 2013-11-15 DIAGNOSIS — C61 Malignant neoplasm of prostate: Secondary | ICD-10-CM

## 2013-11-15 DIAGNOSIS — L03115 Cellulitis of right lower limb: Secondary | ICD-10-CM

## 2013-11-15 DIAGNOSIS — L03119 Cellulitis of unspecified part of limb: Secondary | ICD-10-CM

## 2013-11-15 DIAGNOSIS — D4620 Refractory anemia with excess of blasts, unspecified: Secondary | ICD-10-CM

## 2013-11-15 DIAGNOSIS — D61818 Other pancytopenia: Secondary | ICD-10-CM

## 2013-11-15 DIAGNOSIS — L02419 Cutaneous abscess of limb, unspecified: Secondary | ICD-10-CM

## 2013-11-15 DIAGNOSIS — I776 Arteritis, unspecified: Secondary | ICD-10-CM

## 2013-11-15 DIAGNOSIS — D472 Monoclonal gammopathy: Secondary | ICD-10-CM

## 2013-11-15 NOTE — Progress Notes (Signed)
Patient ID: Joel Macdonald, male   DOB: 09/09/1930, 80 y.o.   MRN: [id number] Hematology and Oncology Follow Up Visit  KRISTIAN MOGG [id number] 1930/10/22 80 y.o. 11/15/2013 9:03 AM   Principle Diagnosis: Encounter Diagnoses  Name Primary?  Marland Kitchen MGUS (monoclonal gammopathy of unknown significance) Yes  . MDS (myelodysplastic syndrome), low grade   . Pancytopenia, acquired   . ADENOCARCINOMA, PROSTATE, GLEASON GRADE 7   . Cellulitis of leg, right   . 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 He has an intermediate risk myelodysplastic syndrome characterized by slowly progressive pancytopenia diagnosed in February 2013. Bone marrow biopsy with no excess blasts at that time. No increased plasma cells or lymphocytes despite concomitant presence of an IgM monoclonal gammopathy. Normal cytogenetics. He has been followed with observation alone. He has had slowly progressive anemia. White count differential shows slowly progressive decrease in neutrophil percentage over time. Persistent monocytosis 40-45%. Occasional early myeloid and erythroid cells on peripheral blood film including myelocytes, metamyelocytes, and nucleated red cells. In December 2014 he started to develop recurrent infections. The initial flu like illness with bronchitis in December 2014. Subsequent to that he began to develop recurrent bilateral cellulitis of his ankles and tibial areas. He has signs of distal ischemia of some of the digits of his toes. No large vessel compromise on arterial Doppler studies. Cryoglobulin screen negative. He has developed chronic ankle and pedal edema with no evidence for venous thrombosis on multiple venous Doppler studies. Over the last year he has been treated with multiple courses of antibiotics for recurrent cellulitis. He had a brief hospitalization in April. MRI scan did not show any evidence for deep soft tissue or bone  infection. He has had another flareup of superficial cellulitis of his right lower extremity and was put back on Septra on August 5.  Medications: reviewed  Allergies:  Allergies  Allergen Reactions  . Other Other (See Comments)    Ragweed causes nasal congestion    Review of Systems: Hematology:  Persistent ecchymoses of his upper extremities ENT ROS: No sore throat Breast ROS:  Respiratory ROS: No cough Cardiovascular ROS:  No chest pain or dyspnea Gastrointestinal ROS: No abdominal pain or change in bowel habit   Genito-Urinary ROS: Annual visit with urology with respect to history of prostate cancer Musculoskeletal ROS: No muscle bone or joint pain Neurological ROS: No headache or change in vision Dermatological ROS: No rash. Remaining ROS negative:   Physical Exam: Blood pressure 140/80, pulse 77, temperature 97 F (36.1 C), temperature source Oral, height 6' 1"  (1.854 m), weight 221 lb 11.2 oz (100.562 kg), SpO2 99.00%. Wt Readings from Last 3 Encounters:  11/15/13 221 lb 11.2 oz (100.562 kg)  08/10/13 216 lb 6.4 oz (98.158 kg)  08/03/13 217 lb (98.431 kg)     General appearance: Well-nourished Caucasian man HENNT: Pharynx no erythema, exudate, mass, or ulcer. No thyromegaly or thyroid nodules Lymph nodes: No cervical, supraclavicular, or axillary lymphadenopathy Breasts: Lungs: Clear to auscultation, resonant to percussion throughout Heart: Regular rhythm, no murmur, no gallop, no rub, no click, no edema Abdomen: Soft, nontender, normal bowel sounds, no mass, no organomegaly Extremities: 2+ ankle and pedal edema, asymmetric left greater than right no calf tenderness Musculoskeletal: no joint deformities GU:  Vascular: Carotid pulses 2+, no bruits, distal pulses: Dorsalis pedis pulses not palpable but feet warm and well perfused. Pt and second toesersistent ischemic changes left  first and third toes Neurologic: Alert, oriented, PERRLA, optic discs sharp and vessels  normal, no hemorrhage or exudate, cranial nerves grossly normal, motor strength 5 over 5, reflexes 1+ symmetric, upper body coordination normal, gait normal, Skin: Upper extremities with multiple ecchymoses on the skin of his arms and hands. Lower extremities with a localized circular area of superficial cellulitis lateral aspect proximal foot at junction between foot and ankle.  Lab Results: CBC W/Diff    Component Value Date/Time   WBC 2.9* 11/08/2013 0931   WBC 3.0* 05/17/2013 1258   RBC 3.06* 11/08/2013 0931   RBC 3.01* 05/17/2013 1258   HGB 10.6* 11/08/2013 0931   HGB 10.4* 05/17/2013 1258   HCT 32.2* 11/08/2013 0931   HCT 31.5* 05/17/2013 1258   PLT 100* 11/08/2013 0931   PLT 84* 05/17/2013 1258   MCV 105.2* 11/08/2013 0931   MCV 104.7* 05/17/2013 1258   MCH 34.6* 11/08/2013 0931   MCH 34.6* 05/17/2013 1258   MCHC 32.9 11/08/2013 0931   MCHC 33.0 05/17/2013 1258   RDW 15.8* 11/08/2013 0931   RDW 16.0* 05/17/2013 1258   LYMPHSABS 1.2 11/08/2013 0931   LYMPHSABS 1.5 05/17/2013 1258   MONOABS 1.2* 11/08/2013 0931   MONOABS 0.8 05/17/2013 1258   EOSABS 0.0 11/08/2013 0931   EOSABS 0.0 05/17/2013 1258   BASOSABS 0.0 11/08/2013 0931   BASOSABS 0.0 05/17/2013 1258     Chemistry      Component Value Date/Time   NA 140 07/12/2013 0914   NA 139 11/09/2012 1425   K 4.9 07/12/2013 0914   K 4.2 11/09/2012 1425   CL 102 07/12/2013 0914   CO2 23 07/12/2013 0914   CO2 24 11/09/2012 1425   BUN 21 07/12/2013 0914   BUN 22.4 11/09/2012 1425   CREATININE 0.95 07/12/2013 2036   CREATININE 0.94 07/12/2013 0914   CREATININE 1.2 11/09/2012 1425      Component Value Date/Time   CALCIUM 8.9 07/12/2013 0914   CALCIUM 9.3 11/09/2012 1425   ALKPHOS 62 07/12/2013 0914   ALKPHOS 62 11/09/2012 1425   AST 19 07/12/2013 0914   AST 20 11/09/2012 1425   ALT 16 07/12/2013 0914   ALT 19 11/09/2012 1425   BILITOT 0.6 07/12/2013 0914   BILITOT 0.57 11/09/2012 1425     Review of peripheral blood pending.  Impression:  #1. Intermediate risk Myelodysplastic syndrome  Hemoglobin  has stabilized back to his baseline of 10 g. Slowly progressive decrease in neutrophils. I will continue to monitor blood counts every 2 months. Repeat bone marrow biopsy if any significant change and then consider more definitive treatment.  #2. Recurrent bilateral cellulitis  Likely getting infection due to the neutrophil dysfunction associated with his MDS. Back on oral Septra at this time.  #3. Persistent asymmetric edema remains unexplained. This may be secondary to age related venous valve incompetence He might benefit by going on a diuretic to decrease lower extremity edema so there is better venous blood flow which might impact on the recurrent episodes of cellulitis. He will discuss this with his internist tomorrow.  #4. Persistent Ischemic changes of his toes  #5. IgM monoclonal gammopathy of undetermined significance Stable paraprotein levels through 07/12/2013.     CC: Patient Care Team: Lajean Manes, MD as PCP - General (Internal Medicine) Annia Belt, MD as Consulting Physician (Oncology)   Annia Belt, MD 8/10/20159:03 AM

## 2013-11-15 NOTE — Patient Instructions (Signed)
Continue every 2 month lab checks MD visit with Dr Darnell Level 12/14 @ 10: 15

## 2014-01-17 ENCOUNTER — Other Ambulatory Visit (INDEPENDENT_AMBULATORY_CARE_PROVIDER_SITE_OTHER): Payer: Medicare Other

## 2014-01-17 DIAGNOSIS — D4620 Refractory anemia with excess of blasts, unspecified: Secondary | ICD-10-CM

## 2014-01-17 DIAGNOSIS — L03116 Cellulitis of left lower limb: Secondary | ICD-10-CM

## 2014-01-17 DIAGNOSIS — D469 Myelodysplastic syndrome, unspecified: Secondary | ICD-10-CM

## 2014-01-17 LAB — CBC WITH DIFFERENTIAL/PLATELET
Basophils Absolute: 0 10*3/uL (ref 0.0–0.1)
Basophils Relative: 0 % (ref 0–1)
EOS ABS: 0 10*3/uL (ref 0.0–0.7)
Eosinophils Relative: 0 % (ref 0–5)
HEMATOCRIT: 30.9 % — AB (ref 39.0–52.0)
HEMOGLOBIN: 10.2 g/dL — AB (ref 13.0–17.0)
Lymphocytes Relative: 30 % (ref 12–46)
Lymphs Abs: 1.1 10*3/uL (ref 0.7–4.0)
MCH: 34.8 pg — AB (ref 26.0–34.0)
MCHC: 33 g/dL (ref 30.0–36.0)
MCV: 105.5 fL — ABNORMAL HIGH (ref 78.0–100.0)
MONO ABS: 1.7 10*3/uL — AB (ref 0.1–1.0)
Monocytes Relative: 44 % — ABNORMAL HIGH (ref 3–12)
Neutro Abs: 1 10*3/uL — ABNORMAL LOW (ref 1.7–7.7)
Neutrophils Relative %: 26 % — ABNORMAL LOW (ref 43–77)
Platelets: 98 10*3/uL — ABNORMAL LOW (ref 150–400)
RBC: 2.93 MIL/uL — ABNORMAL LOW (ref 4.22–5.81)
RDW: 16.2 % — ABNORMAL HIGH (ref 11.5–15.5)
WBC: 3.8 10*3/uL — ABNORMAL LOW (ref 4.0–10.5)

## 2014-01-17 LAB — SAVE SMEAR

## 2014-01-18 ENCOUNTER — Telehealth: Payer: Self-pay | Admitting: *Deleted

## 2014-01-18 NOTE — Telephone Encounter (Signed)
Message copied by Ebbie Latus on Tue Jan 18, 2014  9:17 AM ------      Message from: Annia Belt      Created: Mon Jan 17, 2014  3:03 PM       Call [name]: CBC about the same as last time - stable ------

## 2014-01-18 NOTE — Telephone Encounter (Signed)
Pt called/informed CBC stable per [name]; stated he had viewed his results on My Chart last night. And he will see [place] in December for his appt.

## 2014-03-09 ENCOUNTER — Other Ambulatory Visit (HOSPITAL_COMMUNITY): Payer: Self-pay | Admitting: Geriatric Medicine

## 2014-03-09 DIAGNOSIS — R609 Edema, unspecified: Secondary | ICD-10-CM

## 2014-03-10 ENCOUNTER — Ambulatory Visit (HOSPITAL_COMMUNITY)
Admission: RE | Admit: 2014-03-10 | Discharge: 2014-03-10 | Disposition: A | Discharge status: Home / Self Care | Payer: Medicare Other | Source: Ambulatory Visit | Attending: Geriatric Medicine | Admitting: Geriatric Medicine

## 2014-03-10 DIAGNOSIS — I10 Essential (primary) hypertension: Secondary | ICD-10-CM | POA: Diagnosis not present

## 2014-03-10 DIAGNOSIS — R609 Edema, unspecified: Secondary | ICD-10-CM | POA: Insufficient documentation

## 2014-03-10 NOTE — Progress Notes (Signed)
Echocardiogram 2D Echocardiogram has been performed.  Joelene Millin 03/10/2014, 9:59 AM

## 2014-03-18 ENCOUNTER — Ambulatory Visit (HOSPITAL_COMMUNITY)
Admission: RE | Admit: 2014-03-18 | Discharge: 2014-03-18 | Disposition: A | Discharge status: Home / Self Care | Payer: Medicare Other | Source: Ambulatory Visit | Attending: Geriatric Medicine | Admitting: Geriatric Medicine

## 2014-03-18 ENCOUNTER — Other Ambulatory Visit (HOSPITAL_COMMUNITY): Payer: Self-pay | Admitting: Geriatric Medicine

## 2014-03-18 DIAGNOSIS — M79606 Pain in leg, unspecified: Secondary | ICD-10-CM

## 2014-03-18 DIAGNOSIS — M7989 Other specified soft tissue disorders: Secondary | ICD-10-CM

## 2014-03-18 NOTE — Progress Notes (Signed)
*  Preliminary Results* Bilateral lower extremity venous duplex completed. Study was technically limited due to significant interstitial fluid. Visualized veins of bilateral lower extremities are negative for deep vein thrombosis. There is no evidence of Baker's cyst bilaterally.   Incidental finding: There are multiple lymph nodes visualized in bilateral groins, the largest of which is 3.5cm in the right groin.  03/18/2014  Maudry Mayhew, RVT, RDCS, RDMS

## 2014-03-21 ENCOUNTER — Other Ambulatory Visit: Payer: Self-pay | Admitting: Oncology

## 2014-03-21 ENCOUNTER — Encounter: Payer: Self-pay | Admitting: Oncology

## 2014-03-21 ENCOUNTER — Telehealth: Payer: Self-pay | Admitting: Oncology

## 2014-03-21 ENCOUNTER — Ambulatory Visit (INDEPENDENT_AMBULATORY_CARE_PROVIDER_SITE_OTHER): Payer: Medicare Other | Admitting: Oncology

## 2014-03-21 VITALS — BP 133/70 | HR 79 | Temp 97.6°F | Ht 73.0 in | Wt 226.7 lb

## 2014-03-21 DIAGNOSIS — D61818 Other pancytopenia: Secondary | ICD-10-CM

## 2014-03-21 DIAGNOSIS — R609 Edema, unspecified: Secondary | ICD-10-CM

## 2014-03-21 DIAGNOSIS — D46Z Other myelodysplastic syndromes: Secondary | ICD-10-CM

## 2014-03-21 DIAGNOSIS — D4620 Refractory anemia with excess of blasts, unspecified: Secondary | ICD-10-CM

## 2014-03-21 DIAGNOSIS — D72821 Monocytosis (symptomatic): Secondary | ICD-10-CM

## 2014-03-21 DIAGNOSIS — D472 Monoclonal gammopathy: Secondary | ICD-10-CM

## 2014-03-21 DIAGNOSIS — L03116 Cellulitis of left lower limb: Secondary | ICD-10-CM

## 2014-03-21 DIAGNOSIS — R600 Localized edema: Secondary | ICD-10-CM

## 2014-03-21 HISTORY — DX: Edema, unspecified: R60.9

## 2014-03-21 HISTORY — DX: Localized edema: R60.0

## 2014-03-21 HISTORY — DX: Monocytosis (symptomatic): D72.821

## 2014-03-21 LAB — CBC WITH DIFFERENTIAL/PLATELET
Basophils Absolute: 0 10*3/uL (ref 0.0–0.1)
Basophils Relative: 0 % (ref 0–1)
Eosinophils Absolute: 0 10*3/uL (ref 0.0–0.7)
Eosinophils Relative: 0 % (ref 0–5)
HCT: 22.3 % — ABNORMAL LOW (ref 39.0–52.0)
Hemoglobin: 7.1 g/dL — ABNORMAL LOW (ref 13.0–17.0)
Lymphocytes Relative: 27 % (ref 12–46)
Lymphs Abs: 0.9 10*3/uL (ref 0.7–4.0)
MCH: 33.2 pg (ref 26.0–34.0)
MCHC: 31.8 g/dL (ref 30.0–36.0)
MCV: 104.2 fL — ABNORMAL HIGH (ref 78.0–100.0)
MPV: 12.2 fL (ref 9.4–12.4)
Monocytes Absolute: 1.8 10*3/uL — ABNORMAL HIGH (ref 0.1–1.0)
Monocytes Relative: 51 % — ABNORMAL HIGH (ref 3–12)
Neutro Abs: 0.8 10*3/uL — ABNORMAL LOW (ref 1.7–7.7)
Neutrophils Relative %: 22 % — ABNORMAL LOW (ref 43–77)
Platelets: 171 10*3/uL (ref 150–400)
RBC: 2.14 MIL/uL — ABNORMAL LOW (ref 4.22–5.81)
RDW: 16.2 % — ABNORMAL HIGH (ref 11.5–15.5)
WBC: 3.5 10*3/uL — ABNORMAL LOW (ref 4.0–10.5)

## 2014-03-21 LAB — BASIC METABOLIC PANEL WITH GFR
BUN: 27 mg/dL — ABNORMAL HIGH (ref 6–23)
CO2: 24 meq/L (ref 19–32)
Calcium: 9 mg/dL (ref 8.4–10.5)
Chloride: 100 meq/L (ref 96–112)
Creat: 1.39 mg/dL — ABNORMAL HIGH (ref 0.50–1.35)
Glucose, Bld: 87 mg/dL (ref 70–99)
Potassium: 4.8 meq/L (ref 3.5–5.3)
Sodium: 137 meq/L (ref 135–145)

## 2014-03-21 LAB — SAVE SMEAR

## 2014-03-21 NOTE — Patient Instructions (Signed)
CT scan abdomen pelvis at [facility] 24 hour urine for total protein and immunoelectropheresis MD visit 12/28  8:45 AM

## 2014-03-21 NOTE — Telephone Encounter (Signed)
S/W PT RE APPT [facility] 12/16 @ 8:15AM LB/INF PER 12/14 POF FROM [facility]. NO OTHER ORDERS.

## 2014-03-21 NOTE — Progress Notes (Signed)
Hematology and Oncology Follow Up Visit  Joel Macdonald [id number] Feb 26, 1931 80 y.o. 03/21/2014 4:03 PM   Principle Diagnosis: Encounter Diagnoses  Name Primary?  . MDS (myelodysplastic syndrome), low grade Yes  . Pancytopenia, acquired   . MGUS (monoclonal gammopathy of unknown significance)   . Edema, peripheral   . 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 Increasing anorexia. Problems with recurrent bilateral lower extremity cellulitis and now refractory, massive, bilateral, lower extremity edema. Additional changes on physical exam suggesting active vasculitis with intermittent ischemia of his digits, hands and feet. We are now seeing additional changes in his hematologic profile with sudden, progressive, anemia hemoglobin down from baseline of 10 g to today's value of 7 g. There has been a progressive rise in percent monocytes on the white count differential although the total white count and percent neutrophils have remained stable. There is an unusual rise in his platelet count on today's CBC up from the 100,000 baseline to current value of 171,000 confirmed by review of the peripheral blood film.  He has now had numerous studies to assess both venous and arterial flow of his lower extremities and results have been nondiagnostic. His internist obtained an echocardiogram which shows normal systolic function. No elevated pulmonary pressures. Patent IVC. No significant valvular abnormalities. He was started on a trial of high-dose diuretics up to a maximum dose of 100 mg of furosemide twice daily with minimal improvement in his lower extremity edema. He states his weight was down to 206 pounds but has risen up to a peak of 230 pounds and on diuretic therapy down to 219 pounds. Weight in our clinic today is 227 pounds.   He has not had any obvious proteinuria and most recent albumin recorded in our record only  borderline decreased at 3.3 g percent so I do not think that he has the nephrotic syndrome to explain his persistent lower extremity edema.  The follow-up venous Doppler study done last week on December 11 reported a 3.5 cm right inguinal lymph node enlargement.    Medications: reviewed  Allergies:  Allergies  Allergen Reactions  . Other Other (See Comments)    Ragweed causes nasal congestion    Review of Systems: See HPI  Remaining ROS negative:   Physical Exam: There were no vitals taken for this visit. Wt Readings from Last 3 Encounters:  03/21/14 226 lb 11.2 oz (102.83 kg)  11/15/13 221 lb 11.2 oz (100.562 kg)  08/10/13 216 lb 6.4 oz (98.158 kg)     General appearance: well nourished caucasian man HENNT: Pharynx no erythema, exudate, mass, or ulcer. No thyromegaly or thyroid nodules Lymph nodes: No cervical, supraclavicular, or axillary lymphadenopathy. Approximate 2 cm node palpable right inguinal area Breasts:  Lungs: Clear to auscultation, resonant to percussion throughout Heart: Regular rhythm, no murmur, no gallop, no rub, no click, no edema Abdomen: Soft, nontender, normal bowel sounds, no mass, no organomegaly Extremities: 3+ brawny  Edema to knees, no calf tenderness; diffuse erythema right leg Musculoskeletal: no joint deformities GU:  Vascular: Carotid pulses 2+, no bruits, distal pulses: Dorsalis pedis not palpable symmetric Neurologic: Alert, oriented, PERRLA, optic discs sharp and vessels normal, no hemorrhage or exudate, cranial nerves grossly normal, motor strength 5 over 5, reflexes 1+ symmetric, upper body coordination normal, gait normal, Skin: ecchymosis on forearms - chronic  Lab Results: CBC W/Diff    Component Value Date/Time  WBC 3.5* 03/21/2014 1015   WBC 3.0* 05/17/2013 1258   RBC 2.14* 03/21/2014 1015   RBC 3.01* 05/17/2013 1258   HGB 7.1* 03/21/2014 1015   HGB 10.4* 05/17/2013 1258   HCT 22.3* 03/21/2014 1015   HCT 31.5* 05/17/2013  1258   PLT 171 03/21/2014 1015   PLT 84* 05/17/2013 1258   MCV 104.2* 03/21/2014 1015   MCV 104.7* 05/17/2013 1258   MCH 33.2 03/21/2014 1015   MCH 34.6* 05/17/2013 1258   MCHC 31.8 03/21/2014 1015   MCHC 33.0 05/17/2013 1258   RDW 16.2* 03/21/2014 1015   RDW 16.0* 05/17/2013 1258   LYMPHSABS 0.9 03/21/2014 1015   LYMPHSABS 1.5 05/17/2013 1258   MONOABS 1.8* 03/21/2014 1015   MONOABS 0.8 05/17/2013 1258   EOSABS 0.0 03/21/2014 1015   EOSABS 0.0 05/17/2013 1258   BASOSABS 0.0 03/21/2014 1015   BASOSABS 0.0 05/17/2013 1258     Chemistry      Component Value Date/Time   NA 137 03/21/2014 1015   NA 139 11/09/2012 1425   K 4.8 03/21/2014 1015   K 4.2 11/09/2012 1425   CL 100 03/21/2014 1015   CO2 24 03/21/2014 1015   CO2 24 11/09/2012 1425   BUN 27* 03/21/2014 1015   BUN 22.4 11/09/2012 1425   CREATININE 1.39* 03/21/2014 1015   CREATININE 0.95 07/12/2013 2036   CREATININE 1.2 11/09/2012 1425      Component Value Date/Time   CALCIUM 9.0 03/21/2014 1015   CALCIUM 9.3 11/09/2012 1425   ALKPHOS 62 07/12/2013 0914   ALKPHOS 62 11/09/2012 1425   AST 19 07/12/2013 0914   AST 20 11/09/2012 1425   ALT 16 07/12/2013 0914   ALT 19 11/09/2012 1425   BILITOT 0.6 07/12/2013 0914   BILITOT 0.57 11/09/2012 1425     Review of peripheral blood film: Dimorphic population of red blood cells macrocytic and microcytic, neutrophils abnormal with nuclear cytoplasmic dissociation, hyper-granularity, abnormal nuclear folds in many. Increased monocytes. All atypical. Some appear to be very immature with multiple nucleoli. Platelets appear normal in number. Subpopulation of large platelets.  Impression:  #1. Pancytopenia and monocytosis secondary to myelodysplastic syndrome. I'm concerned with the fall in his hemoglobin and rise in the monocyte percentage with the appearance of a subpopulation of more immature monocytes,  that his blood disorder may be coming more aggressive with possible  transformation to acute leukemia. The only argument against this is the rise rather than fall in his platelet count. I discussed blood transfusion with him. His anemia is now symptomatic. He is in agreement with a blood transfusion and I will make arrangements to have this done this week as an outpatient at the [facility]. I will likely schedule him for a follow-up bone marrow biopsy soon.  #2. Persistent, massive, bilateral, lower extremity edema No obvious cardiac compromise. No obvious venous or arterial thrombosis. We have to consider that he may have an obstructing mass or bulky lymphadenopathy in the retroperitoneum or pelvis to explain his peripheral edema. I'm going to go ahead and get a CT scan of the abdomen and pelvis. Noncontrast in view of recent diuretic use and rise in his creatinine to 1.4 on today's analysis. The single right inguinal node noted on recent venous Doppler study may just be the tip of the iceberg. Although I do not think that he has the nephrotic syndrome, this needs to be excluded and I will obtain a 24 hour urine for total protein, immunoelectrophoresis, and  creatinine clearance. Further evaluation based on results above.  Above management discussed with his internist by phone   CC: Patient Care Team: Lajean Manes, MD as PCP - General (Internal Medicine) Annia Belt, MD as Consulting Physician (Oncology)   Annia Belt, MD 12/14/20154:03 PM

## 2014-03-22 LAB — PATHOLOGIST SMEAR REVIEW

## 2014-03-23 ENCOUNTER — Ambulatory Visit (HOSPITAL_COMMUNITY)
Admission: RE | Admit: 2014-03-23 | Discharge: 2014-03-23 | Disposition: A | Discharge status: Home / Self Care | Payer: Medicare Other | Source: Ambulatory Visit | Attending: Oncology | Admitting: Oncology

## 2014-03-23 ENCOUNTER — Ambulatory Visit (HOSPITAL_BASED_OUTPATIENT_CLINIC_OR_DEPARTMENT_OTHER): Payer: Medicare Other

## 2014-03-23 ENCOUNTER — Other Ambulatory Visit: Payer: Medicare Other

## 2014-03-23 VITALS — BP 162/86 | HR 85 | Temp 97.4°F | Resp 16

## 2014-03-23 DIAGNOSIS — D46Z Other myelodysplastic syndromes: Secondary | ICD-10-CM

## 2014-03-23 DIAGNOSIS — R609 Edema, unspecified: Secondary | ICD-10-CM

## 2014-03-23 DIAGNOSIS — D4620 Refractory anemia with excess of blasts, unspecified: Secondary | ICD-10-CM

## 2014-03-23 DIAGNOSIS — D649 Anemia, unspecified: Secondary | ICD-10-CM | POA: Insufficient documentation

## 2014-03-23 DIAGNOSIS — D472 Monoclonal gammopathy: Secondary | ICD-10-CM

## 2014-03-23 LAB — PREPARE RBC (CROSSMATCH)

## 2014-03-23 LAB — HOLD TUBE, BLOOD BANK

## 2014-03-23 LAB — ABO/RH: ABO/RH(D): O POS

## 2014-03-23 MED ORDER — SODIUM CHLORIDE 0.9 % IV SOLN
250.0000 mL | Freq: Once | INTRAVENOUS | Status: AC
  Administered 2014-03-23: 250 mL via INTRAVENOUS

## 2014-03-23 NOTE — Patient Instructions (Signed)

## 2014-03-24 LAB — TYPE AND SCREEN
ABO/RH(D): O POS
Antibody Screen: NEGATIVE
Unit division: 0
Unit division: 0

## 2014-03-24 LAB — CREATININE CLEARANCE, URINE, 24 HOUR
CREATININE: 1.39 mg/dL — AB (ref 0.50–1.35)
Creatinine Clearance: 81 mL/min (ref 75–125)
Creatinine, 24H Ur: 1621 mg/d (ref 800–2000)
Creatinine, Urine: 81.1 mg/dL

## 2014-03-25 ENCOUNTER — Ambulatory Visit (HOSPITAL_COMMUNITY)
Admission: RE | Admit: 2014-03-25 | Discharge: 2014-03-25 | Disposition: A | Discharge status: Home / Self Care | Payer: Medicare Other | Source: Ambulatory Visit | Attending: Oncology | Admitting: Oncology

## 2014-03-25 DIAGNOSIS — D469 Myelodysplastic syndrome, unspecified: Secondary | ICD-10-CM | POA: Insufficient documentation

## 2014-03-25 DIAGNOSIS — R609 Edema, unspecified: Secondary | ICD-10-CM

## 2014-03-25 DIAGNOSIS — D4620 Refractory anemia with excess of blasts, unspecified: Secondary | ICD-10-CM

## 2014-03-25 DIAGNOSIS — L03116 Cellulitis of left lower limb: Secondary | ICD-10-CM

## 2014-03-25 NOTE — Progress Notes (Signed)
Patient ID: Joel Macdonald, male   DOB: May 20, 1930, 80 y.o.   MRN: [id number] Hematology and Oncology Follow Up Visit  Joel Macdonald [id number] [birth year] 80 y.o. 03/25/2014 8:17 AM   Principle Diagnosis: Encounter Diagnoses  Name Primary?  . Cellulitis of leg, left   . MDS (myelodysplastic syndrome), low grade   . Edema, peripheral   . MGUS (monoclonal gammopathy of unknown significance) Yes   This is a repeat dictation already done on the day of the patient's visit 03/21/2014 but deleted from the system for unclear reasons.  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 Bone marrow biopsy done 06/13/2011 showed changes of myelodysplasia with dyserythropoiesis and dysmegakaryopoiesis. There were some focal  plasmacytoid infiltrates. He did have a borderline elevation of his serum IgM. There were no excess blasts. Cytogenetic studies were normal. Blood counts have been overall stable over time with some minor fluctuations. Overall he was doing well until one year ago. He had a flulike illness in December 2014 with a persistent cough not responding to antibacterial antibiotics and the presence of oral ulcerations typical of a virus infection. He subsequently began to develop first right leg then left leg cellulitis and swelling. Over the next few months, he had a number of venous and arterial Doppler studies which failed to show any evidence for thrombosis. He was treated with multiple courses of antibiotics with minimal response and persistent, and progressive, brawny edema of his lower extremities. MRI of his right leg did not show any evidence for osteomyelitis. A routine urinalysis showed no protein and serum albumin initially normal with most recent value slightly decreased at 3.3 g. His performance status has  declined over the same interval with increasing malaise, and anorexia.  His internist began a trial of high-dose diuretics about 2 weeks ago. Although initially he lost some weight falling from 222 pounds in April down to 206 pounds on his scales, our office scale today shows no improvement in fact weight is higher at 227 pounds. An echocardiogram was ordered to assess possibility of clot in his IVC. This showed a normal left ventricular ejection fraction. No significant valvular abnormalities. Normal flow in the IVC with no evidence of clot.  Incidentally noted on a follow-up venous Doppler study done on December 11 was right inguinal adenopathy.  I have been concerned that he developed some form of vasculitis when he developed ischemic changes of the digits of his toes bilaterally at time of my April 2015 exam. ESR elevated at 49 mm. Cryoglobulins not detected. ANCA screen negative.  Medications: reviewed  Allergies:  Allergies  Allergen Reactions  . Other Other (See Comments)    Ragweed causes nasal congestion    Review of Systems: See history of present illness  Remaining ROS negative:   Physical Exam: Blood pressure 133/70, pulse 79, temperature 97.6 F (36.4 C), temperature source Oral, height 6' 1"  (1.854 m), weight 226 lb 11.2 oz (102.83 kg), SpO2 100 %. Wt Readings from Last 3 Encounters:  03/21/14 226 lb 11.2 oz (102.83 kg)  11/15/13 221 lb 11.2 oz (100.562 kg)  08/10/13 216 lb 6.4 oz (98.158 kg)     General appearance: Caucasian man. He has lost muscle mass from his face. HENNT: Pharynx no erythema, exudate, mass, or ulcer. No thyromegaly or thyroid nodules Lymph nodes: No cervical, supraclavicular, or axillary lymphadenopathy. A  single, approximate, 2 cm lymph node palpable along the right inguinal ligament. Breasts:  Lungs: Clear to auscultation, resonant to percussion throughout Heart: Regular rhythm, no murmur, no gallop, no rub, no click, no edema Abdomen: Soft,  nontender, normal bowel sounds, no mass, no organomegaly Extremities: Persistent, 3+, brawny edema, both legs, diffuse erythema right leg. no calf tenderness. Ischemic changes of some of his toes on both feet less pronounced than on prior exam. Musculoskeletal: no joint deformities GU:  Vascular: Carotid pulses 2+, no bruits, distal pulses: Dorsalis pedis unable to palpate due to pedal edema Neurologic: Alert, oriented, PERRLA, optic discs sharp and vessels normal, no hemorrhage or exudate, cranial nerves grossly normal, motor strength 5 over 5, reflexes 1+ symmetric, upper body coordination normal, gait normal, Skin: No rash or ecchymosis  Lab Results: CBC W/Diff  White count differential: 22% neutrophils, 27% lymphocytes, 51% monocytes   Component Value Date/Time   WBC 3.5* 03/21/2014 1015   WBC 3.0* 05/17/2013 1258   RBC 2.14* 03/21/2014 1015   RBC 3.01* 05/17/2013 1258   HGB 7.1* 03/21/2014 1015   HGB 10.4* 05/17/2013 1258   HCT 22.3* 03/21/2014 1015   HCT 31.5* 05/17/2013 1258   PLT 171 03/21/2014 1015   PLT 84* 05/17/2013 1258   MCV 104.2* 03/21/2014 1015   MCV 104.7* 05/17/2013 1258   MCH 33.2 03/21/2014 1015   MCH 34.6* 05/17/2013 1258   MCHC 31.8 03/21/2014 1015   MCHC 33.0 05/17/2013 1258   RDW 16.2* 03/21/2014 1015   RDW 16.0* 05/17/2013 1258   LYMPHSABS 0.9 03/21/2014 1015   LYMPHSABS 1.5 05/17/2013 1258   MONOABS 1.8* 03/21/2014 1015   MONOABS 0.8 05/17/2013 1258   EOSABS 0.0 03/21/2014 1015   EOSABS 0.0 05/17/2013 1258   BASOSABS 0.0 03/21/2014 1015   BASOSABS 0.0 05/17/2013 1258     Chemistry      Component Value Date/Time   NA 137 03/21/2014 1015   NA 139 11/09/2012 1425   K 4.8 03/21/2014 1015   K 4.2 11/09/2012 1425   CL 100 03/21/2014 1015   CO2 24 03/21/2014 1015   CO2 24 11/09/2012 1425   BUN 27* 03/21/2014 1015   BUN 22.4 11/09/2012 1425   CREATININE 1.39* 03/23/2014 0730   CREATININE 1.39* 03/21/2014 1015   CREATININE 0.95 07/12/2013 2036    CREATININE 1.2 11/09/2012 1425      Component Value Date/Time   CALCIUM 9.0 03/21/2014 1015   CALCIUM 9.3 11/09/2012 1425   ALKPHOS 62 07/12/2013 0914   ALKPHOS 62 11/09/2012 1425   AST 19 07/12/2013 0914   AST 20 11/09/2012 1425   ALT 16 07/12/2013 0914   ALT 19 11/09/2012 1425   BILITOT 0.6 07/12/2013 0914   BILITOT 0.57 11/09/2012 1425     Review of the peripheral blood film: Normochromic normocytic red cells. No polychromasia. Dysplastic changes in the neutrophils with hyper granulation. Persistent monocytosis. Subpopulation of immature monocytes with multiple nucleoli and azurophilic cytoplasm   Radiological Studies: No results found.  Impression:  Complex multifactorial problems: #1. Myelodysplastic syndrome initially low-grade now with evidence for deterioration with a 3 g fall in his hemoglobin, rise in his monocyte percentage and presence of immature monocytes which may be blasts. Once we have a better idea of what is causing his severe lower extremity edema, I will need to repeat a bone marrow biopsy to exclude leukemic conversion. I discussed transfusion therapy with him. He has agreed and we will proceed with a 2 unit packed red  cell blood transfusion this week.  #2. Refractory, bilateral, peripheral edema. At this point we have excluded cardiac and thrombotic etiologies. My concern is that he has an obstructing lymph node mass in his pelvis to explain his persistent bilateral edema. He has a prior history of treated prostate cancer. Possibility of lymphoma also considered with recent venous Doppler study incidentally showing right inguinal adenopathy which is confirmed on my exam today. I'm going to order a CT scan without contrast of the abdomen and pelvis. To exclude the possibility of the nephrotic syndrome as etiology of his edema, I will also obtain a 24-hour urine for total protein and creatinine clearance. Immunofixation electrophoresis.  #3. Ischemic changes of  his digits both feet Less pronounced on today's exam. Vasculitis screen negative so far. Hard to interpret his elevated ESR in the face of chronic inflammation and swelling of his lower extremities. To date he has had normal renal and hepatic function. Vasculitis usually causes systemic organ dysfunction. I will continue to monitor these changes closely.  Above impressions discussed with his internist.   CC: Patient Care Team: Lajean Manes, MD as PCP - General (Internal Medicine) Annia Belt, MD as Consulting Physician (Oncology)   Annia Belt, MD 12/18/20158:17 AM

## 2014-03-29 LAB — UIFE/LIGHT CHAINS/TP QN, 24-HR UR
ALBUMIN, U: DETECTED
Alpha 1, Urine: DETECTED — AB
Alpha 2, Urine: DETECTED — AB
BETA UR: DETECTED — AB
GAMMA UR: DETECTED — AB
TOTAL PROTEIN, URINE-UPE24: 50 mg/dL — AB (ref 5–25)
Total Protein, Urine-Ur/day: 1000 mg/d — ABNORMAL HIGH (ref <150)

## 2014-04-18 ENCOUNTER — Ambulatory Visit (INDEPENDENT_AMBULATORY_CARE_PROVIDER_SITE_OTHER): Payer: Medicare Other | Admitting: Podiatry

## 2014-04-18 ENCOUNTER — Encounter: Payer: Self-pay | Admitting: Podiatry

## 2014-04-18 ENCOUNTER — Ambulatory Visit (INDEPENDENT_AMBULATORY_CARE_PROVIDER_SITE_OTHER): Payer: Medicare Other

## 2014-04-18 VITALS — BP 152/83 | HR 84 | Resp 16

## 2014-04-18 DIAGNOSIS — M79673 Pain in unspecified foot: Secondary | ICD-10-CM

## 2014-04-18 DIAGNOSIS — R609 Edema, unspecified: Secondary | ICD-10-CM

## 2014-04-18 NOTE — Progress Notes (Signed)
Subjective:     Patient ID: Joel Macdonald, male   DOB: 11/26/1930, 81 y.o.   MRN: [id number]  HPI patient presents with swelling in his right lower leg it's been persistent for about a year and some achiness in his foot. States that he has seen numerous physicians for this and has had numerous diagnostic studies which have failed to -reveal any form of either vein disease or lymphatic disease. He does have chronic anemia that he is being treated by hematologist   Review of Systems  All other systems reviewed and are negative.      Objective:   Physical Exam  Constitutional: He is oriented to person, place, and time.  Musculoskeletal: Normal range of motion.  Neurological: He is oriented to person, place, and time.  Skin: Skin is dry.  Nursing note and vitals reviewed.  vascular status was significantly diminished with DP and PT pulses but he has had this checked by the vascular doctors cross the street from [place]. Patient does have mild neurological disease diminished range of motion of the subtalar midtarsal joint and diminished muscle strength. Patient has redness in his right lower leg with no drainage noted and it does extend to the knee and it is quite tight when palpated. History of this and the left leg which is moderately present but resolved to a degree and there is also some slight irritation in the right lower leg which is due to the irritation of the skin from the swelling     Assessment:     Appears to be more of a lymphatic issue than anything else due to the hardness of the swelling and the fact that it does not use overnight    Plan:     H&P and x-rays reviewed and condition discussed. At this time I applied an Haematologist with Ace wrap to try to reduce some of the swelling and if this is successful we could continue this for a period of time. He has been treated by numerous physicians for this so I do not expect any miracle cure worse but today Unna boot and Ace wrap were  applied and I will have him see Dr. Jacqualyn Posey next week to reapply [employer] and have him evaluate if he has any other ideas on treatment. He will see me in approximately 4 weeks after having the [employer]

## 2014-04-18 NOTE — Progress Notes (Signed)
   Subjective:    Patient ID: KORT STETTLER, male    DOB: Aug 28, 1930, 81 y.o.   MRN: [id number]  HPI Comments: "I have a swollen foot"  Patient c/o aching right foot for several months. The whole foot is swollen and red. He has been on antibiotics, oral and IV. PCP dx cellulitis. He has been treated with diuretics. His leg stays extremely sore, especially with walking. "No one has been able to help me"  He can't cut the toenails himself.  Foot Pain Associated symptoms include arthralgias and myalgias.      Review of Systems  Cardiovascular: Positive for leg swelling.  Musculoskeletal: Positive for myalgias, arthralgias and gait problem.  Hematological: Bruises/bleeds easily.  All other systems reviewed and are negative.      Objective:   Physical Exam        Assessment & Plan:

## 2014-04-19 NOTE — Progress Notes (Signed)
This encounter was created in error - please disregard.

## 2014-04-20 ENCOUNTER — Other Ambulatory Visit: Payer: Self-pay | Admitting: Oncology

## 2014-04-20 ENCOUNTER — Ambulatory Visit (HOSPITAL_COMMUNITY)
Admission: RE | Admit: 2014-04-20 | Discharge: 2014-04-20 | Disposition: A | Discharge status: Home / Self Care | Payer: Medicare Other | Source: Ambulatory Visit | Attending: Oncology | Admitting: Oncology

## 2014-04-20 DIAGNOSIS — D469 Myelodysplastic syndrome, unspecified: Secondary | ICD-10-CM

## 2014-04-20 LAB — ABO/RH: ABO/RH(D): O POS

## 2014-04-20 LAB — PREPARE RBC (CROSSMATCH)

## 2014-04-21 ENCOUNTER — Encounter (HOSPITAL_COMMUNITY)
Admission: RE | Admit: 2014-04-21 | Discharge: 2014-04-21 | Disposition: A | Discharge status: Home / Self Care | Payer: Medicare Other | Source: Ambulatory Visit | Attending: Oncology | Admitting: Oncology

## 2014-04-21 VITALS — BP 157/78 | HR 87 | Temp 98.0°F | Resp 20 | Ht 74.0 in | Wt 119.0 lb

## 2014-04-21 DIAGNOSIS — D469 Myelodysplastic syndrome, unspecified: Secondary | ICD-10-CM

## 2014-04-21 MED ORDER — FUROSEMIDE 10 MG/ML IJ SOLN
20.0000 mg | Freq: Once | INTRAMUSCULAR | Status: AC
  Administered 2014-04-21: 20 mg via INTRAVENOUS

## 2014-04-21 MED ORDER — SODIUM CHLORIDE 0.9 % IV SOLN: Freq: Once | INTRAVENOUS | Status: DC

## 2014-04-21 MED ORDER — FUROSEMIDE 10 MG/ML IJ SOLN
INTRAMUSCULAR | Status: AC
  Filled 2014-04-21: qty 2

## 2014-04-22 LAB — TYPE AND SCREEN
ABO/RH(D): O POS
Antibody Screen: NEGATIVE
UNIT DIVISION: 0
Unit division: 0

## 2014-04-27 ENCOUNTER — Encounter: Payer: Self-pay | Admitting: Podiatry

## 2014-04-27 ENCOUNTER — Ambulatory Visit (INDEPENDENT_AMBULATORY_CARE_PROVIDER_SITE_OTHER): Payer: Medicare Other | Admitting: Podiatry

## 2014-04-27 VITALS — BP 145/81 | HR 61 | Resp 18

## 2014-04-27 DIAGNOSIS — B351 Tinea unguium: Secondary | ICD-10-CM

## 2014-04-27 DIAGNOSIS — L84 Corns and callosities: Secondary | ICD-10-CM

## 2014-04-27 DIAGNOSIS — I739 Peripheral vascular disease, unspecified: Secondary | ICD-10-CM

## 2014-04-27 NOTE — Progress Notes (Signed)
   Subjective:    Patient ID: Joel Macdonald, male    DOB: 04/05/1931, 81 y.o.   MRN: [id number]  HPI  81 year old male presents the office today for elongated toenails as well as calluses to both of his feet. He states that last appointment and Unna boot was applied to his right leg for lymphedema. He states that earlier has to other physicians treating his lymphedema and he does not want another Unna boot or any further treatment for his legs. He would only like for [place] to look at his feet. He does state that he has recently been placed back on doxycycline for possible infection to his right leg. He does state he does have new openings to his right leg however. He states that his nails are elongated, and rub particularly with shoe gear. Denies any recent redness or drainage from the sites. He states that he has calluses bilateral feet for possible wart on the right foot. No other complaints at this time    Review of Systems  All other systems reviewed and are negative.      Objective:   Physical Exam AAO 3, NAD DP/PT pulses decreased bilaterally, CRT less than 3 seconds Decreased protective sensation with Simms Weinstein monofilament, Achilles tendon reflex intact. Nails are hypertrophic, dystrophic, elongated, brittle, discolored 10. No swelling erythema or drainage from the nail sites. Hyperkeratotic lesions bilateral submetatarsal 5 on the right submetatarsal. Upon debridement was lesions there is no pinpoint bleeding and there is no evidence of verruca and this time. No other open lesions or pre-ulcerative lesions are identified. No interdigital maceration is present. There is edema to the right lower extremity compared to the left and chronic lymphedema and are slightly warmth of the right leg compared to the left. There is small superficial openings with serous drainage on the anterior aspect of the right ankle and along the calf. There is no pain with calf compression.         Assessment & Plan:  81 year old male with symptomatic onychomycosis, hyperkeratotic lesions; chronic lymphedema/possible infection -Treatment options were discussed with the patient including alternatives, risks, complications. -At this time I'll defer treatment for the lymphedema to his other physicians who are treating this and he actually states that he does not want [place] treating his leg. Discussed with him to apply small amount of antibiotic ointment and a bandage overlying the superficial openings on the right leg. -Nail sharply debrided 10 without complication/bleeding. -Hyperkeratotic lesion sharply debrided 3 without complication/bleeding. -Discussed the importance of daily foot inspection. -Follow-up in 3 months or sooner should any problems arise. In the meantime, encouraged to call the office with any questions, concerns, change in symptoms.

## 2014-04-27 NOTE — Patient Instructions (Signed)
Monitor for any signs/symptoms of infection. Call the office immediately if any occur or go directly to the emergency room. Call with any questions/concerns.  

## 2014-05-02 ENCOUNTER — Other Ambulatory Visit: Payer: Self-pay | Admitting: Oncology

## 2014-05-02 DIAGNOSIS — D4620 Refractory anemia with excess of blasts, unspecified: Secondary | ICD-10-CM

## 2014-05-02 DIAGNOSIS — D61818 Other pancytopenia: Secondary | ICD-10-CM

## 2014-05-02 DIAGNOSIS — D472 Monoclonal gammopathy: Secondary | ICD-10-CM

## 2014-05-03 ENCOUNTER — Ambulatory Visit (INDEPENDENT_AMBULATORY_CARE_PROVIDER_SITE_OTHER): Payer: Medicare Other | Admitting: Oncology

## 2014-05-03 VITALS — BP 137/64 | HR 85 | Temp 98.3°F | Ht 73.0 in | Wt 218.9 lb

## 2014-05-03 DIAGNOSIS — D46Z Other myelodysplastic syndromes: Secondary | ICD-10-CM

## 2014-05-03 DIAGNOSIS — D472 Monoclonal gammopathy: Secondary | ICD-10-CM

## 2014-05-03 DIAGNOSIS — D61818 Other pancytopenia: Secondary | ICD-10-CM

## 2014-05-03 DIAGNOSIS — D619 Aplastic anemia, unspecified: Secondary | ICD-10-CM

## 2014-05-03 DIAGNOSIS — D4620 Refractory anemia with excess of blasts, unspecified: Secondary | ICD-10-CM

## 2014-05-03 LAB — COMPREHENSIVE METABOLIC PANEL
ALBUMIN: 3.5 g/dL (ref 3.5–5.2)
ALT: 9 U/L (ref 0–53)
AST: 17 U/L (ref 0–37)
Alkaline Phosphatase: 70 U/L (ref 39–117)
BUN: 22 mg/dL (ref 6–23)
CALCIUM: 9.1 mg/dL (ref 8.4–10.5)
CO2: 26 meq/L (ref 19–32)
Chloride: 103 mEq/L (ref 96–112)
Creat: 0.84 mg/dL (ref 0.50–1.35)
GLUCOSE: 87 mg/dL (ref 70–99)
POTASSIUM: 4.5 meq/L (ref 3.5–5.3)
SODIUM: 139 meq/L (ref 135–145)
Total Bilirubin: 0.6 mg/dL (ref 0.2–1.2)
Total Protein: 7.1 g/dL (ref 6.0–8.3)

## 2014-05-03 LAB — LACTATE DEHYDROGENASE: LDH: 197 U/L (ref 94–250)

## 2014-05-03 LAB — SAVE SMEAR

## 2014-05-03 LAB — URIC ACID: URIC ACID, SERUM: 4.7 mg/dL (ref 4.0–7.8)

## 2014-05-03 NOTE — Patient Instructions (Signed)
To lab today  Bone marrow biopsy at [facility]  Thursday 1/28 procedure at 8:30 AM.  Please come 20 minutes early to get prepared. Return visit with Dr Darnell Level in 2 weeks to review results

## 2014-05-04 LAB — CBC WITH DIFFERENTIAL/PLATELET
BASOS ABS: 0 10*3/uL (ref 0.0–0.1)
BASOS PCT: 0 % (ref 0–1)
EOS PCT: 0 % (ref 0–5)
Eosinophils Absolute: 0 10*3/uL (ref 0.0–0.7)
HCT: 23.1 % — ABNORMAL LOW (ref 39.0–52.0)
Hemoglobin: 7.5 g/dL — ABNORMAL LOW (ref 13.0–17.0)
Lymphocytes Relative: 24 % (ref 12–46)
Lymphs Abs: 0.9 10*3/uL (ref 0.7–4.0)
MCH: 31.1 pg (ref 26.0–34.0)
MCHC: 32.5 g/dL (ref 30.0–36.0)
MCV: 95.9 fL (ref 78.0–100.0)
MONOS PCT: 52 % — AB (ref 3–12)
MPV: 11.1 fL (ref 8.6–12.4)
Monocytes Absolute: 2 10*3/uL — ABNORMAL HIGH (ref 0.1–1.0)
NEUTROS ABS: 0.9 10*3/uL — AB (ref 1.7–7.7)
Neutrophils Relative %: 24 % — ABNORMAL LOW (ref 43–77)
Platelets: 182 10*3/uL (ref 150–400)
RBC: 2.41 MIL/uL — ABNORMAL LOW (ref 4.22–5.81)
RDW: 17.3 % — AB (ref 11.5–15.5)
WBC: 3.9 10*3/uL — AB (ref 4.0–10.5)

## 2014-05-04 LAB — IGG, IGA, IGM
IGM, SERUM: 330 mg/dL — AB (ref 41–251)
IgA: 397 mg/dL — ABNORMAL HIGH (ref 68–379)
IgG (Immunoglobin G), Serum: 1280 mg/dL (ref 650–1600)

## 2014-05-04 NOTE — Progress Notes (Signed)
Patient ID: Joel Macdonald, male   DOB: 10/25/1930, 81 y.o.   MRN: [id number] Hematology and Oncology Follow Up Visit  Joel Macdonald [id number] [birth year] 81 y.o. 05/04/2014 9:15 AM   Principle Diagnosis: Encounter Diagnoses  Name Primary?  . MDS (myelodysplastic syndrome), low grade Yes  . Pancytopenia, acquired   . 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 Bone marrow biopsy done 06/13/2011 showed changes of myelodysplasia with dyserythropoiesis and dysmegakaryopoiesis. There were some focal plasmacytoid infiltrates. He did have a borderline elevation of his serum IgM. There were no excess blasts. Cytogenetic studies were normal. Blood counts have been overall stable over time with some minor fluctuations. Overall he was doing well until one year ago. He had a flulike illness in December 2014 with a persistent cough not responding to antibacterial antibiotics and the presence of oral ulcerations typical of a virus infection. He subsequently began to develop first right leg then left leg cellulitis and swelling. Over the next few months, he had a number of venous and arterial Doppler studies which failed to show any evidence for thrombosis. He was treated with multiple courses of antibiotics with minimal response and persistent, and progressive, brawny edema of his lower extremities. MRI of his right leg did not show any evidence for osteomyelitis. A routine urinalysis showed no protein and serum albumin initially normal with most recent value slightly decreased at 3.3 g. His performance status has declined over the same interval with increasing malaise, and anorexia.  His internist began a trial of high-dose diuretics . Iinitially he lost some weight. An echocardiogram was ordered to  assess possibility of clot in his IVC. This showed a normal left ventricular ejection fraction. No significant valvular abnormalities. Normal flow in the IVC with no evidence of clot.  Incidentally noted on a follow-up venous Doppler study done on December 11 was right inguinal adenopathy. This was followed up with a CT scan of the abdomen and pelvis done on 03/25/2014 which showed borderline bilateral iliac chain adenopathy with largest node on the right side measuring 1.8 cm largest retroperitoneal node 0.9 cm. Spleen was not enlarged but did have some calcified granulomas. I did not feel that this degree of adenopathy was sufficient to explain his significant bilateral lower extremity edema. I obtained a 24 hour urine collection which did show nephrotic range proteinuria of 1 g. Serum albumin borderline decrease of 3.5 g. Renal function was normal until he began high-dose diuretics. Diuretics have been tapered. Renal function today back to normal with BUN 24 and creatinine 0.8.  I have been concerned that he developed some form of vasculitis when he developed ischemic changes of the digits of his toes bilaterally at time of my April 2015 exam. ESR elevated at 49 mm. Cryoglobulins not detected. ANCA screen negative.   Interim History:   He continues to do poorly overall. Appetite is good but when he starts to eat he can only eat a few bites falls and then has to stop. He has lost muscle mass from his face. He still has significant edema of his lower extremities. He has developed progressive anemia and I arranged for a blood transfusion last week when hemoglobin fell to 7.1 g. He had minimal response with hemoglobin today 7.5 g. He reminded me today that about 5 years ago he had a radical  prostatectomy with a pelvic lymphadenectomy. He questions whether or not this could be related to his lymphedema? I think this is a possibility although I have not seen this in my other prostate cancer patients treated  in a similar fashion. He has an IgM monoclonal gammopathy of undetermined significance. I repeated IgM studies today and they are stable and at his baseline with only mild elevation at 330 mg percent. There is no concomitant suppression of his IgG or IgA levels.  He reports that his internist has started him on low-dose thyroid replacement.    Medications: reviewed  Allergies:  Allergies  Allergen Reactions  . Other Other (See Comments)    Ragweed causes nasal congestion    Review of Systems: See history of present illness  Remaining ROS negative:   Physical Exam: Blood pressure 137/64, pulse 85, temperature 98.3 F (36.8 C), temperature source Oral, height 6' 1"  (1.854 m), weight 218 lb 14.4 oz (99.292 kg), SpO2 100 %. Wt Readings from Last 3 Encounters:  05/03/14 218 lb 14.4 oz (99.292 kg)  04/21/14 119 lb (53.978 kg)  03/21/14 226 lb 11.2 oz (102.83 kg)     General appearance: Now chronically ill-appearing Caucasian man HENNT: Pharynx no erythema, exudate, mass, or ulcer. No thyromegaly or thyroid nodules Lymph nodes: No cervical, supraclavicular, or axillary lymphadenopathy. There is now increasing bilateral inguinal lymphadenopathy with multiple small nodes easily palpable compare with prior exams. Breasts:  Lungs: Clear to auscultation, resonant to percussion throughout Heart: Regular rhythm, no murmur, no gallop, no rub, no click, no edema Abdomen: Soft, nontender, normal bowel sounds, no mass, no organomegaly Extremities: 3+ brawny edema, no calf tenderness Musculoskeletal: no joint deformities GU:  Vascular: Carotid pulses 2+, no bruits,  Neurologic: Alert, oriented, PERRLA, cranial nerves grossly normal, motor strength 5 over 5, reflexes 1+ symmetric, upper body coordination normal, gait normal, Skin: No rash or ecchymosis. There is now extensive desquamation of dried skin over both his lower extremities.  Lab Results: CBC W/Diff    Component Value Date/Time    WBC 3.9* 05/03/2014 1608   WBC 3.0* 05/17/2013 1258   RBC 2.41* 05/03/2014 1608   RBC 3.01* 05/17/2013 1258   HGB 7.5* 05/03/2014 1608   HGB 10.4* 05/17/2013 1258   HCT 23.1* 05/03/2014 1608   HCT 31.5* 05/17/2013 1258   PLT 182 05/03/2014 1608   PLT 84* 05/17/2013 1258   MCV 95.9 05/03/2014 1608   MCV 104.7* 05/17/2013 1258   MCH 31.1 05/03/2014 1608   MCH 34.6* 05/17/2013 1258   MCHC 32.5 05/03/2014 1608   MCHC 33.0 05/17/2013 1258   RDW 17.3* 05/03/2014 1608   RDW 16.0* 05/17/2013 1258   LYMPHSABS 0.9 05/03/2014 1608   LYMPHSABS 1.5 05/17/2013 1258   MONOABS 2.0* 05/03/2014 1608   MONOABS 0.8 05/17/2013 1258   EOSABS 0.0 05/03/2014 1608   EOSABS 0.0 05/17/2013 1258   BASOSABS 0.0 05/03/2014 1608   BASOSABS 0.0 05/17/2013 1258     Chemistry      Component Value Date/Time   NA 139 05/03/2014 1608   NA 139 11/09/2012 1425   K 4.5 05/03/2014 1608   K 4.2 11/09/2012 1425   CL 103 05/03/2014 1608   CO2 26 05/03/2014 1608   CO2 24 11/09/2012 1425   BUN 22 05/03/2014 1608   BUN 22.4 11/09/2012 1425   CREATININE 0.84 05/03/2014 1608   CREATININE 1.39* 03/23/2014 0730   CREATININE 0.95 07/12/2013 2036   CREATININE 1.2 11/09/2012 1425  Component Value Date/Time   CALCIUM 9.1 05/03/2014 1608   CALCIUM 9.3 11/09/2012 1425   ALKPHOS 70 05/03/2014 1608   ALKPHOS 62 11/09/2012 1425   AST 17 05/03/2014 1608   AST 20 11/09/2012 1425   ALT 9 05/03/2014 1608   ALT 19 11/09/2012 1425   BILITOT 0.6 05/03/2014 1608   BILITOT 0.57 11/09/2012 1425       Radiological Studies: Dg Foot 2 Views Left  04/21/2014   Multiple view left indicate significant medial deviation of the second toe  with no indications of advanced stress fracture or arthritis  Dg Foot 2 Views Right  04/21/2014   Multiple views right indicate deviation of the second toe with no  indications of advanced arthritis stress fracture and moderate forefoot  instability   Impression:  #1. Myelodysplastic  syndrome Although there has been no change in his white count or platelet count, he has developed progressive normochromic anemia. He has also developed a systemic illness which I have not been able to characterize. This may be the reason for the progressive anemia. However, I'm going to proceed with a follow-up bone marrow aspiration and biopsy to see if his MDS has progressed and whether he would be a suitable candidate for an erythropoietin stimulating agent. I'm checking a baseline erythropoietin level today.  #2. Idiopathic lymphedema, proteinuria, progressive anemia, low-grade nonbulky lymphadenopathy without splenomegaly. I will wait and see what the bone marrow shows. We may need to get a inguinal lymph node biopsy.  #3. IgM monoclonal gammopathy of undetermined significance-no change over time. IgM level at baseline  #4. History of prostate cancer treated with primary surgery I cannot find a recent PSA. It would be reasonable to check a PSA at this time.   CC: Patient Care Team: Lajean Manes, MD as PCP - General (Internal Medicine) Annia Belt, MD as Consulting Physician (Oncology)   Annia Belt, MD 1/27/20169:15 AM

## 2014-05-05 ENCOUNTER — Other Ambulatory Visit (HOSPITAL_COMMUNITY)
Admission: RE | Admit: 2014-05-05 | Discharge: 2014-05-05 | Disposition: A | Discharge status: Home / Self Care | Payer: Medicare Other | Source: Ambulatory Visit | Attending: Oncology | Admitting: Oncology

## 2014-05-05 ENCOUNTER — Ambulatory Visit (INDEPENDENT_AMBULATORY_CARE_PROVIDER_SITE_OTHER): Payer: Medicare Other | Admitting: Oncology

## 2014-05-05 ENCOUNTER — Encounter: Payer: Self-pay | Admitting: Oncology

## 2014-05-05 VITALS — BP 166/88 | HR 86 | Temp 97.4°F | Resp 20 | Ht 71.0 in | Wt 221.4 lb

## 2014-05-05 DIAGNOSIS — D649 Anemia, unspecified: Secondary | ICD-10-CM | POA: Insufficient documentation

## 2014-05-05 DIAGNOSIS — D4620 Refractory anemia with excess of blasts, unspecified: Secondary | ICD-10-CM

## 2014-05-05 DIAGNOSIS — D46Z Other myelodysplastic syndromes: Secondary | ICD-10-CM | POA: Diagnosis present

## 2014-05-05 DIAGNOSIS — C61 Malignant neoplasm of prostate: Secondary | ICD-10-CM

## 2014-05-05 LAB — CBC WITH DIFFERENTIAL/PLATELET
Basophils Absolute: 0 10*3/uL (ref 0.0–0.1)
Basophils Relative: 0 % (ref 0–1)
Eosinophils Absolute: 0 10*3/uL (ref 0.0–0.7)
Eosinophils Relative: 0 % (ref 0–5)
HCT: 24.3 % — ABNORMAL LOW (ref 39.0–52.0)
Hemoglobin: 7.5 g/dL — ABNORMAL LOW (ref 13.0–17.0)
LYMPHS PCT: 18 % (ref 12–46)
Lymphs Abs: 0.6 10*3/uL — ABNORMAL LOW (ref 0.7–4.0)
MCH: 30.9 pg (ref 26.0–34.0)
MCHC: 30.9 g/dL (ref 30.0–36.0)
MCV: 100 fL (ref 78.0–100.0)
MONO ABS: 2 10*3/uL — AB (ref 0.1–1.0)
Monocytes Relative: 55 % — ABNORMAL HIGH (ref 3–12)
NEUTROS PCT: 27 % — AB (ref 43–77)
Neutro Abs: 1 10*3/uL — ABNORMAL LOW (ref 1.7–7.7)
PLATELETS: 161 10*3/uL (ref 150–400)
RBC: 2.43 MIL/uL — ABNORMAL LOW (ref 4.22–5.81)
RDW: 18.2 % — ABNORMAL HIGH (ref 11.5–15.5)
WBC: 3.6 10*3/uL — ABNORMAL LOW (ref 4.0–10.5)

## 2014-05-05 LAB — BONE MARROW EXAM

## 2014-05-05 LAB — ERYTHROPOIETIN: Erythropoietin: 133.6 m[IU]/mL — ABNORMAL HIGH (ref 2.6–18.5)

## 2014-05-05 NOTE — Progress Notes (Signed)
Patient ID: Joel Macdonald, male   DOB: 1930-09-11, 81 y.o.   MRN: [id number] Bone Marrow Biopsy and Aspiration Procedure Note   Informed consent was obtained and potential risks including bleeding, infection and pain were reviewed with the patient.  Red Rule procedure followed  Posterior iliac crest(s) prepped with Betadine.  Lidocaine 2% local anesthesia infiltrated into the subcutaneous tissue.  Premedication: none  Left bone marrow biopsy and  aspirate was obtained.   The procedure was tolerated well and there were no complications.  Specimens sent for routine histopathologic stains and sectioning, flow cytometry and cytogenetics  Indication: Known MDS; now progressive anemia, monocytosis R/O leukemic conversion  Physician: Annia Belt

## 2014-05-06 LAB — PSA: PSA: <0.01 ng/mL (ref <=4.00)

## 2014-05-11 ENCOUNTER — Other Ambulatory Visit: Payer: Self-pay | Admitting: Oncology

## 2014-05-11 ENCOUNTER — Encounter: Payer: Self-pay | Admitting: Oncology

## 2014-05-11 NOTE — Progress Notes (Unsigned)
Patient ID: LINVILLE DECAROLIS, male   DOB: November 13, 1930, 81 y.o.   MRN: [id number] Telephone conversation with Dr. Tamala Julian last evening to review results of bone marrow biopsy done last week. Compared with the study we did in 2013, marrow remains hypercellular for age, there have been progressive dysplastic changes in all 3 cell lines particularly in the megakaryocytes. There are now 7% myeloblasts. Cytogenetic studies are pending. He now is in a high risk category with median time to progression to acute myeloid leukemia of about 3 months. He will be 81 years old next month. He has developed significant disability from idiopathic, marked bilateral lower extremity edema with recurrent cellulitis. I think the risk of putting him on a hypomethylating agent such as 5-azacytidine would be greater than any potential benefit. Blood counts typically get worse and it takes 2-3 months to see a response. We are almost guaranteed that he will become septic and went up in the hospital. I proposed to him that we palliate with darbepoetin to see if we can minimize transfusion requirements. I will wait to see what the cytogenetic studies show. If they remain normal, I will consider a trial of lenalidomide. I discussed my thoughts above with his internist Dr. Felipa Eth today. Short-term plan: Begin darbepoetin 300 g subcutaneous every 3 weeks starting on 05/12/2014.

## 2014-05-12 LAB — CHROMOSOME ANALYSIS, BONE MARROW

## 2014-05-12 LAB — TISSUE HYBRIDIZATION (BONE MARROW)-NCBH

## 2014-05-16 ENCOUNTER — Ambulatory Visit (HOSPITAL_COMMUNITY)
Admission: RE | Admit: 2014-05-16 | Discharge: 2014-05-16 | Disposition: A | Discharge status: Home / Self Care | Payer: Medicare Other | Source: Ambulatory Visit | Attending: Oncology | Admitting: Oncology

## 2014-05-16 ENCOUNTER — Other Ambulatory Visit: Payer: Self-pay | Admitting: Oncology

## 2014-05-16 DIAGNOSIS — D46Z Other myelodysplastic syndromes: Secondary | ICD-10-CM | POA: Diagnosis present

## 2014-05-16 DIAGNOSIS — D72821 Monocytosis (symptomatic): Secondary | ICD-10-CM | POA: Insufficient documentation

## 2014-05-16 DIAGNOSIS — D61818 Other pancytopenia: Secondary | ICD-10-CM

## 2014-05-16 DIAGNOSIS — D4620 Refractory anemia with excess of blasts, unspecified: Secondary | ICD-10-CM

## 2014-05-16 LAB — PREPARE RBC (CROSSMATCH)

## 2014-05-16 MED ORDER — DARBEPOETIN ALFA 150 MCG/0.3ML IJ SOSY
PREFILLED_SYRINGE | INTRAMUSCULAR | Status: AC
  Filled 2014-05-16: qty 0.6

## 2014-05-16 MED ORDER — SODIUM CHLORIDE 0.9 % IV SOLN: Freq: Once | INTRAVENOUS | Status: DC

## 2014-05-16 MED ORDER — DARBEPOETIN ALFA 300 MCG/0.6ML IJ SOSY
300.0000 ug | PREFILLED_SYRINGE | INTRAMUSCULAR | Status: DC
  Administered 2014-05-16: 300 ug via SUBCUTANEOUS
  Filled 2014-05-16: qty 0.6

## 2014-05-17 ENCOUNTER — Encounter (HOSPITAL_COMMUNITY)
Admission: RE | Admit: 2014-05-17 | Discharge: 2014-05-17 | Disposition: A | Discharge status: Home / Self Care | Payer: Medicare Other | Source: Ambulatory Visit | Attending: Oncology | Admitting: Oncology

## 2014-05-17 VITALS — BP 164/87 | HR 77 | Temp 98.2°F | Resp 20 | Ht 73.0 in | Wt 215.0 lb

## 2014-05-17 DIAGNOSIS — D46Z Other myelodysplastic syndromes: Secondary | ICD-10-CM

## 2014-05-17 LAB — POCT HEMOGLOBIN-HEMACUE: HEMOGLOBIN: 6.1 g/dL — AB (ref 13.0–17.0)

## 2014-05-17 MED ORDER — FUROSEMIDE 10 MG/ML IJ SOLN
20.0000 mg | Freq: Once | INTRAMUSCULAR | Status: AC
  Administered 2014-05-17: 20 mg via INTRAVENOUS

## 2014-05-17 MED ORDER — FUROSEMIDE 10 MG/ML IJ SOLN
INTRAMUSCULAR | Status: AC
  Administered 2014-05-17: 20 mg via INTRAVENOUS
  Filled 2014-05-17: qty 2

## 2014-05-17 MED ORDER — SODIUM CHLORIDE 0.9 % IV SOLN: Freq: Once | INTRAVENOUS | Status: DC

## 2014-05-18 LAB — TYPE AND SCREEN
ABO/RH(D): O POS
Antibody Screen: NEGATIVE
UNIT DIVISION: 0
UNIT DIVISION: 0

## 2014-05-20 ENCOUNTER — Encounter (HOSPITAL_BASED_OUTPATIENT_CLINIC_OR_DEPARTMENT_OTHER): Payer: Medicare Other | Attending: Internal Medicine

## 2014-05-20 DIAGNOSIS — L97811 Non-pressure chronic ulcer of other part of right lower leg limited to breakdown of skin: Secondary | ICD-10-CM | POA: Diagnosis not present

## 2014-05-20 DIAGNOSIS — I890 Lymphedema, not elsewhere classified: Secondary | ICD-10-CM | POA: Insufficient documentation

## 2014-05-20 DIAGNOSIS — I87331 Chronic venous hypertension (idiopathic) with ulcer and inflammation of right lower extremity: Secondary | ICD-10-CM | POA: Diagnosis present

## 2014-05-21 NOTE — Progress Notes (Signed)
[facility]  NAME:  Joel Macdonald, Joel Macdonald NO.:  [id number]  MEDICAL RECORD NO.:  [id number]      DATE OF BIRTH:  02-16-1931  PHYSICIAN:  Ricard Dillon, M.D. VISIT DATE:  05/20/2014                                  OFFICE VISIT   HISTORY OF PRESENT ILLNESS:  Dr. Tamala Julian comes in today referred through the curtesy of Dr. Christiane [place] at [facility] at [place].  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  Over the last several years, he tells me he has had increasing problems with lower extremity edema.  It sounds as though he has been thoroughly evaluated for this including a reasonably normal arterial Doppler including normal ABIs and recently normal toe brachial pressure.  Also, a duplex ultrasound of his legs did not show any evidence of a DVT, but did show significant interstitial fluid.  According to you Dr. Tamala Julian has also been evaluated for an intrapelvic IVC related thrombosis or compression with an MRI of the pelvis; although, I have not exactly verified this today.  PAST MEDICAL HISTORY:  Includes psoriasis, myelodysplastic syndrome, tricuspid regurgitation, osteoarthritis, lumbar decompression with a fracture at T12, gastroesophageal reflux, hypothyroidism, chronic venous insufficiency.  PAST SURGICAL HISTORY:  Squamous cell CA, removed from a lip, prostatectomy, melanoma on the forehead and back, bilateral cataracts, and a tonsillectomy.  CURRENT MEDICATIONS:  Include Alka-Seltzer 650 b.i.d.; Temovate 0.05% twice a day; Zantac 150 p.o. p.r.n.; and Synthroid __________daily.  PHYSICAL EXAMINATION:  VITAL SIGNS:  Temperature is 97.8, pulse 86, respirations 20, blood pressure 177/84.  The patient is not a diabetic.  Vascular assessment is noted his previous ABIs done that vein and vascular showed an ABI of  1.18 on the right, 1.12 on the left.  The wound exam shows the area on the right lateral lower extremity at 1 x 1 x 0.1.  This did not need debridement.  There was no evidence of infection.  More problematically than this is really tight edema that spreads up his right leg to well above his knee.  The similar findings on the left, this is nonpitting and really looks like lymphedema.  Respiratory exam is clear.  Cardiac exam, there is a 2/6 pansystolic murmur at the lower left sternal border.  His jugular venous pressure is mildly elevated at 45 degrees.  He does have pitting edema in the coccyx.  IMPRESSION/PLAN: 1. Chronic probably secondary lymphedema.  I think there is coexistent     chronic venous hypertension as well.  The wound on the right     lateral lower extremity was dressed with silver collagen and the     patient was put in [place] wraps. 2. Secondary lymphedema.  I would also say he has chronic venous     insufficiency and probably in somewhat of systemically fluid     overloaded state.  He tells me that he has a "borderline nephrotic     syndrome" and also I note the tricuspid regurgitation in his     problem list.  He is undoubtedly going to need to have compression.  The patient tells me that he has had compression stockings before     and could not get them on.  If we were going to go that route     currently, he would need above knee compression stockings, which     would be much more difficult to get on.  Fortunately, there is an     option of external compression pump and I really think this is the     route we need to go with this gentleman's lower-extremity edema. 3. Severe dry skin in the lower extremities.  It does not particularly     look like psoriasis.  We used TCA and BagBalm on the legs under     the [place] wraps.  I will see him again in a week's time.  By that time, I will have a better chance to look through [facility].  I suspect he is  going to need graded pressure external compression pumps.  We will begin the process of trying to work through his insurance and supplier to get this into his home.          ______________________________ Ricard Dillon, M.D.     MGR/MEDQ  D:  05/20/2014  T:  05/21/2014  Job:  [id number]

## 2014-05-27 DIAGNOSIS — L97811 Non-pressure chronic ulcer of other part of right lower leg limited to breakdown of skin: Secondary | ICD-10-CM | POA: Diagnosis not present

## 2014-05-27 DIAGNOSIS — I890 Lymphedema, not elsewhere classified: Secondary | ICD-10-CM | POA: Diagnosis not present

## 2014-05-27 DIAGNOSIS — I87331 Chronic venous hypertension (idiopathic) with ulcer and inflammation of right lower extremity: Secondary | ICD-10-CM | POA: Diagnosis not present

## 2014-06-03 DIAGNOSIS — I87331 Chronic venous hypertension (idiopathic) with ulcer and inflammation of right lower extremity: Secondary | ICD-10-CM | POA: Diagnosis not present

## 2014-06-03 DIAGNOSIS — I890 Lymphedema, not elsewhere classified: Secondary | ICD-10-CM | POA: Diagnosis not present

## 2014-06-03 DIAGNOSIS — L97811 Non-pressure chronic ulcer of other part of right lower leg limited to breakdown of skin: Secondary | ICD-10-CM | POA: Diagnosis not present

## 2014-06-06 ENCOUNTER — Other Ambulatory Visit: Payer: Self-pay | Admitting: Oncology

## 2014-06-06 ENCOUNTER — Encounter (HOSPITAL_COMMUNITY)
Admission: RE | Admit: 2014-06-06 | Discharge: 2014-06-06 | Disposition: A | Discharge status: Home / Self Care | Payer: Medicare Other | Source: Ambulatory Visit | Attending: Oncology | Admitting: Oncology

## 2014-06-06 DIAGNOSIS — D72821 Monocytosis (symptomatic): Secondary | ICD-10-CM | POA: Insufficient documentation

## 2014-06-06 DIAGNOSIS — D4620 Refractory anemia with excess of blasts, unspecified: Secondary | ICD-10-CM

## 2014-06-06 DIAGNOSIS — D619 Aplastic anemia, unspecified: Secondary | ICD-10-CM | POA: Diagnosis present

## 2014-06-06 DIAGNOSIS — D61818 Other pancytopenia: Secondary | ICD-10-CM

## 2014-06-06 DIAGNOSIS — D46Z Other myelodysplastic syndromes: Secondary | ICD-10-CM | POA: Diagnosis present

## 2014-06-06 DIAGNOSIS — D539 Nutritional anemia, unspecified: Secondary | ICD-10-CM

## 2014-06-06 LAB — POCT HEMOGLOBIN-HEMACUE: HEMOGLOBIN: 7.3 g/dL — AB (ref 13.0–17.0)

## 2014-06-06 LAB — PREPARE RBC (CROSSMATCH)

## 2014-06-06 MED ORDER — DARBEPOETIN ALFA 300 MCG/0.6ML IJ SOSY
300.0000 ug | PREFILLED_SYRINGE | INTRAMUSCULAR | Status: DC
  Filled 2014-06-06: qty 0.6

## 2014-06-07 ENCOUNTER — Encounter (HOSPITAL_COMMUNITY)
Admission: RE | Admit: 2014-06-07 | Discharge: 2014-06-07 | Disposition: A | Discharge status: Home / Self Care | Payer: Medicare Other | Source: Ambulatory Visit | Attending: Oncology | Admitting: Oncology

## 2014-06-07 VITALS — BP 159/84 | HR 74 | Temp 97.8°F | Resp 20

## 2014-06-07 DIAGNOSIS — D619 Aplastic anemia, unspecified: Secondary | ICD-10-CM | POA: Insufficient documentation

## 2014-06-07 DIAGNOSIS — D72821 Monocytosis (symptomatic): Secondary | ICD-10-CM | POA: Diagnosis present

## 2014-06-07 DIAGNOSIS — D46Z Other myelodysplastic syndromes: Secondary | ICD-10-CM | POA: Insufficient documentation

## 2014-06-07 DIAGNOSIS — D539 Nutritional anemia, unspecified: Secondary | ICD-10-CM

## 2014-06-07 MED ORDER — SODIUM CHLORIDE 0.9 % IV SOLN: Freq: Once | INTRAVENOUS | Status: DC

## 2014-06-07 MED ORDER — DARBEPOETIN ALFA 100 MCG/0.5ML IJ SOSY
PREFILLED_SYRINGE | INTRAMUSCULAR | Status: AC
  Administered 2014-06-06: 100 ug via SUBCUTANEOUS
  Filled 2014-06-07: qty 0.5

## 2014-06-07 MED ORDER — FUROSEMIDE 10 MG/ML IJ SOLN
20.0000 mg | Freq: Once | INTRAMUSCULAR | Status: AC
  Administered 2014-06-07: 20 mg via INTRAVENOUS

## 2014-06-07 MED ORDER — DARBEPOETIN ALFA 200 MCG/0.4ML IJ SOSY
PREFILLED_SYRINGE | INTRAMUSCULAR | Status: AC
Start: 2014-06-07 — End: 2014-06-06
  Administered 2014-06-06: 200 ug via SUBCUTANEOUS
  Filled 2014-06-07: qty 0.4

## 2014-06-08 LAB — TYPE AND SCREEN
ABO/RH(D): O POS
ANTIBODY SCREEN: NEGATIVE
Unit division: 0
Unit division: 0

## 2014-06-08 MED ORDER — FUROSEMIDE 10 MG/ML IJ SOLN
INTRAMUSCULAR | Status: AC
  Filled 2014-06-08: qty 2

## 2014-06-13 ENCOUNTER — Ambulatory Visit (INDEPENDENT_AMBULATORY_CARE_PROVIDER_SITE_OTHER): Payer: Medicare Other | Admitting: Oncology

## 2014-06-13 ENCOUNTER — Encounter: Payer: Self-pay | Admitting: Oncology

## 2014-06-13 VITALS — BP 145/73 | HR 72 | Temp 97.4°F | Ht 71.0 in | Wt 189.2 lb

## 2014-06-13 DIAGNOSIS — D472 Monoclonal gammopathy: Secondary | ICD-10-CM

## 2014-06-13 DIAGNOSIS — D72821 Monocytosis (symptomatic): Secondary | ICD-10-CM

## 2014-06-13 DIAGNOSIS — D61818 Other pancytopenia: Secondary | ICD-10-CM

## 2014-06-13 DIAGNOSIS — D46Z Other myelodysplastic syndromes: Secondary | ICD-10-CM

## 2014-06-13 HISTORY — DX: Other myelodysplastic syndromes: D46.Z

## 2014-06-13 LAB — CBC WITH DIFFERENTIAL/PLATELET
BASOS ABS: 0 10*3/uL (ref 0.0–0.1)
BASOS PCT: 0 % (ref 0–1)
EOS PCT: 0 % (ref 0–5)
Eosinophils Absolute: 0 10*3/uL (ref 0.0–0.7)
HCT: 30 % — ABNORMAL LOW (ref 39.0–52.0)
Hemoglobin: 9.8 g/dL — ABNORMAL LOW (ref 13.0–17.0)
Lymphocytes Relative: 31 % (ref 12–46)
Lymphs Abs: 0.9 10*3/uL (ref 0.7–4.0)
MCH: 31.3 pg (ref 26.0–34.0)
MCHC: 32.7 g/dL (ref 30.0–36.0)
MCV: 95.8 fL (ref 78.0–100.0)
MPV: 11.5 fL (ref 8.6–12.4)
Monocytes Absolute: 1.5 10*3/uL — ABNORMAL HIGH (ref 0.1–1.0)
Monocytes Relative: 50 % — ABNORMAL HIGH (ref 3–12)
Neutro Abs: 0.6 10*3/uL — ABNORMAL LOW (ref 1.7–7.7)
Neutrophils Relative %: 19 % — ABNORMAL LOW (ref 43–77)
PLATELETS: 191 10*3/uL (ref 150–400)
RBC: 3.13 MIL/uL — AB (ref 4.22–5.81)
RDW: 18.3 % — AB (ref 11.5–15.5)
WBC: 2.9 10*3/uL — ABNORMAL LOW (ref 4.0–10.5)

## 2014-06-13 LAB — IRON AND TIBC
%SAT: 25 % (ref 20–55)
Iron: 60 ug/dL (ref 42–165)
TIBC: 239 ug/dL (ref 215–435)
UIBC: 179 ug/dL (ref 125–400)

## 2014-06-13 LAB — COMPREHENSIVE METABOLIC PANEL
ALBUMIN: 3.8 g/dL (ref 3.5–5.2)
ALK PHOS: 82 U/L (ref 39–117)
ALT: <8 U/L (ref 0–53)
AST: 14 U/L (ref 0–37)
BUN: 16 mg/dL (ref 6–23)
CALCIUM: 9.1 mg/dL (ref 8.4–10.5)
CO2: 26 mEq/L (ref 19–32)
CREATININE: 0.72 mg/dL (ref 0.50–1.35)
Chloride: 100 mEq/L (ref 96–112)
GLUCOSE: 86 mg/dL (ref 70–99)
Potassium: 4.6 mEq/L (ref 3.5–5.3)
Sodium: 135 mEq/L (ref 135–145)
Total Bilirubin: 0.8 mg/dL (ref 0.2–1.2)
Total Protein: 7.1 g/dL (ref 6.0–8.3)

## 2014-06-13 LAB — RETICULOCYTES
ABS RETIC: 59.5 10*3/uL (ref 19.0–186.0)
RBC.: 3.13 MIL/uL — ABNORMAL LOW (ref 4.22–5.81)
RETIC CT PCT: 1.9 % (ref 0.4–2.3)

## 2014-06-13 LAB — LACTATE DEHYDROGENASE: LDH: 198 U/L (ref 94–250)

## 2014-06-13 LAB — FERRITIN: Ferritin: 540 ng/mL — ABNORMAL HIGH (ref 22–322)

## 2014-06-13 LAB — SAVE SMEAR

## 2014-06-13 NOTE — Progress Notes (Signed)
Patient ID: Joel Macdonald, male   DOB: May 20, 1930, 81 y.o.   MRN: [id number] Hematology and Oncology Follow Up Visit  Joel Macdonald [id number] 1930-08-01 81 y.o. 06/13/2014 10:20 AM   Principle Diagnosis: Encounter Diagnoses  Name Primary?  . Pancytopenia, acquired Yes  . MGUS (monoclonal gammopathy of unknown significance)   . Monocytosis   . 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 Bone marrow biopsy done 06/13/2011 showed changes of myelodysplasia with dyserythropoiesis and dysmegakaryopoiesis. There were some focal plasmacytoid infiltrates. He did have a borderline elevation of his serum IgM. There were no excess blasts. Cytogenetic studies were normal. Blood counts have been overall stable over time with some minor fluctuations. Overall he was doing well until one year ago. He had a flulike illness in December 2014 with a persistent cough not responding to antibacterial antibiotics and the presence of oral ulcerations typical of a virus infection. He subsequently began to develop first right leg then left leg cellulitis and swelling. Over the next few months, he had a number of venous and arterial Doppler studies which failed to show any evidence for thrombosis. He was treated with multiple courses of antibiotics with minimal response and persistent, and progressive, brawny edema of his lower extremities. MRI of his right leg did not show any evidence for osteomyelitis. A routine urinalysis showed no protein and serum albumin initially normal with most recent value slightly decreased at 3.3 g. His performance status has declined over the same interval with increasing malaise, and anorexia.  His internist began a trial of high-dose diuretics . Iinitially he lost some weight. An echocardiogram  was ordered to assess possibility of clot in his IVC. This showed a normal left ventricular ejection fraction. No significant valvular abnormalities. Normal flow in the IVC with no evidence of clot.  Incidentally noted on a follow-up venous Doppler study done on December 11 was right inguinal adenopathy. This was followed up with a CT scan of the abdomen and pelvis done on 03/25/2014 which showed borderline bilateral iliac chain adenopathy with largest node on the right side measuring 1.8 cm largest retroperitoneal node 0.9 cm. Spleen was not enlarged but did have some calcified granulomas. I did not feel that this degree of adenopathy was sufficient to explain his significant bilateral lower extremity edema. I obtained a 24 hour urine collection which did show nephrotic range proteinuria of 1 g. Serum albumin borderline decrease of 3.5 g. Renal function was normal until he began high-dose diuretics. Diuretics have been tapered. Renal function today back to normal with BUN 24 and creatinine 0.8.  I have been concerned that he developed some form of vasculitis when he developed ischemic changes of the digits of his toes bilaterally at time of my April 2015 exam. ESR elevated at 49 mm. Cryoglobulins not detected. ANCA screen negative.    Interim History:   Since last visit here on 05/03/2014, I repeated a bone marrow aspiration and biopsy in view of sudden progression of anemia and increase in peripheral blood monocytes up to 50% of the differential. The bone marrow shows that his myelodysplastic syndrome has progressed. Blast count 7% of the differential by light microscopy although a distinct increased blast population not seen on flow cytometry. There is trilineage dysplasia. Minimal plasmacytosis unchanged from previous marrow. Cytogenetic studies have returned showing no gross abnormalities including FISH  studies to look for deletions and chromosomes 5 and 7. In view of his poor performance status and  other comorbid medical conditions, I do not feel that he is a candidate for chemotherapy with a hypomethylating agent such as 5-azacytidine or decitabine. I have elected to start him on Aranesp to see if we can get a red cell response since he has now become transfusion dependent for red cells. Aranesp 300 g every 3 week was started on 05/16/2014. It will take about 4-6 weeks to see if he responds. Baseline erythropoietin level is 144 units so there is a good chance that he will respond.  He has made some progress with his bilateral lower extremity edema. Referral was made to the wound center. They have been wrapping his legs and this has helped. He continues to lose weight rapidly. Although some of the weight was fluid with a peak weight of 227 pounds back in December 2015, he has lost muscle mass. Current weight is 189 pounds. Although food tastes good, he has early satiety and after he starts eating he gets a bad taste in his mouth and gets nauseous and stops eating.   Medications: reviewed  Allergies:  Allergies  Allergen Reactions  . Other Other (See Comments)    Ragweed causes nasal congestion    Review of Systems: See HPI Remaining ROS negative:   Physical Exam: Blood pressure 145/73, pulse 72, temperature 97.4 F (36.3 C), temperature source Oral, height 5' 11"  (1.803 m), weight 189 lb 3.2 oz (85.821 kg), SpO2 100 %. Wt Readings from Last 3 Encounters:  06/13/14 189 lb 3.2 oz (85.821 kg)  05/17/14 215 lb (97.523 kg)  05/05/14 221 lb 6.4 oz (100.426 kg)     General appearance: Caucasian man who is obviously losing weight and muscle mass from his face HENNT: Pharynx no erythema, exudate, mass, or ulcer. No thyromegaly or thyroid nodules Lymph nodes: No cervical, supraclavicular, or axillary lymphadenopathy Breasts:  Lungs: Clear to auscultation, resonant to percussion throughout Heart: Regular rhythm, no murmur, no gallop, no rub, no click, no edema Abdomen: Soft, nontender,  normal bowel sounds, no mass, no organomegaly Extremities: Persistent bilateral peripheral edema, right calf 42 cm left 37 cm, no calf tenderness Musculoskeletal: no joint deformities GU:  Vascular: Carotid pulses 2+, no bruits,  Neurologic: Alert, oriented, PERRLA,, cranial nerves grossly normal, motor strength 5 over 5, reflexes 1+ symmetric, upper body coordination normal, gait normal, Skin: No rash; persistent ecchymosis skin dorsum of his hands  Lab Results: CBC W/Diff    Component Value Date/Time   WBC 3.6* 05/05/2014 0937   WBC 3.0* 05/17/2013 1258   RBC 2.43* 05/05/2014 0937   RBC 3.01* 05/17/2013 1258   HGB 7.3* 06/06/2014 1412   HGB 10.4* 05/17/2013 1258   HCT 24.3* 05/05/2014 0937   HCT 31.5* 05/17/2013 1258   PLT 161 05/05/2014 0937   PLT 84* 05/17/2013 1258   MCV 100.0 05/05/2014 0937   MCV 104.7* 05/17/2013 1258   MCH 30.9 05/05/2014 0937   MCH 34.6* 05/17/2013 1258   MCHC 30.9 05/05/2014 0937   MCHC 33.0 05/17/2013 1258   RDW 18.2* 05/05/2014 0937   RDW 16.0* 05/17/2013 1258   LYMPHSABS 0.6* 05/05/2014 0937   LYMPHSABS 1.5 05/17/2013 1258   MONOABS 2.0* 05/05/2014 0937   MONOABS 0.8 05/17/2013 1258   EOSABS 0.0 05/05/2014 0937   EOSABS 0.0 05/17/2013 1258   BASOSABS 0.0 05/05/2014 0937   BASOSABS 0.0 05/17/2013 1258     Chemistry  Component Value Date/Time   NA 139 05/03/2014 1608   NA 139 11/09/2012 1425   K 4.5 05/03/2014 1608   K 4.2 11/09/2012 1425   CL 103 05/03/2014 1608   CO2 26 05/03/2014 1608   CO2 24 11/09/2012 1425   BUN 22 05/03/2014 1608   BUN 22.4 11/09/2012 1425   CREATININE 0.84 05/03/2014 1608   CREATININE 1.39* 03/23/2014 0730   CREATININE 0.95 07/12/2013 2036   CREATININE 1.2 11/09/2012 1425      Component Value Date/Time   CALCIUM 9.1 05/03/2014 1608   CALCIUM 9.3 11/09/2012 1425   ALKPHOS 70 05/03/2014 1608   ALKPHOS 62 11/09/2012 1425   AST 17 05/03/2014 1608   AST 20 11/09/2012 1425   ALT 9 05/03/2014 1608   ALT  19 11/09/2012 1425   BILITOT 0.6 05/03/2014 1608   BILITOT 0.57 11/09/2012 1425        Impression:  #1. Progression from low risk myelodysplastic syndrome diagnosed in February 2013 to high risk MDS as of bone marrow biopsy 05/05/2014. Plan: See above. Begin a trial of Aranesp. Transfuse red blood cells when necessary. Check iron stores to assess need for iron supplementation on Aranesp. Continue to monitor blood counts closely. The fact that his bone marrow disorder is unstable and may progress to acute leukemia discussed with the patient. Given his age and comorbid medical conditions, we have limited treatment options if this occurs.   #2. Idiopathic lymphedema, proteinuria, progressive anemia, low-grade, nonbulky lymphadenopathy without splenomegaly. Although the remote possibility that this represents a lymphoma exists, it would be unusual to have lymphoma coexisting with a myelodysplastic syndrome. I do not think the nonbulky lymphadenopathy is the reason for his idiopathic lymphedema.   #3. IgM monoclonal gammopathy of undetermined significance-no change over time. IgM level at baseline  #4. History of prostate cancer treated with primary surgery PSA remains undetectable as of most recent assay done 05/05/2014   CC: Patient Care Team: Lajean Manes, MD as PCP - General (Internal Medicine) Annia Belt, MD as Consulting Physician (Oncology)   Annia Belt, MD 3/7/201610:20 AM

## 2014-06-13 NOTE — Patient Instructions (Signed)
To lab today Return visit 6-8 weeks Lab day of visit

## 2014-06-14 ENCOUNTER — Encounter (HOSPITAL_COMMUNITY): Payer: Self-pay

## 2014-06-15 ENCOUNTER — Telehealth: Payer: Self-pay | Admitting: *Deleted

## 2014-06-15 NOTE — Telephone Encounter (Signed)
-----   Message from Annia Belt, MD sent at 06/14/2014  4:55 PM EST ----- Call pt blood counts stable.  Hb 9.8; kidney function back to normal.

## 2014-06-15 NOTE — Telephone Encounter (Signed)
Pt called/informed about his labs - stated he had already looked at them this morning; hgb up to 9.8 and kidney function back to normal per Dr. Beryle Beams.

## 2014-06-27 ENCOUNTER — Encounter (HOSPITAL_COMMUNITY)
Admission: RE | Admit: 2014-06-27 | Discharge: 2014-06-27 | Disposition: A | Discharge status: Home / Self Care | Payer: Medicare Other | Source: Ambulatory Visit | Attending: Oncology | Admitting: Oncology

## 2014-06-27 DIAGNOSIS — D61818 Other pancytopenia: Secondary | ICD-10-CM

## 2014-06-27 DIAGNOSIS — D4620 Refractory anemia with excess of blasts, unspecified: Secondary | ICD-10-CM

## 2014-06-27 DIAGNOSIS — D46Z Other myelodysplastic syndromes: Secondary | ICD-10-CM | POA: Diagnosis not present

## 2014-06-27 DIAGNOSIS — D72821 Monocytosis (symptomatic): Secondary | ICD-10-CM

## 2014-06-27 LAB — POCT HEMOGLOBIN-HEMACUE: Hemoglobin: 9.5 g/dL — ABNORMAL LOW (ref 13.0–17.0)

## 2014-06-27 MED ORDER — DARBEPOETIN ALFA 300 MCG/0.6ML IJ SOSY
300.0000 ug | PREFILLED_SYRINGE | INTRAMUSCULAR | Status: DC
Start: 2014-06-27 — End: 2014-06-28
  Administered 2014-06-27: 300 ug via SUBCUTANEOUS
  Filled 2014-06-27: qty 0.6

## 2014-06-28 MED ORDER — DARBEPOETIN ALFA 150 MCG/0.3ML IJ SOSY
PREFILLED_SYRINGE | INTRAMUSCULAR | Status: AC
  Filled 2014-06-28: qty 0.6

## 2014-07-17 ENCOUNTER — Other Ambulatory Visit: Payer: Self-pay | Admitting: Oncology

## 2014-07-17 DIAGNOSIS — D46Z Other myelodysplastic syndromes: Secondary | ICD-10-CM

## 2014-07-18 ENCOUNTER — Encounter (HOSPITAL_COMMUNITY)
Admission: RE | Admit: 2014-07-18 | Discharge: 2014-07-18 | Disposition: A | Discharge status: Home / Self Care | Payer: Medicare Other | Source: Ambulatory Visit | Attending: Oncology | Admitting: Oncology

## 2014-07-18 DIAGNOSIS — D4620 Refractory anemia with excess of blasts, unspecified: Secondary | ICD-10-CM

## 2014-07-18 DIAGNOSIS — D46Z Other myelodysplastic syndromes: Secondary | ICD-10-CM | POA: Insufficient documentation

## 2014-07-18 DIAGNOSIS — D619 Aplastic anemia, unspecified: Secondary | ICD-10-CM | POA: Insufficient documentation

## 2014-07-18 DIAGNOSIS — D72821 Monocytosis (symptomatic): Secondary | ICD-10-CM | POA: Insufficient documentation

## 2014-07-18 DIAGNOSIS — D61818 Other pancytopenia: Secondary | ICD-10-CM

## 2014-07-18 LAB — POCT HEMOGLOBIN-HEMACUE: HEMOGLOBIN: 8.3 g/dL — AB (ref 13.0–17.0)

## 2014-07-18 MED ORDER — DARBEPOETIN ALFA 100 MCG/0.5ML IJ SOSY
PREFILLED_SYRINGE | INTRAMUSCULAR | Status: AC
  Administered 2014-07-18: 100 ug via SUBCUTANEOUS
  Filled 2014-07-18: qty 0.5

## 2014-07-18 MED ORDER — DARBEPOETIN ALFA 300 MCG/0.6ML IJ SOSY
300.0000 ug | PREFILLED_SYRINGE | Freq: Once | INTRAMUSCULAR | Status: DC
  Filled 2014-07-18: qty 0.6

## 2014-07-18 MED ORDER — DARBEPOETIN ALFA 200 MCG/0.4ML IJ SOSY
PREFILLED_SYRINGE | INTRAMUSCULAR | Status: AC
  Administered 2014-07-18: 200 ug via SUBCUTANEOUS
  Filled 2014-07-18: qty 0.4

## 2014-07-20 ENCOUNTER — Telehealth: Payer: Self-pay | Admitting: *Deleted

## 2014-07-20 ENCOUNTER — Other Ambulatory Visit: Payer: Self-pay | Admitting: Geriatric Medicine

## 2014-07-20 ENCOUNTER — Ambulatory Visit
Admission: RE | Admit: 2014-07-20 | Discharge: 2014-07-20 | Disposition: A | Discharge status: Home / Self Care | Payer: Medicare Other | Source: Ambulatory Visit | Attending: Geriatric Medicine | Admitting: Geriatric Medicine

## 2014-07-20 DIAGNOSIS — M5489 Other dorsalgia: Secondary | ICD-10-CM

## 2014-07-20 NOTE — Telephone Encounter (Signed)
-----   Message from Annia Belt, MD sent at 07/19/2014  8:53 AM EDT ----- Call [name]:  Hb has drifted down to 8.3 after initial rise to 9.5.  Continue Q 3 week aranesp injections. If next Hb not better, I will increase dose.

## 2014-07-20 NOTE — Telephone Encounter (Signed)
Pt called / informed Hb is down to 8.3 from 9.5 and to continue Q 3 week Aranesp injections. And if next Hb level is not better,he will increase dose per [name]. [name] said his next Aranesp injection is May 2.  And he does not have an appt scheduled w/you. I will send [name] a message.

## 2014-07-27 ENCOUNTER — Ambulatory Visit: Payer: Medicare Other | Admitting: Podiatry

## 2014-08-08 ENCOUNTER — Encounter (HOSPITAL_COMMUNITY)
Admission: RE | Admit: 2014-08-08 | Discharge: 2014-08-08 | Disposition: A | Discharge status: Home / Self Care | Payer: Medicare Other | Source: Ambulatory Visit | Attending: Oncology | Admitting: Oncology

## 2014-08-08 DIAGNOSIS — D619 Aplastic anemia, unspecified: Secondary | ICD-10-CM | POA: Insufficient documentation

## 2014-08-08 DIAGNOSIS — D72821 Monocytosis (symptomatic): Secondary | ICD-10-CM | POA: Insufficient documentation

## 2014-08-08 DIAGNOSIS — D46Z Other myelodysplastic syndromes: Secondary | ICD-10-CM | POA: Insufficient documentation

## 2014-08-08 DIAGNOSIS — D61818 Other pancytopenia: Secondary | ICD-10-CM

## 2014-08-08 DIAGNOSIS — D4620 Refractory anemia with excess of blasts, unspecified: Secondary | ICD-10-CM

## 2014-08-08 LAB — POCT HEMOGLOBIN-HEMACUE: HEMOGLOBIN: 8.3 g/dL — AB (ref 13.0–17.0)

## 2014-08-08 MED ORDER — DARBEPOETIN ALFA 300 MCG/0.6ML IJ SOSY
300.0000 ug | PREFILLED_SYRINGE | INTRAMUSCULAR | Status: DC
  Administered 2014-08-08: 300 ug via SUBCUTANEOUS
  Filled 2014-08-08: qty 0.6

## 2014-08-08 MED ORDER — DARBEPOETIN ALFA 150 MCG/0.3ML IJ SOSY
PREFILLED_SYRINGE | INTRAMUSCULAR | Status: AC
  Filled 2014-08-08: qty 0.6

## 2014-08-16 ENCOUNTER — Other Ambulatory Visit: Payer: Self-pay | Admitting: Oncology

## 2014-08-16 ENCOUNTER — Other Ambulatory Visit: Payer: Medicare Other

## 2014-08-16 ENCOUNTER — Ambulatory Visit (INDEPENDENT_AMBULATORY_CARE_PROVIDER_SITE_OTHER): Payer: Medicare Other | Admitting: Oncology

## 2014-08-16 VITALS — BP 152/82 | HR 76 | Temp 97.9°F | Ht 71.0 in | Wt 190.5 lb

## 2014-08-16 DIAGNOSIS — Q820 Hereditary lymphedema: Secondary | ICD-10-CM

## 2014-08-16 DIAGNOSIS — B9689 Other specified bacterial agents as the cause of diseases classified elsewhere: Secondary | ICD-10-CM

## 2014-08-16 DIAGNOSIS — R809 Proteinuria, unspecified: Secondary | ICD-10-CM

## 2014-08-16 DIAGNOSIS — D472 Monoclonal gammopathy: Secondary | ICD-10-CM | POA: Diagnosis not present

## 2014-08-16 DIAGNOSIS — L03115 Cellulitis of right lower limb: Secondary | ICD-10-CM

## 2014-08-16 DIAGNOSIS — D72821 Monocytosis (symptomatic): Secondary | ICD-10-CM

## 2014-08-16 DIAGNOSIS — D4620 Refractory anemia with excess of blasts, unspecified: Secondary | ICD-10-CM

## 2014-08-16 DIAGNOSIS — D61818 Other pancytopenia: Secondary | ICD-10-CM

## 2014-08-16 DIAGNOSIS — R591 Generalized enlarged lymph nodes: Secondary | ICD-10-CM

## 2014-08-16 DIAGNOSIS — D46Z Other myelodysplastic syndromes: Secondary | ICD-10-CM

## 2014-08-16 DIAGNOSIS — Z8546 Personal history of malignant neoplasm of prostate: Secondary | ICD-10-CM

## 2014-08-16 LAB — CBC WITH DIFFERENTIAL/PLATELET
Basophils Absolute: 0 10*3/uL (ref 0.0–0.1)
Basophils Relative: 0 % (ref 0–1)
EOS PCT: 0 % (ref 0–5)
Eosinophils Absolute: 0 10*3/uL (ref 0.0–0.7)
HCT: 26.4 % — ABNORMAL LOW (ref 39.0–52.0)
HEMOGLOBIN: 8.2 g/dL — AB (ref 13.0–17.0)
Lymphocytes Relative: 35 % (ref 12–46)
Lymphs Abs: 1.2 10*3/uL (ref 0.7–4.0)
MCH: 32.5 pg (ref 26.0–34.0)
MCHC: 31.1 g/dL (ref 30.0–36.0)
MCV: 104.8 fL — AB (ref 78.0–100.0)
MONO ABS: 1.2 10*3/uL — AB (ref 0.1–1.0)
Monocytes Relative: 37 % — ABNORMAL HIGH (ref 3–12)
NEUTROS PCT: 28 % — AB (ref 43–77)
Neutro Abs: 1 10*3/uL — ABNORMAL LOW (ref 1.7–7.7)
Platelets: 144 10*3/uL — ABNORMAL LOW (ref 150–400)
RBC: 2.52 MIL/uL — ABNORMAL LOW (ref 4.22–5.81)
RDW: 17.4 % — ABNORMAL HIGH (ref 11.5–15.5)
WBC: 3.4 10*3/uL — ABNORMAL LOW (ref 4.0–10.5)

## 2014-08-16 LAB — SAVE SMEAR

## 2014-08-16 NOTE — Progress Notes (Signed)
Patient ID: Joel Macdonald, male   DOB: [birth year], 81 y.o.   MRN: [id number] Hematology and Oncology Follow Up Visit  Joel Macdonald [id number] [birth year] 81 y.o. 08/16/2014 5:26 PM   Principle Diagnosis: Encounter Diagnoses  Name Primary?  . Pancytopenia, acquired   . MGUS (monoclonal gammopathy of unknown significance)   . Monocytosis   . MDS (myelodysplastic syndrome), high grade   . 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 Bone marrow biopsy done 06/13/2011 showed changes of myelodysplasia with dyserythropoiesis and dysmegakaryopoiesis. There were some focal plasmacytoid infiltrates. He did have a borderline elevation of his serum IgM. There were no excess blasts. Cytogenetic studies were normal. Blood counts initially stable over time with some minor fluctuations. Overall he was doing well until one year ago. He had a flulike illness in December 2014 with a persistent cough not responding to antibacterial antibiotics and the presence of oral ulcerations typical of a virus infection. He subsequently began to develop first right leg, then left leg, cellulitis and swelling. Over the next few months, he had a number of venous and arterial Doppler studies which failed to show any evidence for thrombosis. He was treated with multiple courses of antibiotics with minimal response and persistent, and progressive, brawny edema of his lower extremities. MRI of his right leg did not show any evidence for osteomyelitis. A routine urinalysis showed no protein and serum albumin initially normal with most recent value slightly decreased at 3.3 g. His performance status  declined over the same interval with increasing malaise,  Anorexia, and weight loss.  His internist began a trial of high-dose diuretics  . Iinitially he lost some weight. An echocardiogram was ordered to assess possibility of clot in his IVC. This showed a normal left ventricular ejection fraction. No significant valvular abnormalities. Normal flow in the IVC with no evidence of clot.  Incidentally noted on a follow-up venous Doppler study done on March 18, 2014,  was right inguinal adenopathy. This was followed up with a CT scan of the abdomen and pelvis done on 03/25/2014 which showed borderline bilateral iliac chain adenopathy with largest node on the right side measuring 1.8 cm largest retroperitoneal node 0.9 cm. Spleen was not enlarged but did have some calcified granulomas. I did not feel that this degree of adenopathy was sufficient to explain his significant bilateral lower extremity edema. I obtained a 24 hour urine collection which did show nephrotic range proteinuria of 1 g. Serum albumin borderline decrease of 3.5 g. Renal function was normal until he began high-dose diuretics. Diuretics have been tapered. Renal functionreturned back to normal with BUN 24 and creatinine 0.8. I was concerned that he developed some form of vasculitis when he developed ischemic changes of the digits of his toes bilaterally at time of my April 2015 exam. ESR elevated at 49 mm. Cryoglobulins not detected. ANCA screen negative.  In January 2016, his blood counts began to deteriorate and he developed transfusion dependent anemia and peripheral blood monocytosis up to 50%. Bone marrow aspiration and biopsy done 05/03/2014 show progression to a high risk myelodysplastic syndrome with 7% blasts on light microscopy but not by flow cytometry. Cytogenetic studies remained normal. In view of his poor performance status, recurrent cellulitis, unexplained bilateral lymphedema, I did not feel he was a candidate for aggressive chemotherapy and elected to begin a  trial of an erythrocyte stimulating agent (Aranesp) 300 g every 3 weeks.   Interim History:    Since visit here in March there has been some stabilization of his condition. His peripheral edema is down considerably after Dr. Dellia Nims from [facility] care did leg wrappings for 2 weeks. He is now wearing elastic stockings to the knee. Food still doesn't taste good but he is forcing himself to eat and I suggested last time that he increase fatty foods such as ice cream. His weight has plateaued with no further loss compared to his March 7 weight which was 189 pounds with today's weight 190.5 pounds. His color looks much better. On the Aranesp, although we did not see a significant rise in his hemoglobin, he is now consistently at 8 g and has not required any additional transfusions. He was in to see his dermatologist yesterday. On exam, he had a new area of cellulitis on the lateral aspect of his right foot and he was started back on a course of oral doxycycline. He reports to me that over the weekend he had sudden onset of shaking chills which lasted for about 30 minutes. He did not take his temperature but felt hot when the chills resolved. Symptoms resolved and did not recur. He had an episode of profuse epistaxis which lasted for about 12 hours before it finally stopped. Of note, up until now his platelet count has been normal. He did fall down a few weeks ago and had persistent low back pain. X-rays were done and showed a compression fracture at L2.  Medications: reviewed  Allergies:  Allergies  Allergen Reactions  . Nsaids Other (See Comments)  . Other Other (See Comments)    Ragweed causes nasal congestion    Review of Systems: See HPI Remaining ROS negative:   Physical Exam: Blood pressure 152/82, pulse 76, temperature 97.9 F (36.6 C), temperature source Oral, height _0  (1.803 m), weight 190 lb 8 oz (86.41 kg), SpO2 100 %. Wt Readings from Last 3 Encounters:  08/16/14 190 lb 8 oz (86.41 kg)  06/13/14 189 lb 3.2 oz (85.821 kg)  05/17/14 215 lb (97.523 kg)      General appearance: Caucasian man who appears adequately nourished HENNT: Pharynx no erythema, exudate, mass, or ulcer. No thyromegaly or thyroid nodules Lymph nodes: No cervical, supraclavicular, or axillary lymphadenopathy Breasts:  Lungs: Clear to auscultation, resonant to percussion throughout Heart: Regular rhythm, no murmur, no gallop, no rub, no click, no edema Abdomen: Soft, nontender, normal bowel sounds, no mass, no organomegaly Extremities: persistent but improved 3+ edema RLE, 2+ LLE edema, no calf tenderness; recurrent cellulitis affecting lateral aspect of right foot Musculoskeletal: no joint deformities GU:  Vascular: Carotid pulses 2+, no bruits, Neurologic: Alert, oriented, PERRL, cranial nerves grossly normal, motor strength 5 over 5, reflexes 1+ symmetric upper extremities, absent symmetric lower extremities, upper body coordination normal, gait normal, Skin: multiple  Ecchymoses skin of forearms.  Lab Results: CBC W/Diff    Component Value Date/Time   WBC 3.4* 08/16/2014 1528   WBC 3.0* 05/17/2013 1258   RBC 2.52* 08/16/2014 1528   RBC 3.13* 06/13/2014 1022   RBC 3.01* 05/17/2013 1258   HGB 8.2* 08/16/2014 1528   HGB 10.4* 05/17/2013 1258   HCT 26.4* 08/16/2014 1528   HCT 31.5* 05/17/2013 1258   PLT 144* 08/16/2014 1528   PLT 84* 05/17/2013 1258   MCV 104.8* 08/16/2014 1528   MCV 104.7* 05/17/2013 1258   MCH 32.5 08/16/2014 1528  MCH 34.6* 05/17/2013 1258   MCHC 31.1 08/16/2014 1528   MCHC 33.0 05/17/2013 1258   RDW 17.4* 08/16/2014 1528   RDW 16.0* 05/17/2013 1258   LYMPHSABS 1.2 08/16/2014 1528   LYMPHSABS 1.5 05/17/2013 1258   MONOABS 1.2* 08/16/2014 1528   MONOABS 0.8 05/17/2013 1258   EOSABS 0.0 08/16/2014 1528   EOSABS 0.0 05/17/2013 1258   BASOSABS 0.0 08/16/2014 1528   BASOSABS 0.0 05/17/2013 1258     Chemistry      Component Value Date/Time   NA 135 06/13/2014 1022   NA 139 11/09/2012 1425   K 4.6 06/13/2014 1022   K 4.2 11/09/2012  1425   CL 100 06/13/2014 1022   CO2 26 06/13/2014 1022   CO2 24 11/09/2012 1425   BUN 16 06/13/2014 1022   BUN 22.4 11/09/2012 1425   CREATININE 0.72 06/13/2014 1022   CREATININE 1.39* 03/23/2014 0730   CREATININE 0.95 07/12/2013 2036   CREATININE 1.2 11/09/2012 1425      Component Value Date/Time   CALCIUM 9.1 06/13/2014 1022   CALCIUM 9.3 11/09/2012 1425   ALKPHOS 82 06/13/2014 1022   ALKPHOS 62 11/09/2012 1425   AST 14 06/13/2014 1022   AST 20 11/09/2012 1425   ALT <8 06/13/2014 1022   ALT 19 11/09/2012 1425   BILITOT 0.8 06/13/2014 1022   BILITOT 0.57 11/09/2012 1425    Today's lab pending   Radiological Studies: Dg Lumbar Spine 2-3 Views  07/20/2014   CLINICAL DATA:  Low back pain, fell 7 weeks ago  EXAM: LUMBAR SPINE - 2-3 VIEW  COMPARISON:  CT abdomen pelvis of 03/25/2014  FINDINGS: There is approximately 80% compression deformity of L2 vertebral body with very minimal retropulsion. This is new compared to the sagittal images from CT of 03/25/2014. Degenerative disc disease is also noted but unchanged at L4-5 and L5-S1 levels. The SI joints are corticated.  IMPRESSION: 1. New approximately 80% compression deformity of L2 vertebral body compared to sagittal CT images of December of 2015. Slight retropulsion. 2. Degenerative disc disease at L4-5 and L5-S1.   Electronically Signed   By: Ivar Drape M.D.   On: 07/20/2014 16:02    Impression:  #1. Progression from low risk myelodysplastic syndrome diagnosed in February 2013 to high risk MDS as of bone marrow biopsy 05/05/2014. He has had a modest response to Aranesp at 300 g every 3 weeks. I'm going to increase the dose to 500 g to see if we can get his hemoglobin above 9 g. I will continue to monitor his blood counts and differentials. He is at high risk for conversion to acute leukemia over the next year.   #2. Idiopathic lymphedema, proteinuria, progressive anemia, low-grade, nonbulky lymphadenopathy without  splenomegaly. Although the remote possibility that this represents a lymphoma exists, it would be unusual to have lymphoma coexisting with a myelodysplastic syndrome. I do not think the nonbulky lymphadenopathy is the reason for his idiopathic lymphedema. I do not think we need to pursue a biopsy of these nodes.   #3. IgM monoclonal gammopathy of undetermined significance-no change over time. IgM level at baseline when checked recently on 05/03/2014.  #4. History of prostate cancer treated with primary surgery PSA remains undetectable as of most recent assay done 05/05/2014  #5. Recurrent cellulitis now affecting the right foot Total white count is low and the cells are incompetent to fight infection. He is afebrile today. He was just put back on oral doxycycline yesterday. If any progression he  will need parenteral antibiotics.  CC: Patient Care Team: Lajean Manes, MD as PCP - General (Internal Medicine) Annia Belt, MD as Consulting Physician (Oncology)   Annia Belt, MD 5/10/20165:26 PM

## 2014-08-16 NOTE — Patient Instructions (Signed)
MD visit 8-10 weeks Lab day before visit

## 2014-08-17 ENCOUNTER — Telehealth: Payer: Self-pay | Admitting: *Deleted

## 2014-08-17 NOTE — Telephone Encounter (Signed)
-----   Message from Annia Belt, MD sent at 08/16/2014  5:20 PM EDT ----- Call [name] - Hb holding at 8.2;  WBC/percent neutrophils a little better/ platelets down slightly

## 2014-08-17 NOTE — Telephone Encounter (Signed)
Pt called about his labs results; stated he had viewed them on My chart last night. Informed him his Hgb is holding @ 8.2; Platelets are down slightly @144  and WBC/neutrophils are a little better. Stated good and his ankle feels better this morning.

## 2014-08-29 ENCOUNTER — Encounter (HOSPITAL_COMMUNITY)
Admission: RE | Admit: 2014-08-29 | Discharge: 2014-08-29 | Disposition: A | Discharge status: Home / Self Care | Payer: Medicare Other | Source: Ambulatory Visit | Attending: Oncology | Admitting: Oncology

## 2014-08-29 VITALS — BP 154/85 | HR 85 | Temp 97.7°F | Resp 20

## 2014-08-29 DIAGNOSIS — D46Z Other myelodysplastic syndromes: Secondary | ICD-10-CM | POA: Diagnosis not present

## 2014-08-29 DIAGNOSIS — D4620 Refractory anemia with excess of blasts, unspecified: Secondary | ICD-10-CM

## 2014-08-29 DIAGNOSIS — D72821 Monocytosis (symptomatic): Secondary | ICD-10-CM

## 2014-08-29 DIAGNOSIS — D61818 Other pancytopenia: Secondary | ICD-10-CM

## 2014-08-29 LAB — POCT HEMOGLOBIN-HEMACUE: Hemoglobin: 9 g/dL — ABNORMAL LOW (ref 13.0–17.0)

## 2014-08-29 MED ORDER — DARBEPOETIN ALFA 300 MCG/0.6ML IJ SOSY
300.0000 ug | PREFILLED_SYRINGE | Freq: Once | INTRAMUSCULAR | Status: AC
  Administered 2014-08-29: 300 ug via SUBCUTANEOUS

## 2014-08-29 MED ORDER — DARBEPOETIN ALFA 150 MCG/0.3ML IJ SOSY
PREFILLED_SYRINGE | INTRAMUSCULAR | Status: AC
  Filled 2014-08-29: qty 0.6

## 2014-08-29 MED ORDER — DARBEPOETIN ALFA 200 MCG/0.4ML IJ SOSY
200.0000 ug | PREFILLED_SYRINGE | INTRAMUSCULAR | Status: DC
Start: 2014-08-29 — End: 2014-08-30

## 2014-09-19 ENCOUNTER — Encounter (HOSPITAL_COMMUNITY)
Admission: RE | Admit: 2014-09-19 | Discharge: 2014-09-19 | Disposition: A | Discharge status: Home / Self Care | Payer: Medicare Other | Source: Ambulatory Visit | Attending: Oncology | Admitting: Oncology

## 2014-09-19 DIAGNOSIS — D72821 Monocytosis (symptomatic): Secondary | ICD-10-CM | POA: Diagnosis present

## 2014-09-19 DIAGNOSIS — D619 Aplastic anemia, unspecified: Secondary | ICD-10-CM | POA: Insufficient documentation

## 2014-09-19 DIAGNOSIS — D4620 Refractory anemia with excess of blasts, unspecified: Secondary | ICD-10-CM

## 2014-09-19 DIAGNOSIS — D61818 Other pancytopenia: Secondary | ICD-10-CM

## 2014-09-19 DIAGNOSIS — D46Z Other myelodysplastic syndromes: Secondary | ICD-10-CM | POA: Diagnosis present

## 2014-09-19 LAB — POCT HEMOGLOBIN-HEMACUE: Hemoglobin: 9.9 g/dL — ABNORMAL LOW (ref 13.0–17.0)

## 2014-09-19 MED ORDER — DARBEPOETIN ALFA 100 MCG/0.5ML IJ SOSY
PREFILLED_SYRINGE | INTRAMUSCULAR | Status: AC
  Administered 2014-09-19: 100 ug via SUBCUTANEOUS
  Filled 2014-09-19: qty 0.5

## 2014-09-19 MED ORDER — DARBEPOETIN ALFA 200 MCG/0.4ML IJ SOSY
PREFILLED_SYRINGE | INTRAMUSCULAR | Status: AC
  Administered 2014-09-19: 400 ug via SUBCUTANEOUS
  Filled 2014-09-19: qty 0.8

## 2014-09-19 MED ORDER — DARBEPOETIN ALFA 100 MCG/0.5ML IJ SOSY: 500.0000 ug | PREFILLED_SYRINGE | INTRAMUSCULAR | Status: DC

## 2014-10-03 ENCOUNTER — Other Ambulatory Visit: Payer: Self-pay

## 2014-10-11 ENCOUNTER — Encounter (HOSPITAL_COMMUNITY)
Admission: RE | Admit: 2014-10-11 | Discharge: 2014-10-11 | Disposition: A | Discharge status: Home / Self Care | Payer: Medicare Other | Source: Ambulatory Visit | Attending: Oncology | Admitting: Oncology

## 2014-10-11 DIAGNOSIS — D61818 Other pancytopenia: Secondary | ICD-10-CM

## 2014-10-11 DIAGNOSIS — D46Z Other myelodysplastic syndromes: Secondary | ICD-10-CM | POA: Diagnosis present

## 2014-10-11 DIAGNOSIS — D72821 Monocytosis (symptomatic): Secondary | ICD-10-CM | POA: Insufficient documentation

## 2014-10-11 DIAGNOSIS — D619 Aplastic anemia, unspecified: Secondary | ICD-10-CM | POA: Diagnosis present

## 2014-10-11 DIAGNOSIS — D4620 Refractory anemia with excess of blasts, unspecified: Secondary | ICD-10-CM

## 2014-10-11 LAB — POCT HEMOGLOBIN-HEMACUE: Hemoglobin: 9.8 g/dL — ABNORMAL LOW (ref 13.0–17.0)

## 2014-10-11 MED ORDER — DARBEPOETIN ALFA 200 MCG/0.4ML IJ SOSY
PREFILLED_SYRINGE | INTRAMUSCULAR | Status: AC
  Administered 2014-10-11: 400 ug
  Filled 2014-10-11: qty 0.8

## 2014-10-11 MED ORDER — DARBEPOETIN ALFA 100 MCG/0.5ML IJ SOSY
500.0000 ug | PREFILLED_SYRINGE | INTRAMUSCULAR | Status: DC
  Filled 2014-10-11: qty 2.5

## 2014-10-11 MED ORDER — DARBEPOETIN ALFA 100 MCG/0.5ML IJ SOSY
PREFILLED_SYRINGE | INTRAMUSCULAR | Status: AC
  Administered 2014-10-11: 100 ug via SUBCUTANEOUS
  Filled 2014-10-11: qty 0.5

## 2014-10-17 ENCOUNTER — Ambulatory Visit: Payer: Medicare Other | Attending: Geriatric Medicine | Admitting: Physical Therapy

## 2014-10-17 ENCOUNTER — Other Ambulatory Visit (INDEPENDENT_AMBULATORY_CARE_PROVIDER_SITE_OTHER): Payer: Medicare Other

## 2014-10-17 DIAGNOSIS — Z9181 History of falling: Secondary | ICD-10-CM | POA: Diagnosis present

## 2014-10-17 DIAGNOSIS — R6889 Other general symptoms and signs: Secondary | ICD-10-CM | POA: Insufficient documentation

## 2014-10-17 DIAGNOSIS — D472 Monoclonal gammopathy: Secondary | ICD-10-CM

## 2014-10-17 DIAGNOSIS — M545 Low back pain, unspecified: Secondary | ICD-10-CM

## 2014-10-17 DIAGNOSIS — D4620 Refractory anemia with excess of blasts, unspecified: Secondary | ICD-10-CM

## 2014-10-17 DIAGNOSIS — D46Z Other myelodysplastic syndromes: Secondary | ICD-10-CM

## 2014-10-17 DIAGNOSIS — R531 Weakness: Secondary | ICD-10-CM | POA: Insufficient documentation

## 2014-10-17 DIAGNOSIS — R293 Abnormal posture: Secondary | ICD-10-CM | POA: Insufficient documentation

## 2014-10-17 DIAGNOSIS — D72821 Monocytosis (symptomatic): Secondary | ICD-10-CM

## 2014-10-17 DIAGNOSIS — Z7409 Other reduced mobility: Secondary | ICD-10-CM | POA: Insufficient documentation

## 2014-10-17 DIAGNOSIS — D61818 Other pancytopenia: Secondary | ICD-10-CM

## 2014-10-17 LAB — COMPREHENSIVE METABOLIC PANEL
ALK PHOS: 61 U/L (ref 39–117)
ALT: 10 U/L (ref 0–53)
AST: 17 U/L (ref 0–37)
Albumin: 4 g/dL (ref 3.5–5.2)
BUN: 17 mg/dL (ref 6–23)
CO2: 29 mEq/L (ref 19–32)
Calcium: 9.1 mg/dL (ref 8.4–10.5)
Chloride: 106 mEq/L (ref 96–112)
Creat: 0.88 mg/dL (ref 0.50–1.35)
GLUCOSE: 85 mg/dL (ref 70–99)
Potassium: 4.9 mEq/L (ref 3.5–5.3)
SODIUM: 143 meq/L (ref 135–145)
Total Bilirubin: 0.5 mg/dL (ref 0.2–1.2)
Total Protein: 6.6 g/dL (ref 6.0–8.3)

## 2014-10-17 LAB — LACTATE DEHYDROGENASE: LDH: 204 U/L (ref 94–250)

## 2014-10-17 LAB — SAVE SMEAR

## 2014-10-17 LAB — URIC ACID: Uric Acid, Serum: 5.6 mg/dL (ref 4.0–7.8)

## 2014-10-17 NOTE — Patient Instructions (Addendum)
Posture Tips DO: - stand tall and erect - keep chin tucked in - keep head and shoulders in alignment - check posture regularly in mirror or large window - pull head back against headrest in car seat;  Change your position often.  Sit with lumbar support. DON'T: - slouch or slump while watching TV or reading - sit, stand or lie in one position  for too long;  Sitting is especially hard on the spine so if you sit at a desk/use the computer, then stand up often!   Copyright  [employer]. All rights reserved.  Posture - Standing   Good posture is important. Avoid slouching and forward head thrust. Maintain curve in low back and align ears over shoul- ders, hips over ankles.  Pull your belly button in toward your back bone.  Golden thread to the sky from chest. Lift ribs up. Not military. Even weight through toes and heels.  Copyright  VHI. All rights reserved.  Posture - Sitting   Sit upright, head facing forward. Try using a roll to support lower back. Keep shoulders relaxed, and avoid rounded back. Keep hips level with knees. Avoid crossing legs for long periods.. Remember to sit on sit bones(ischial tuberosities) and all the way back into chair.  Avoid sacral sitting.   Be sure to do the quadratus llumborum stretch as shown in clinic  Handout given   Copyright  VHI. All rights reserved.   Voncille Lo, PT 10/17/2014 2:58 PM Phone: [phone] Fax: [phone]

## 2014-10-18 ENCOUNTER — Encounter: Payer: Self-pay | Admitting: Oncology

## 2014-10-18 ENCOUNTER — Ambulatory Visit (INDEPENDENT_AMBULATORY_CARE_PROVIDER_SITE_OTHER): Payer: Medicare Other | Admitting: Oncology

## 2014-10-18 VITALS — BP 148/85 | HR 78 | Temp 98.0°F | Wt 192.8 lb

## 2014-10-18 DIAGNOSIS — I890 Lymphedema, not elsewhere classified: Secondary | ICD-10-CM | POA: Diagnosis not present

## 2014-10-18 DIAGNOSIS — D692 Other nonthrombocytopenic purpura: Secondary | ICD-10-CM

## 2014-10-18 DIAGNOSIS — Z8546 Personal history of malignant neoplasm of prostate: Secondary | ICD-10-CM

## 2014-10-18 DIAGNOSIS — D61818 Other pancytopenia: Secondary | ICD-10-CM

## 2014-10-18 DIAGNOSIS — D72819 Decreased white blood cell count, unspecified: Secondary | ICD-10-CM

## 2014-10-18 DIAGNOSIS — C61 Malignant neoplasm of prostate: Secondary | ICD-10-CM

## 2014-10-18 DIAGNOSIS — R809 Proteinuria, unspecified: Secondary | ICD-10-CM

## 2014-10-18 DIAGNOSIS — R600 Localized edema: Secondary | ICD-10-CM

## 2014-10-18 DIAGNOSIS — R609 Edema, unspecified: Secondary | ICD-10-CM

## 2014-10-18 DIAGNOSIS — D46Z Other myelodysplastic syndromes: Secondary | ICD-10-CM

## 2014-10-18 DIAGNOSIS — D472 Monoclonal gammopathy: Secondary | ICD-10-CM | POA: Diagnosis not present

## 2014-10-18 HISTORY — DX: Other nonthrombocytopenic purpura: D69.2

## 2014-10-18 LAB — CBC WITH DIFFERENTIAL/PLATELET
Basophils Absolute: 0 10*3/uL (ref 0.0–0.1)
Basophils Relative: 0 % (ref 0–1)
EOS ABS: 0 10*3/uL (ref 0.0–0.7)
Eosinophils Relative: 0 % (ref 0–5)
HCT: 30.8 % — ABNORMAL LOW (ref 39.0–52.0)
Hemoglobin: 10.2 g/dL — ABNORMAL LOW (ref 13.0–17.0)
LYMPHS ABS: 1.4 10*3/uL (ref 0.7–4.0)
Lymphocytes Relative: 33 % (ref 12–46)
MCH: 34.7 pg — ABNORMAL HIGH (ref 26.0–34.0)
MCHC: 33.1 g/dL (ref 30.0–36.0)
MCV: 104.8 fL — AB (ref 78.0–100.0)
MONOS PCT: 40 % — AB (ref 3–12)
MPV: 11.8 fL (ref 8.6–12.4)
Monocytes Absolute: 1.7 10*3/uL — ABNORMAL HIGH (ref 0.1–1.0)
NEUTROS PCT: 27 % — AB (ref 43–77)
Neutro Abs: 1.2 10*3/uL — ABNORMAL LOW (ref 1.7–7.7)
Platelets: 156 10*3/uL (ref 150–400)
RBC: 2.94 MIL/uL — ABNORMAL LOW (ref 4.22–5.81)
RDW: 16.3 % — ABNORMAL HIGH (ref 11.5–15.5)
WBC: 4.3 10*3/uL (ref 4.0–10.5)

## 2014-10-18 NOTE — Patient Instructions (Signed)
Continue every 3 week aranesp injections MD visit in November, lab 1 week before visit

## 2014-10-18 NOTE — Therapy (Signed)
[place], [place], [address] Phone: [phone]   Fax:  [phone]  Physical Therapy Evaluation  Patient Details  Name: Joel Macdonald MRN: [id number] Date of Birth: March 24, 1931 Referring Provider:  Lajean Manes, MD  Encounter Date: 10/17/2014      PT End of Session - 10/17/14 1422    Visit Number 1   Number of Visits 16   Date for PT Re-Evaluation 12/12/14   Authorization Type [employer]   Authorization Time Period 12-12-14   Authorization - Visit Number 1   Authorization - Number of Visits 16   PT Start Time 0216   PT Stop Time 0300   PT Time Calculation (min) 44 min   Activity Tolerance Patient tolerated treatment well   Behavior During Therapy [facility] for tasks assessed/performed      Past Medical History  Diagnosis Date  . MGUS (monoclonal gammopathy of unknown significance) 06/17/2011  . Cellulitis of leg, right 07/12/2013  . Ischemia of extremity 07/12/2013    Bilateral toes  2,3,4 right; 1,3 left  Vascular doppler normal for large vessel occlusion 2/15  . Heart murmur 1936  . Pancytopenia, acquired 06/17/2011    "from my Myelodysplastic syndrome"  . GERD (gastroesophageal reflux disease)   . Osteoarthritis     "back, legs, arms, shoulders" (07/12/2013)  . MDS (myelodysplastic syndrome), low grade 09/20/2011    Bone marrow bx 06/13/11: mild dyserythro/dymegakaryopoiesis.  Normal cytogenetics.  FISH negative for chrom 5 or 7 deletions.  Scattered small infiltrates of plasmacytoid lymphs  . Prostate cancer   . Edema, peripheral 03/21/2014  . Monocytosis 03/21/2014  . MDS (myelodysplastic syndrome), high grade 06/13/2014    7% blasts BM bx 05/05/14    Past Surgical History  Procedure Laterality Date  . Tonsillectomy and adenoidectomy  ~ 1939  . Robot assisted laparoscopic radical prostatectomy  ~ 2010  . Cataract extraction w/ intraocular lens  implant, bilateral Bilateral ~ 2012    There were no  vitals filed for this visit.  Visit Diagnosis:  Bilateral low back pain without sciatica  Abnormal posture  Generalized weakness  Activity intolerance  Decreased strength, endurance, and mobility      Subjective Assessment - 10/17/14 1426    Subjective Pt describes pain in all parts of body.  but in back.  Now I have a compression fracture at L-2 . Last year multiple medical problems.  I fell last year and I also assisted my neighbor after falling. and then I fell a second time in an hour    Pertinent History Prostate canncer with robotics surgery post 6 years, anemia , has had 3 transfusion x 3 since December, cellulitis  3 bouts in last year. lymphedema in Right leg   How long can you sit comfortably? unlimited   transitional movements   How long can you stand comfortably? 74min   How long can you walk comfortably? 20- 30 minutes  walking dog   Diagnostic tests MRI, xrays Dopplers   Patient Stated Goals back pain - reduce pain,    Currently in Pain? Yes   Pain Score 4   1/10 at rest   Pain Location Back   Pain Orientation Lower;Mid;Left;Right   Pain Descriptors / Indicators Sharp;Spasm   Pain Type Chronic pain   Pain Onset More than a month ago   Pain Frequency Intermittent   Aggravating Factors  getting up from sitting   Pain Relieving Factors not much of anything, medication  Effect of Pain on Daily Activities difficulty getting up and down from chair            [facility] PT Assessment - 10/17/14 1439    Assessment   Medical Diagnosis low back pain   Onset Date/Surgical Date 05/19/14   Hand Dominance Right   Prior Therapy none   Precautions   Precautions Fall  Pt had one incident in last 6 mo of falling 2x in 1 hour    Restrictions   Weight Bearing Restrictions No   Balance Screen   Has the patient fallen in the past 6 months Yes   How many times? 2  in same hour in Feb 2016   Has the patient had a decrease in activity level because of a fear of falling?  No    Is the patient reluctant to leave their home because of a fear of falling?  No   Home Environment   Living Environment Private residence   Living Arrangements Spouse/significant other   Type of [place] to enter   Entrance Stairs-Number of Steps 1   [place] Two level   Prior Function   Level of [place] Retired  former Pediatrician/VP at [employer]   Overall Cognitive Status Within Functional Limits for tasks assessed   Observation/Other Assessments   Focus on Therapeutic Outcomes (FOTO)  intake 58% liimitation 42% predeicted 35%   Posture/Postural Control   Posture/Postural Control Postural limitations   Postural Limitations Rounded Shoulders;Forward head   Posture Comments anterior lean of pelvis , deactivated gluts left pelvis elevated more than Right    AROM   Lumbar Flexion 50   Lumbar Extension 14   Lumbar - Right Side Bend 20   Lumbar - Left Side Bend 12  ERP   Lumbar - Right Rotation 50%   Lumbar - Left Rotation 50%   Strength   Overall Strength Deficits   Overall Strength Comments abdominal 2/5   Right Hip Flexion 3-/5   Right Hip ABduction 3-/5   Left Hip Flexion 3-/5   Left Hip ABduction 3-/5   Right Knee Flexion 4-/5   Right Knee Extension 4-/5   Left Knee Flexion 4-/5   Left Knee Extension 4-/5   Flexibility   Hamstrings tightened bil   ITB tightened bil   Piriformis tightened L > R   Palpation   Palpation comment Pt with palpable tenderness over Left PSIS and L Quadratus Lumborum, diffus tenderness over lumbar paraspinals bil   Ambulation/Gait   Ambulation/Gait Yes   Ambulation/Gait Assistance 7: Independent   Ambulation Distance (Feet) 100 Feet   Assistive device None   Gait Pattern Trendelenburg;Decreased trunk rotation;Decreased hip/knee flexion - right;Decreased hip/knee flexion - left;Decreased stride length;Decreased arm swing - right;Decreased arm swing - left  Left greater than R    Ambulation Surface Level   Gait velocity 3.11 ft/sec   Gait Comments Pt with generalized weakness , core and more proximally                   [facility] Adult PT Treatment/Exercise - 10/18/14 0001    Lumbar Exercises: Sidelying   Other Sidelying Lumbar Exercises Right sidelying Quadratus lumborum stretch for  3 min                PT Education - 10/17/14 1450    Education provided Yes   Education Details Explanation of findings and iinitial posture and body  mechaniics as well as handout for Quadratus Lumborum stretch   Person(s) Educated Patient   Methods Explanation;Demonstration;Verbal cues;Handout   Comprehension Verbalized understanding;Returned demonstration          PT Short Term Goals - 10/17/14 1423    PT SHORT TERM GOAL #1   Title Pt will be independent with Initial HEP   Time 4   Period Weeks   Status New   PT SHORT TERM GOAL #2   Title "Demonstrate understanding of proper sitting posture and be more conscious of position and posture throughout the day.    Time 4   Period Weeks   Status New   PT SHORT TERM GOAL #3   Title "Report pain decrease from 4 /10 to 2 /10. with functional activities   Time 4   Period Weeks   Status New   PT SHORT TERM GOAL #4   Title Assess balance using BERG balance scale and set a LTG   Time 4   Period Weeks   Status New           PT Long Term Goals - 10/17/14 1425    PT LONG TERM GOAL #1   Title "Demonstrate and verbalize techniques to reduce the risk of re-injury including: lifting, posture, body mechanics especially for protection of compression fx   Time 8   Period Weeks   Status New   PT LONG TERM GOAL #2   Title "FOTO will improve from   42% limitation to  35%   indicating improved functional mobility.    Time 8   Period Weeks   Status New   PT LONG TERM GOAL #3   Title Pt will be independent with advance HEP for Core and LE strength   Time 8   Period Weeks   Status New   PT LONG TERM GOAL #4    Title Pt will be 0/10 with all functional activity and transitional movements   Time 8   Period Weeks   Status New   PT LONG TERM GOAL #5   Title Pt will be at least 4 to 4+/5 in LE MMT to reduce risk of falls on level surfaces and negotiating stairs   Time 8   Period Weeks   Status New   Additional Long Term Goals   Additional Long Term Goals Yes   PT LONG TERM GOAL #6   Title Pt will tolerate standing without exacerbation of pain for 1 hour in order to shop for groceries   Time 8   Period Weeks   Status New   PT LONG TERM GOAL #7   Title Pt will demonstrate 15  sit to stand with minimal use of hands in order to demonstrate ability to execute transitional movements without aggravating spinal pain.   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  Pt also reports having fallen twice in one hour  in last 6 months and notes generalized weakness this year.  He  also cares for his wife who has more weakness than himself and he  would like to improve  his strength and decrease risk for falls.  Pt shows impairments such as 2/5 abdominal weakness and  3-/5 to 4-/5 MMT for LE's, hx of recent falls due to weakness.  Pt also shows definitel use of hands needed to rise from chair.  Pt exhibits postural dysfunction with sacral sitting and anterior lean with flexed/forward head and shouldeers.  Pt would benefit from skilled PT to reduce falls risk, improve strength. Pt with Left elevated pelvis and tightened quadratus lumborum bil but Left > than R.  Pt also with decreased flexibility of hamstrings, hip flexors, piriformis bil.  Posture and body  mechanics to reduce risk of  compression fractures.    Pt will benefit from skilled therapeutic intervention in order to improve on the following deficits Abnormal gait;Decreased activity tolerance;Decreased balance;Decreased mobility;Decreased range of motion;Decreased strength;Hypomobility;Difficulty walking;Increased fascial restricitons;Increased muscle spasms;Impaired flexibility;Postural dysfunction;Improper body mechanics;Pain   Rehab Potential Good   PT Frequency 2x / week   PT Duration 8 weeks   PT Treatment/Interventions ADLs/Self Care Home Management;Cryotherapy;Electrical Stimulation;Stair training;Gait training;Moist Heat;Functional mobility training;Therapeutic exercise;Balance training;Neuromuscular re-education;Manual techniques;Dry needling;Patient/family education;Passive range of motion;Taping   PT Next Visit Plan Begin pt with hamstring/flexor stretch and initial core Transversus abdominis/  Please do BERG balance for goal setting.   PT Home Exercise Plan Right side lying quadratus lumborum stretch and Posture/standing initial education   Consulted and Agree with Plan of Care Patient          G-Codes - 10/19/14 1914    Functional Assessment Tool Used FOTO   Functional Limitation Changing and maintaining body position   Changing and Maintaining Body Position Current Status [phone]) At least 40 percent but less than 60 percent impaired, limited or restricted   Changing and Maintaining Body Position Goal Status ([id number]) At least 20 percent but less than 40 percent impaired, limited or restricted       Problem List Patient Active Problem List   Diagnosis Date Noted  . MDS (myelodysplastic syndrome), high grade 06/13/2014  . Edema, peripheral 03/21/2014  . Monocytosis 03/21/2014  . Peripheral vascular disease, unspecified 08/03/2013  . Vasculitis 08/02/2013  . Cellulitis of leg, right 07/12/2013  . Ischemia of extremity 07/12/2013  . Cellulitis of leg, left 07/12/2013   . Cellulitis and abscess of leg 07/12/2013  . Compression fracture of T12 vertebra 10/08/2011  . MDS (myelodysplastic syndrome), low grade 09/20/2011  . Pancytopenia, acquired 06/17/2011  . MGUS (monoclonal gammopathy of unknown significance) 06/17/2011  . HIP PAIN, BILATERAL 11/07/2009  . COMPRESSION FRACTURE, THORACIC VERTEBRA 11/07/2009  . ADENOCARCINOMA, PROSTATE, GLEASON GRADE 7 10/17/2009  . BACK PAIN, LUMBAR 10/17/2009  . Sciatica 10/17/2009  . SKIN CANCER 06/05/2006  . HYPERTENSION, BENIGN SYSTEMIC 06/05/2006  . RHINITIS, ALLERGIC 06/05/2006  . REFLUX ESOPHAGITIS 06/05/2006   Voncille Lo, PT 10/18/2014 7:35 AM Phone: [phone] Fax: [place], [place], [address] Phone: [phone]   Fax:  [phone]

## 2014-10-18 NOTE — Progress Notes (Signed)
Patient ID: Joel Macdonald, male   DOB: 30-Oct-1930, 81 y.o.   MRN: [id number] Hematology and Oncology Follow Up Visit  FINTAN GRATER [id number] 1930/12/20 81 y.o. 10/18/2014 6:04 PM   Principle Diagnosis: Encounter Diagnoses  Name Primary?  . Pancytopenia, acquired   . MDS (myelodysplastic syndrome), high grade Yes  . ADENOCARCINOMA, PROSTATE, GLEASON GRADE 7   . Edema, peripheral   . 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 Bone marrow biopsy done 06/13/2011 showed changes of myelodysplasia with dyserythropoiesis and dysmegakaryopoiesis. There were some focal plasmacytoid infiltrates. He did have a borderline elevation of his serum IgM. There were no excess blasts. Cytogenetic studies were normal. Overall he was doing well until one year ago. He had a flulike illness in December 2014 with a persistent cough not responding to antibacterial antibiotics and the presence of oral ulcerations typical of a virus infection. He subsequently began to develop first right leg then left leg cellulitis and swelling. Over the next few months, he had a number of venous and arterial Doppler studies which failed to show any evidence for thrombosis. He was treated with multiple courses of antibiotics with minimal response and persistent, and progressive, brawny edema of his lower extremities. MRI of his right leg did not show any evidence for osteomyelitis. A routine urinalysis showed no protein and serum albumin initially normal with most recent value slightly decreased at 3.3 g. His performance status  declined over the same interval with increasing malaise, and anorexia.  His internist began a trial of high-dose diuretics . Iinitially he lost some weight. An echocardiogram was ordered to assess possibility of clot in his IVC. This  showed a normal left ventricular ejection fraction. No significant valvular abnormalities. Normal flow in the IVC with no evidence of clot.  Incidentally noted on a follow-up venous Doppler study done on December 11 was right inguinal adenopathy. This was followed up with a CT scan of the abdomen and pelvis done on 03/25/2014 which showed borderline bilateral iliac chain adenopathy with largest node on the right side measuring 1.8 cm largest retroperitoneal node 0.9 cm. Spleen was not enlarged but did have some calcified granulomas. I did not feel that this degree of adenopathy was sufficient to explain his significant bilateral lower extremity edema. I obtained a 24 hour urine collection which did show  proteinuria of 1 g. Serum albumin borderline decreased at 3.5 g. Renal function was normal until he began high-dose diuretics. Diuretics have been tapered & stopped. Renal function back to normal.  I was  concerned that he developed some form of vasculitis when he developed ischemic changes of the digits of his toes bilaterally at time of my April 2015 exam. ESR elevated at 49 mm. Cryoglobulins not detected. ANCA screen negative.   I repeated a bone marrow aspiration and biopsy on 05/05/14 in view of sudden progression of anemia and increase in peripheral blood monocytes up to 50% of the differential. The bone marrow shows that his myelodysplastic syndrome has progressed. Blast count 7% of the differential by light microscopy although a distinct increased blast population not seen on flow cytometry. There is trilineage dysplasia. Minimal plasmacytosis unchanged from previous marrow. Cytogenetic studies have returned showing no gross abnormalities including FISH studies to look for deletions and chromosomes 5 and 7. In view of his poor performance status and other comorbid medical  conditions, I did not feel that he was a candidate for chemotherapy with a hypomethylating agent such as 5-azacytidine or  decitabine. I have elected to start him on Aranesp to see if we could get a red cell response since he has now become transfusion dependent for red cells. Aranesp 300 g every 3 week was started on 05/16/2014.  Interim History:   I'm pleased to report we are seeing some global stabilization of many of his problems. The Aranesp has been successful and his hemoglobin has come up as high as 10.2. White count is stable as is percentage of monocytes. Platelets remain in the normal range. He is eating better and his albumin has come up to 4.0 g percent. This plus having a higher hemoglobin has significantly helped his peripheral edema. He is off all his diuretics. He is wearing compression hose which has also helped. 1 interim flare of lower extremity cellulitis treated with a course of oral antibiotics by his internist. No other infections.   Medications: reviewed  Allergies:  Allergies  Allergen Reactions  . Nsaids Other (See Comments)  . Other Other (See Comments)    Ragweed causes nasal congestion    Review of Systems: See HPI Remaining ROS negative:   Physical Exam: Blood pressure 148/85, pulse 78, temperature 98 F (36.7 C), temperature source Oral, weight 192 lb 12.8 oz (87.454 kg), SpO2 100 %. Wt Readings from Last 3 Encounters:  10/18/14 192 lb 12.8 oz (87.454 kg)  08/16/14 190 lb 8 oz (86.41 kg)  06/13/14 189 lb 3.2 oz (85.821 kg)     General appearance: Improved nutrition for this Caucasian man HENNT: Pharynx no erythema, exudate, mass, or ulcer. No thyromegaly or thyroid nodules Lymph nodes: No cervical, supraclavicular, or axillary lymphadenopathy Breasts:  Lungs: Clear to auscultation, resonant to percussion throughout Heart: Regular rhythm, no murmur, no gallop, no rub, no click, no edema Abdomen: Soft, nontender, normal bowel sounds, no mass, no organomegaly Extremities: Improved but persistent asymmetric edema, 2+ on the right, 1+ on the left. Resolution of previous  areas of recurrent cellulitis. no calf tenderness Musculoskeletal: no joint deformities GU:  Vascular: Carotid pulses 2+, no bruits, Neurologic: Alert, oriented, PERRLA,  cranial nerves grossly normal, motor strength 5 over 5, reflexes 1+ symmetric, upper body coordination normal, gait normal, Skin: No rash. Multiple ecchymosis on the skin of his arms bilaterally.  Lab Results: CBC W/Diff    Component Value Date/Time   WBC 4.3 10/17/2014 1101   WBC 3.0* 05/17/2013 1258   RBC 2.94* 10/17/2014 1101   RBC 3.13* 06/13/2014 1022   RBC 3.01* 05/17/2013 1258   HGB 10.2* 10/17/2014 1101   HGB 10.4* 05/17/2013 1258   HCT 30.8* 10/17/2014 1101   HCT 31.5* 05/17/2013 1258   PLT 156 10/17/2014 1101   PLT 84* 05/17/2013 1258   MCV 104.8* 10/17/2014 1101   MCV 104.7* 05/17/2013 1258   MCH 34.7* 10/17/2014 1101   MCH 34.6* 05/17/2013 1258   MCHC 33.1 10/17/2014 1101   MCHC 33.0 05/17/2013 1258   RDW 16.3* 10/17/2014 1101   RDW 16.0* 05/17/2013 1258   LYMPHSABS 1.4 10/17/2014 1101   LYMPHSABS 1.5 05/17/2013 1258   MONOABS 1.7* 10/17/2014 1101   MONOABS 0.8 05/17/2013 1258   EOSABS 0.0 10/17/2014 1101   EOSABS 0.0 05/17/2013 1258   BASOSABS 0.0 10/17/2014 1101   BASOSABS 0.0 05/17/2013 1258     Chemistry      Component Value Date/Time   NA 143 10/17/2014 1101  NA 139 11/09/2012 1425   K 4.9 10/17/2014 1101   K 4.2 11/09/2012 1425   CL 106 10/17/2014 1101   CO2 29 10/17/2014 1101   CO2 24 11/09/2012 1425   BUN 17 10/17/2014 1101   BUN 22.4 11/09/2012 1425   CREATININE 0.88 10/17/2014 1101   CREATININE 1.39* 03/23/2014 0730   CREATININE 0.95 07/12/2013 2036   CREATININE 1.2 11/09/2012 1425      Component Value Date/Time   CALCIUM 9.1 10/17/2014 1101   CALCIUM 9.3 11/09/2012 1425   ALKPHOS 61 10/17/2014 1101   ALKPHOS 62 11/09/2012 1425   AST 17 10/17/2014 1101   AST 20 11/09/2012 1425   ALT 10 10/17/2014 1101   ALT 19 11/09/2012 1425   BILITOT 0.5 10/17/2014 1101    BILITOT 0.57 11/09/2012 1425       Radiological Studies: No results found.  Impression:  #1. Progression from low risk myelodysplastic syndrome diagnosed in February 2013 to high risk MDS as of bone marrow biopsy 05/05/2014.  Trial of Aranesp started in February is working.. Hemoglobin as high as 10.2 g and he has now returned to transfusion independency. . Continue Aranesp and continue to monitor blood counts closely.We again discussed the unstable nature of his blood disorder and risk for progression to acute leukemia. He understands that he is not a candidate for a bone marrow transplant.  #2. Idiopathic lymphedema, proteinuria, progressive anemia, low-grade, nonbulky lymphadenopathy without splenomegaly. Improved with better nutrition, rise in albumin, and rise in hemoglobin.  #3. IgM monoclonal gammopathy of undetermined significance-no change over time. IgM level at baseline  #4. History of prostate cancer treated with primary surgery PSA remains undetectable as of most recent assay done 05/05/2014  #5. Recurrent lower extremity cellulitis secondary to edema and decreased white blood cell function from MDS. Stable at time of today's exam with no new areas of concern.  CC: Patient Care Team: Lajean Manes, MD as PCP - General (Internal Medicine) Annia Belt, MD as Consulting Physician (Oncology)   Annia Belt, MD 7/12/20166:04 PM

## 2014-10-25 ENCOUNTER — Ambulatory Visit: Payer: Medicare Other | Admitting: Physical Therapy

## 2014-10-25 DIAGNOSIS — R6889 Other general symptoms and signs: Secondary | ICD-10-CM

## 2014-10-25 DIAGNOSIS — M545 Low back pain, unspecified: Secondary | ICD-10-CM

## 2014-10-25 DIAGNOSIS — Z7409 Other reduced mobility: Secondary | ICD-10-CM

## 2014-10-25 DIAGNOSIS — R531 Weakness: Secondary | ICD-10-CM

## 2014-10-25 DIAGNOSIS — R293 Abnormal posture: Secondary | ICD-10-CM

## 2014-10-25 DIAGNOSIS — Z9181 History of falling: Secondary | ICD-10-CM

## 2014-10-25 NOTE — Patient Instructions (Signed)
Supine Hamstring Stretch   Lift one leg, placing hands behind thigh. Gently pull leg toward torso until a stretch is felt. Other leg is bent and back is flat against floor.  HOLD 30 secondsRepeat with other leg. Repeat __3__ times. Do __2__ sessions per day.  http://[email or web address].exer.us/828   Copyright  VHI. All rights reserved.  Hip Flexor Stretch   Lying on back near edge of bed, bend one leg, foot flat. Hang other leg over edge, relaxed, thigh resting off bed  __2__ minutes. . Do 2-3____ sessions per day. PULL OTHER KNEE UP TO CHEST AS YOU DANGLE LEG OFF BED   http://gt2.exer.us/346   Copyright  VHI. All rights reserved.    PELVIC TILT  Lie on back, legs bent. Exhale, tilting top of pelvis back, pubic bone up, to flatten lower back. Inhale, rolling pelvis opposite way, top forward, pubic bone down, arch in back. Repeat __10__ times. Do __2__ sessions per day. Copyright  VHI. All rights reserved.    Isometric Hold With Pelvic Floor (Hook-Lying)  Lie with hips and knees bent. Slowly inhale, and then exhale. Pull navel toward spine and tighten pelvic floor. Hold for __10_ seconds. Continue to breathe in and out during hold. Rest for _10__ seconds. Repeat __10_ times. Do __2-3_ times a day.   Knee Fold  Lie on back, legs bent, arms by sides. Exhale, lifting knee to chest. Inhale, returning. Keep abdominals flat, navel to spine. Repeat __10__ times, alternating legs. Do __2__ sessions per day.  Knee Drop  Keep pelvis stable. Without rotating hips, slowly drop knee to side, pause, return to center, bring knee across midline toward opposite hip. Feel obliques engaging. Repeat for ___10_ times each leg.   Copyright  VHI. All rights reserved.       Heel Slide to Straight   Slide one leg down to straight. Return. Be sure pelvis does not rock forward, tilt, rotate, or tip to side. Do _10__ times. Restabilize pelvis. Repeat with other leg. Do __1-2_ sets, __2_ times per  day.  http://ss.exer.us/16   Copyright  VHI. All rights reserved.

## 2014-10-25 NOTE — Therapy (Signed)
[place], [place], [address] Phone: [phone]   Fax:  [phone]  Physical Therapy Treatment  Patient Details  Name: Joel Macdonald MRN: [id number] Date of Birth: [birth year] Referring Provider:  Lajean Manes, MD  Encounter Date: 10/25/2014      PT End of Session - 10/25/14 1150    Visit Number 2   Number of Visits 16   Date for PT Re-Evaluation 12/12/14   PT Start Time 4580   PT Stop Time 1245   PT Time Calculation (min) 60 min      Past Medical History  Diagnosis Date  . MGUS (monoclonal gammopathy of unknown significance) 06/17/2011  . Cellulitis of leg, right 07/12/2013  . Ischemia of extremity 07/12/2013    Bilateral toes  2,3,4 right; 1,3 left  Vascular doppler normal for large vessel occlusion 2/15  . Heart murmur 1936  . Pancytopenia, acquired 06/17/2011    "from my Myelodysplastic syndrome"  . GERD (gastroesophageal reflux disease)   . Osteoarthritis     "back, legs, arms, shoulders" (07/12/2013)  . MDS (myelodysplastic syndrome), low grade 09/20/2011    Bone marrow bx 06/13/11: mild dyserythro/dymegakaryopoiesis.  Normal cytogenetics.  FISH negative for chrom 5 or 7 deletions.  Scattered small infiltrates of plasmacytoid lymphs  . Prostate cancer   . Edema, peripheral 03/21/2014  . Monocytosis 03/21/2014  . MDS (myelodysplastic syndrome), high grade 06/13/2014    7% blasts BM bx 05/05/14  . Senile purpura 10/18/2014    Past Surgical History  Procedure Laterality Date  . Tonsillectomy and adenoidectomy  ~ 1939  . Robot assisted laparoscopic radical prostatectomy  ~ 2010  . Cataract extraction w/ intraocular lens  implant, bilateral Bilateral ~ 2012    There were no vitals filed for this visit.  Visit Diagnosis:  Bilateral low back pain without sciatica  Abnormal posture  Generalized weakness  Activity intolerance  Decreased strength, endurance, and mobility  At risk for falls      Subjective Assessment - 10/25/14 1147    Subjective My pain is worse at night, up to 6-7/10. My pain has seems to be worse since my first visit but nothing that. I am having a recurrent infection in tight foot.  I am on antibiotics. I also have parasthesia in my right foot..   Currently in Pain? Yes   Pain Score 2    Pain Location Back   Pain Orientation Mid;Lower   Pain Descriptors / Indicators Aching   Pain Type Chronic pain   Aggravating Factors  standing to cook, trying to sleep   Pain Relieving Factors hot shower            [facility] PT Assessment - 10/25/14 1150    Standardized Balance Assessment   Standardized Balance Assessment Berg Balance Test   Berg Balance Test   Sit to Stand Able to stand without using hands and stabilize independently   Standing Unsupported Able to stand safely 2 minutes   Sitting with Back Unsupported but Feet Supported on Floor or Stool Able to sit safely and securely 2 minutes   Stand to Sit Sits safely with minimal use of hands   Transfers Able to transfer safely, minor use of hands   Standing Unsupported with Eyes Closed Able to stand 10 seconds safely   Standing Ubsupported with Feet Together Able to place feet together independently and stand 1 minute safely   From Standing, Reach Forward with Outstretched Arm Can reach confidently >25  cm (10")   From Standing Position, Pick up Object from [place] to pick up shoe safely and easily   From Standing Position, Turn to Look Behind Over each Shoulder Looks behind from both sides and weight shifts well   Turn 360 Degrees Able to turn 360 degrees safely in 4 seconds or less   Standing Unsupported, Alternately Place Feet on Step/Stool Able to stand independently and safely and complete 8 steps in 20 seconds   Standing Unsupported, One Foot in [place] to plae foot ahead of the other independently and hold 30 seconds   Standing on One Leg Tries to lift leg/unable to hold 3 seconds but remains  standing independently   Total Score 52                     [facility] Adult PT Treatment/Exercise - 10/25/14 1202    Lumbar Exercises: Stretches   Active Hamstring Stretch 3 reps;30 seconds   Lower Trunk Rotation 5 reps   Lower Trunk Rotation Limitations each way with opposite head turns   Hip Flexor Stretch 3 reps;30 seconds  off edge of bed   Lumbar Exercises: Supine   Ab Set 5 reps   Clam 10 reps  cues to maintain neutral   Heel Slides 10 reps   Heel Slides Limitations cues to maintain neutral   Bent Knee Raise 10 reps   Bent Knee Raise Limitations cues to maintain neutral   Lumbar Exercises: Sidelying   Other Sidelying Lumbar Exercises Right sidelying Quadratus lumborum stretch for  3 min   Modalities   Modalities Moist Heat   Moist Heat Therapy   Number Minutes Moist Heat 15 Minutes   Moist Heat Location Lumbar Spine                PT Education - 10/25/14 1227    Education provided Yes   Education Details Pre pilates, hip flexor stretch and hamstring stretch   Person(s) Educated Patient   Methods Explanation;Handout   Comprehension Verbalized understanding          PT Short Term Goals - 10/17/14 1423    PT SHORT TERM GOAL #1   Title Pt will be independent with Initial HEP   Time 4   Period Weeks   Status New   PT SHORT TERM GOAL #2   Title "Demonstrate understanding of proper sitting posture and be more conscious of position and posture throughout the day.    Time 4   Period Weeks   Status New   PT SHORT TERM GOAL #3   Title "Report pain decrease from 4 /10 to 2 /10. with functional activities   Time 4   Period Weeks   Status New   PT SHORT TERM GOAL #4   Title Assess balance using BERG balance scale and set a LTG   Time 4   Period Weeks   Status New           PT Long Term Goals - 10/17/14 1425    PT LONG TERM GOAL #1   Title "Demonstrate and verbalize techniques to reduce the risk of re-injury including: lifting, posture,  body mechanics especially for protection of compression fx   Time 8   Period Weeks   Status New   PT LONG TERM GOAL #2   Title "FOTO will improve from   42% limitation to  35%   indicating improved functional mobility.    Time 8   Period Weeks  Status New   PT LONG TERM GOAL #3   Title Pt will be independent with advance HEP for Core and LE strength   Time 8   Period Weeks   Status New   PT LONG TERM GOAL #4   Title Pt will be 0/10 with all functional activity and transitional movements   Time 8   Period Weeks   Status New   PT LONG TERM GOAL #5   Title Pt will be at least 4 to 4+/5 in LE MMT to reduce risk of falls on level surfaces and negotiating stairs   Time 8   Period Weeks   Status New   Additional Long Term Goals   Additional Long Term Goals Yes   PT LONG TERM GOAL #6   Title Pt will tolerate standing without exacerbation of pain for 1 hour in order to shop for groceries   Time 8   Period Weeks   Status New   PT LONG TERM GOAL #7   Title Pt will demonstrate 15  sit to stand with minimal use of hands in order to demonstrate ability to execute transitional movements without aggravating spinal pain.   Time 8   Period Weeks   Status New               Plan - 10/25/14 1238    Clinical Impression Statement Merrilee Jansky 52/56. Instructed pt in hamstring and hip flexor stretch as well as review of sidelying QL stretch. Began basic prepilates Transverse abdominal training. Pt did well with this and admits to pelvic floor PT after prostatectomy. Added to HEP. Mosit heat after therex to decrease spasm.    PT Next Visit Plan review HEP. Dynamic gait index? moist heat        Problem List Patient Active Problem List   Diagnosis Date Noted  . Senile purpura 10/18/2014  . MDS (myelodysplastic syndrome), high grade 06/13/2014  . Edema, peripheral 03/21/2014  . Monocytosis 03/21/2014  . Peripheral vascular disease, unspecified 08/03/2013  . Vasculitis 08/02/2013  .  Cellulitis of leg, right 07/12/2013  . Ischemia of extremity 07/12/2013  . Cellulitis of leg, left 07/12/2013  . Cellulitis and abscess of leg 07/12/2013  . Compression fracture of T12 vertebra 10/08/2011  . MDS (myelodysplastic syndrome), low grade 09/20/2011  . Pancytopenia, acquired 06/17/2011  . MGUS (monoclonal gammopathy of unknown significance) 06/17/2011  . HIP PAIN, BILATERAL 11/07/2009  . COMPRESSION FRACTURE, THORACIC VERTEBRA 11/07/2009  . ADENOCARCINOMA, PROSTATE, GLEASON GRADE 7 10/17/2009  . BACK PAIN, LUMBAR 10/17/2009  . Sciatica 10/17/2009  . SKIN CANCER 06/05/2006  . HYPERTENSION, BENIGN SYSTEMIC 06/05/2006  . RHINITIS, ALLERGIC 06/05/2006  . REFLUX ESOPHAGITIS 06/05/2006    Dorene Ar, PTA 10/25/2014, 12:42 PM  [facility] [address] [place], [place], [address] Phone: [phone]   Fax:  [phone]

## 2014-10-27 ENCOUNTER — Ambulatory Visit: Payer: Medicare Other | Admitting: Physical Therapy

## 2014-10-27 DIAGNOSIS — R531 Weakness: Secondary | ICD-10-CM

## 2014-10-27 DIAGNOSIS — Z7409 Other reduced mobility: Secondary | ICD-10-CM

## 2014-10-27 DIAGNOSIS — M545 Low back pain, unspecified: Secondary | ICD-10-CM

## 2014-10-27 DIAGNOSIS — R293 Abnormal posture: Secondary | ICD-10-CM

## 2014-10-27 DIAGNOSIS — Z9181 History of falling: Secondary | ICD-10-CM

## 2014-10-27 DIAGNOSIS — R6889 Other general symptoms and signs: Secondary | ICD-10-CM

## 2014-10-27 NOTE — Therapy (Addendum)
[place], [place], [address] Phone: [phone]   Fax:  [phone]  Physical Therapy Treatment  Patient Details  Name: Joel Macdonald MRN: [id number] Date of Birth: 1931-03-11 Referring Provider:  Lajean Manes, MD  Encounter Date: 10/27/2014      PT End of Session - 10/27/14 1455    Visit Number 3   Number of Visits 16   Date for PT Re-Evaluation 12/12/14   PT Start Time 2423   PT Stop Time 1230   PT Time Calculation (min) 45 min   Activity Tolerance Patient tolerated treatment well   Behavior During Therapy [facility] for tasks assessed/performed      Past Medical History  Diagnosis Date  . MGUS (monoclonal gammopathy of unknown significance) 06/17/2011  . Cellulitis of leg, right 07/12/2013  . Ischemia of extremity 07/12/2013    Bilateral toes  2,3,4 right; 1,3 left  Vascular doppler normal for large vessel occlusion 2/15  . Heart murmur 1936  . Pancytopenia, acquired 06/17/2011    "from my Myelodysplastic syndrome"  . GERD (gastroesophageal reflux disease)   . Osteoarthritis     "back, legs, arms, shoulders" (07/12/2013)  . MDS (myelodysplastic syndrome), low grade 09/20/2011    Bone marrow bx 06/13/11: mild dyserythro/dymegakaryopoiesis.  Normal cytogenetics.  FISH negative for chrom 5 or 7 deletions.  Scattered small infiltrates of plasmacytoid lymphs  . Prostate cancer   . Edema, peripheral 03/21/2014  . Monocytosis 03/21/2014  . MDS (myelodysplastic syndrome), high grade 06/13/2014    7% blasts BM bx 05/05/14  . Senile purpura 10/18/2014    Past Surgical History  Procedure Laterality Date  . Tonsillectomy and adenoidectomy  ~ 1939  . Robot assisted laparoscopic radical prostatectomy  ~ 2010  . Cataract extraction w/ intraocular lens  implant, bilateral Bilateral ~ 2012    There were no vitals filed for this visit.  Visit Diagnosis:  Bilateral low back pain without sciatica  Abnormal  posture  Generalized weakness  Activity intolerance  Decreased strength, endurance, and mobility  At risk for falls      Subjective Assessment - 10/27/14 1150    Subjective all muscles have hurt all over after doing exercises and not slept well; not a good day   Currently in Pain? Yes   Pain Score 5    Pain Location Back   Pain Orientation Lower   Multiple Pain Sites Yes  achy all over the body; arms, hands, legs, back, core                         [facility] Adult PT Treatment/Exercise - 10/27/14 1444    Lumbar Exercises: Stretches   Active Hamstring Stretch 3 reps;30 seconds   Single Knee to Chest Stretch 3 reps;30 seconds   Lower Trunk Rotation 3 reps;30 seconds   Lower Trunk Rotation Limitations plus upper trunk rotation (book openings) 3 reps; 30 seconds    Lumbar Exercises: Seated   Other Seated Lumbar Exercises ab set seated with 10 reps of clams and 10 reps of marches   Other Seated Lumbar Exercises QL stretch; modified childs pose with R lean; seated with UE on rolling stool   Lumbar Exercises: Supine   Ab Set 10 reps;3 seconds   AB Set Limitations 10 reps with clam; 10 reps with heel slides; 10 reps with bent knee raise   Moist Heat Therapy   Number Minutes Moist Heat 5 Minutes   Moist  Heat Location Lumbar Spine                PT Education - 10/27/14 1455    Education provided Yes   Education Details at home heating packs    Person(s) Educated Patient   Methods Explanation;Handout   Comprehension Verbalized understanding          PT Short Term Goals - 10/17/14 1423    PT SHORT TERM GOAL #1   Title Pt will be independent with Initial HEP   Time 4   Period Weeks   Status New   PT SHORT TERM GOAL #2   Title "Demonstrate understanding of proper sitting posture and be more conscious of position and posture throughout the day.    Time 4   Period Weeks   Status New   PT SHORT TERM GOAL #3   Title "Report pain decrease from 4 /10 to  2 /10. with functional activities   Time 4   Period Weeks   Status New   PT SHORT TERM GOAL #4   Title Assess balance using BERG balance scale and set a LTG   Time 4   Period Weeks   Status New           PT Long Term Goals - 10/17/14 1425    PT LONG TERM GOAL #1   Title "Demonstrate and verbalize techniques to reduce the risk of re-injury including: lifting, posture, body mechanics especially for protection of compression fx   Time 8   Period Weeks   Status New   PT LONG TERM GOAL #2   Title "FOTO will improve from   42% limitation to  35%   indicating improved functional mobility.    Time 8   Period Weeks   Status New   PT LONG TERM GOAL #3   Title Pt will be independent with advance HEP for Core and LE strength   Time 8   Period Weeks   Status New   PT LONG TERM GOAL #4   Title Pt will be 0/10 with all functional activity and transitional movements   Time 8   Period Weeks   Status New   PT LONG TERM GOAL #5   Title Pt will be at least 4 to 4+/5 in LE MMT to reduce risk of falls on level surfaces and negotiating stairs   Time 8   Period Weeks   Status New   Additional Long Term Goals   Additional Long Term Goals Yes   PT LONG TERM GOAL #6   Title Pt will tolerate standing without exacerbation of pain for 1 hour in order to shop for groceries   Time 8   Period Weeks   Status New   PT LONG TERM GOAL #7   Title Pt will demonstrate 15  sit to stand with minimal use of hands in order to demonstrate ability to execute transitional movements without aggravating spinal pain.   Time 8   Period Weeks   Status New               Plan - 10/27/14 1457    Clinical Impression Statement pt. presented c/o all over soreness after exercises; pt. felt modified childs pose with lateral bend more beneficial than seated QL stretch; attempted seated ab set progressions to reduce soreness after exercise; pt. verbalized understanding of at home heat precautions   PT Next Visit  Plan assess new QL stretch and seated ab set progressions; continue with  lumbar stretches and core strengthening; moist heat as needed - assess at home heating and  HEP    Consulted and Agree with Plan of Care Patient     During this treatment session, the therapist was present, participating in and directing the treatment.   Problem List Patient Active Problem List   Diagnosis Date Noted  . Senile purpura 10/18/2014  . MDS (myelodysplastic syndrome), high grade 06/13/2014  . Edema, peripheral 03/21/2014  . Monocytosis 03/21/2014  . Peripheral vascular disease, unspecified 08/03/2013  . Vasculitis 08/02/2013  . Cellulitis of leg, right 07/12/2013  . Ischemia of extremity 07/12/2013  . Cellulitis of leg, left 07/12/2013  . Cellulitis and abscess of leg 07/12/2013  . Compression fracture of T12 vertebra 10/08/2011  . MDS (myelodysplastic syndrome), low grade 09/20/2011  . Pancytopenia, acquired 06/17/2011  . MGUS (monoclonal gammopathy of unknown significance) 06/17/2011  . HIP PAIN, BILATERAL 11/07/2009  . COMPRESSION FRACTURE, THORACIC VERTEBRA 11/07/2009  . ADENOCARCINOMA, PROSTATE, GLEASON GRADE 7 10/17/2009  . BACK PAIN, LUMBAR 10/17/2009  . Sciatica 10/17/2009  . SKIN CANCER 06/05/2006  . HYPERTENSION, BENIGN SYSTEMIC 06/05/2006  . RHINITIS, ALLERGIC 06/05/2006  . REFLUX ESOPHAGITIS 06/05/2006    Denna Haggard, SPTA  10/27/2014 3:15 PM  Phone: [phone]  Fax: [phone]  Hessie Diener, PTA 10/27/2014 3:15 PM Phone: [phone] Fax: [facility] [place], [place], [address] Phone: [phone]   Fax:  [phone]

## 2014-10-27 NOTE — Patient Instructions (Signed)
Heat Therapy Heat therapy can help make painful, stiff muscles and joints feel better. Do not use heat on new injuries. Wait at least 48 hours after an injury to use heat. Do not use heat when you have aches or pains right after an activity. If you still have pain 3 hours after stopping the activity, then you may use heat. HOME CARE Wet heat pack  Soak a clean towel in warm water. Squeeze out the extra water.  Put the warm, wet towel in a plastic bag.  Place a thin, dry towel between your skin and the bag.  Put the heat pack on the area for 5 minutes, and check your skin. Your skin may be pink, but it should not be red.  Leave the heat pack on the area for 15 to 30 minutes.  Repeat this every 2 to 4 hours while awake. Do not use heat while you are sleeping. Warm water bath  Fill a tub with warm water.  Place the affected body part in the tub.  Soak the area for 20 to 40 minutes.  Repeat as needed. Hot water bottle  Fill the water bottle half full with hot water.  Press out the extra air. Close the cap tightly.  Place a dry towel between your skin and the bottle.  Put the bottle on the area for 5 minutes, and check your skin. Your skin may be pink, but it should not be red.  Leave the bottle on the area for 15 to 30 minutes.  Repeat this every 2 to 4 hours while awake. Electric heating pad  Place a dry towel between your skin and the heating pad.  Set the heating pad on low heat.  Put the heating pad on the area for 10 minutes, and check your skin. Your skin may be pink, but it should not be red.  Leave the heating pad on the area for 20 to 40 minutes.  Repeat this every 2 to 4 hours while awake.  Do not lie on the heating pad.  Do not fall asleep while using the heating pad.  Do not use the heating pad near water. GET HELP RIGHT AWAY IF:  You get blisters or red skin.  Your skin is puffy (swollen), or you lose feeling (numbness) in the affected area.  You  have any new problems.  Your problems are getting worse.  You have any questions or concerns. If you have any problems, stop using heat therapy until you see your doctor. MAKE SURE YOU:  Understand these instructions.  Will watch your condition.  Will get help right away if you are not doing well or get worse. Document Released: 06/17/2011 Document Reviewed: 05/18/2013 [facility] Patient Information 2015 [place]. This information is not intended to replace advice given to you by your health care provider. Make sure you discuss any questions you have with your health care provider.

## 2014-10-31 ENCOUNTER — Encounter (HOSPITAL_COMMUNITY)
Admission: RE | Admit: 2014-10-31 | Discharge: 2014-10-31 | Disposition: A | Discharge status: Home / Self Care | Payer: Medicare Other | Source: Ambulatory Visit | Attending: Oncology | Admitting: Oncology

## 2014-10-31 DIAGNOSIS — D61818 Other pancytopenia: Secondary | ICD-10-CM

## 2014-10-31 DIAGNOSIS — D72821 Monocytosis (symptomatic): Secondary | ICD-10-CM

## 2014-10-31 DIAGNOSIS — D4620 Refractory anemia with excess of blasts, unspecified: Secondary | ICD-10-CM

## 2014-10-31 DIAGNOSIS — D46Z Other myelodysplastic syndromes: Secondary | ICD-10-CM | POA: Diagnosis not present

## 2014-10-31 LAB — POCT HEMOGLOBIN-HEMACUE: Hemoglobin: 10.6 g/dL — ABNORMAL LOW (ref 13.0–17.0)

## 2014-10-31 MED ORDER — DARBEPOETIN ALFA 100 MCG/0.5ML IJ SOSY
PREFILLED_SYRINGE | INTRAMUSCULAR | Status: AC
  Administered 2014-10-31: 100 ug
  Filled 2014-10-31: qty 0.5

## 2014-10-31 MED ORDER — DARBEPOETIN ALFA 200 MCG/0.4ML IJ SOSY
PREFILLED_SYRINGE | INTRAMUSCULAR | Status: AC
  Administered 2014-10-31: 400 ug
  Filled 2014-10-31: qty 0.4

## 2014-10-31 MED ORDER — DARBEPOETIN ALFA 100 MCG/0.5ML IJ SOSY
500.0000 ug | PREFILLED_SYRINGE | INTRAMUSCULAR | Status: DC
  Filled 2014-10-31: qty 2.5

## 2014-11-01 ENCOUNTER — Ambulatory Visit: Payer: Medicare Other | Admitting: Physical Therapy

## 2014-11-01 DIAGNOSIS — Z9181 History of falling: Secondary | ICD-10-CM

## 2014-11-01 DIAGNOSIS — M545 Low back pain, unspecified: Secondary | ICD-10-CM

## 2014-11-01 DIAGNOSIS — R6889 Other general symptoms and signs: Secondary | ICD-10-CM

## 2014-11-01 DIAGNOSIS — Z7409 Other reduced mobility: Secondary | ICD-10-CM

## 2014-11-01 DIAGNOSIS — R531 Weakness: Secondary | ICD-10-CM

## 2014-11-01 DIAGNOSIS — R293 Abnormal posture: Secondary | ICD-10-CM

## 2014-11-01 NOTE — Patient Instructions (Signed)
Correct body mechanics and postures with [facility]'s handout (#2)

## 2014-11-01 NOTE — Therapy (Signed)
[place], [place], [address] Phone: [phone]   Fax:  [phone]  Physical Therapy Treatment  Patient Details  Name: Joel Macdonald MRN: [id number] Date of Birth: Oct 05, 1930 Referring Provider:  Lajean Manes, MD  Encounter Date: 11/01/2014      PT End of Session - 11/01/14 1246    Visit Number 4   Number of Visits 16   Date for PT Re-Evaluation 12/12/14   PT Start Time 2694   PT Stop Time 1240   PT Time Calculation (min) 55 min   Activity Tolerance Patient limited by pain;Patient tolerated treatment well   Behavior During Therapy [facility] for tasks assessed/performed      Past Medical History  Diagnosis Date  . MGUS (monoclonal gammopathy of unknown significance) 06/17/2011  . Cellulitis of leg, right 07/12/2013  . Ischemia of extremity 07/12/2013    Bilateral toes  2,3,4 right; 1,3 left  Vascular doppler normal for large vessel occlusion 2/15  . Heart murmur 1936  . Pancytopenia, acquired 06/17/2011    "from my Myelodysplastic syndrome"  . GERD (gastroesophageal reflux disease)   . Osteoarthritis     "back, legs, arms, shoulders" (07/12/2013)  . MDS (myelodysplastic syndrome), low grade 09/20/2011    Bone marrow bx 06/13/11: mild dyserythro/dymegakaryopoiesis.  Normal cytogenetics.  FISH negative for chrom 5 or 7 deletions.  Scattered small infiltrates of plasmacytoid lymphs  . Prostate cancer   . Edema, peripheral 03/21/2014  . Monocytosis 03/21/2014  . MDS (myelodysplastic syndrome), high grade 06/13/2014    7% blasts BM bx 05/05/14  . Senile purpura 10/18/2014    Past Surgical History  Procedure Laterality Date  . Tonsillectomy and adenoidectomy  ~ 1939  . Robot assisted laparoscopic radical prostatectomy  ~ 2010  . Cataract extraction w/ intraocular lens  implant, bilateral Bilateral ~ 2012    There were no vitals filed for this visit.  Visit Diagnosis:  Bilateral low back pain without  sciatica  Abnormal posture  Generalized weakness  Activity intolerance  Decreased strength, endurance, and mobility  At risk for falls      Subjective Assessment - 11/01/14 1148    Subjective better today; feels that exercises have been aggravating him/making pain worse affecting his sleep   Currently in Pain? Yes   Pain Score 3    Pain Location Back   Pain Orientation Lower   Aggravating Factors  standing or sitting too long without correcting posture - flexion bias   Pain Relieving Factors trunk flexion, repositioning, heat                          [facility] Adult PT Treatment/Exercise - 11/01/14 0001    Moist Heat Therapy   Number Minutes Moist Heat 10 Minutes   Moist Heat Location Lumbar Spine   Electrical Stimulation   Electrical Stimulation Location lumbar   Electrical Stimulation Action IFC   Electrical Stimulation Parameters to tolerance   Electrical Stimulation Goals Pain   Manual Therapy   Manual Therapy Soft tissue mobilization   Soft tissue mobilization lumbar/upper glutes; QL on R tight; PSIS insertions on L and lateral tight                PT Education - 11/01/14 1246    Education provided Yes   Education Details body mechanics and posture with ADLs   Person(s) Educated Patient   Methods Explanation;Handout   Comprehension Verbalized understanding  PT Short Term Goals - 11/01/14 1247    PT SHORT TERM GOAL #1   Title Pt will be independent with Initial HEP   Time 4   Period Weeks   Status On-going   PT SHORT TERM GOAL #2   Title "Demonstrate understanding of proper sitting posture and be more conscious of position and posture throughout the day.    Time 4   Period Weeks   Status Achieved   PT SHORT TERM GOAL #3   Title "Report pain decrease from 4 /10 to 2 /10. with functional activities   Time 4   Period Weeks   Status On-going   PT SHORT TERM GOAL #4   Title Assess balance using BERG balance scale and **set  a LTG**   Baseline 52/56 (10/25/14)   Time 4   Status Partially Met           PT Long Term Goals - 11/01/14 1249    PT LONG TERM GOAL #1   Title "Demonstrate and verbalize techniques to reduce the risk of re-injury including: lifting, posture, body mechanics especially for protection of compression fx   Time 8   Period Weeks   Status On-going   PT LONG TERM GOAL #2   Title "FOTO will improve from   42% limitation to  35%   indicating improved functional mobility.    Time 8   Period Weeks   Status On-going   PT LONG TERM GOAL #3   Title Pt will be independent with advance HEP for Core and LE strength   Time 8   Period Weeks   Status On-going   PT LONG TERM GOAL #4   Title Pt will be 0/10 with all functional activity and transitional movements   Time 8   Period Weeks   Status On-going   PT LONG TERM GOAL #5   Title Pt will be at least 4 to 4+/5 in LE MMT to reduce risk of falls on level surfaces and negotiating stairs   Time 8   Period Weeks   Status On-going   PT LONG TERM GOAL #6   Title Pt will tolerate standing without exacerbation of pain for 1 hour in order to shop for groceries   Time 8   Period Weeks   Status On-going   PT LONG TERM GOAL #7   Title Pt will demonstrate 15  sit to stand with minimal use of hands in order to demonstrate ability to execute transitional movements without aggravating spinal pain.   Time 8   Period Weeks   Status On-going               Plan - 11/01/14 1249    Clinical Impression Statement pt presented with concern that exercises were making him worse and not helping; focused on manual and modalities with STM using metal rock blade to BIL lumbar with findings of trigger points in Rt QL and Lt paraspinals; patient tolerated e-stim with MHP well with reduction in pain to 1-2/10 post treatment; pt I with demonstrating understanding of proper sitting and other postures throughout the day meeting STG #2; STG #4 partially met with BERG  assessed at 52/56 with related LTG still needed    PT Next Visit Plan Dry Needling; assess other goals and set LTG for BERG; assess post treatment with STM - continue if reduction with pain; flexion bias stretches and exercises    Consulted and Agree with Plan of Care Patient  Problem List Patient Active Problem List   Diagnosis Date Noted  . Senile purpura 10/18/2014  . MDS (myelodysplastic syndrome), high grade 06/13/2014  . Edema, peripheral 03/21/2014  . Monocytosis 03/21/2014  . Peripheral vascular disease, unspecified 08/03/2013  . Vasculitis 08/02/2013  . Cellulitis of leg, right 07/12/2013  . Ischemia of extremity 07/12/2013  . Cellulitis of leg, left 07/12/2013  . Cellulitis and abscess of leg 07/12/2013  . Compression fracture of T12 vertebra 10/08/2011  . MDS (myelodysplastic syndrome), low grade 09/20/2011  . Pancytopenia, acquired 06/17/2011  . MGUS (monoclonal gammopathy of unknown significance) 06/17/2011  . HIP PAIN, BILATERAL 11/07/2009  . COMPRESSION FRACTURE, THORACIC VERTEBRA 11/07/2009  . ADENOCARCINOMA, PROSTATE, GLEASON GRADE 7 10/17/2009  . BACK PAIN, LUMBAR 10/17/2009  . Sciatica 10/17/2009  . SKIN CANCER 06/05/2006  . HYPERTENSION, BENIGN SYSTEMIC 06/05/2006  . RHINITIS, ALLERGIC 06/05/2006  . REFLUX ESOPHAGITIS 06/05/2006     Denna Haggard, SPTA  11/01/2014 12:59 PM  Phone: [phone]  Fax: [phone]  Hessie Diener, PTA 11/02/2014 11:37 AM Phone: [phone] Fax: [phone]  During this treatment session, the therapist was present, participating in and directing the treatment.  [place], [place], [address] Phone: [phone]   Fax:  [phone]

## 2014-11-03 ENCOUNTER — Ambulatory Visit: Payer: Medicare Other | Admitting: Physical Therapy

## 2014-11-03 DIAGNOSIS — Z7409 Other reduced mobility: Secondary | ICD-10-CM

## 2014-11-03 DIAGNOSIS — R6889 Other general symptoms and signs: Secondary | ICD-10-CM

## 2014-11-03 DIAGNOSIS — R531 Weakness: Secondary | ICD-10-CM

## 2014-11-03 DIAGNOSIS — M545 Low back pain, unspecified: Secondary | ICD-10-CM

## 2014-11-03 DIAGNOSIS — Z9181 History of falling: Secondary | ICD-10-CM

## 2014-11-03 DIAGNOSIS — R293 Abnormal posture: Secondary | ICD-10-CM

## 2014-11-03 NOTE — Therapy (Signed)
[place], [place], [address] Phone: [phone]   Fax:  [phone]  Physical Therapy Treatment  Patient Details  Name: Joel Macdonald MRN: [id number] Date of Birth: [birth year] Referring Provider:  Lajean Manes, MD  Encounter Date: 11/03/2014      PT End of Session - 11/03/14 1150    Visit Number 5   Number of Visits 16   Date for PT Re-Evaluation 12/12/14   PT Start Time 6962   PT Stop Time 1240   PT Time Calculation (min) 55 min      Past Medical History  Diagnosis Date  . MGUS (monoclonal gammopathy of unknown significance) 06/17/2011  . Cellulitis of leg, right 07/12/2013  . Ischemia of extremity 07/12/2013    Bilateral toes  2,3,4 right; 1,3 left  Vascular doppler normal for large vessel occlusion 2/15  . Heart murmur 1936  . Pancytopenia, acquired 06/17/2011    "from my Myelodysplastic syndrome"  . GERD (gastroesophageal reflux disease)   . Osteoarthritis     "back, legs, arms, shoulders" (07/12/2013)  . MDS (myelodysplastic syndrome), low grade 09/20/2011    Bone marrow bx 06/13/11: mild dyserythro/dymegakaryopoiesis.  Normal cytogenetics.  FISH negative for chrom 5 or 7 deletions.  Scattered small infiltrates of plasmacytoid lymphs  . Prostate cancer   . Edema, peripheral 03/21/2014  . Monocytosis 03/21/2014  . MDS (myelodysplastic syndrome), high grade 06/13/2014    7% blasts BM bx 05/05/14  . Senile purpura 10/18/2014    Past Surgical History  Procedure Laterality Date  . Tonsillectomy and adenoidectomy  ~ 1939  . Robot assisted laparoscopic radical prostatectomy  ~ 2010  . Cataract extraction w/ intraocular lens  implant, bilateral Bilateral ~ 2012    There were no vitals filed for this visit.  Visit Diagnosis:  Bilateral low back pain without sciatica  Abnormal posture  Generalized weakness  Activity intolerance  Decreased strength, endurance, and mobility  At risk for falls      Subjective Assessment - 11/03/14 1147    Subjective Slept better since last visit. Pain not as intense in left low back since last visit.    Currently in Pain? Yes   Pain Score 2    Pain Location Back   Pain Orientation Lower   Pain Descriptors / Indicators Sharp;Aching   Pain Relieving Factors massage, estim                         [facility] Adult PT Treatment/Exercise - 11/03/14 1151    Lumbar Exercises: Stretches   Single Knee to Chest Stretch 2 reps;30 seconds   Lower Trunk Rotation 3 reps;30 seconds   Lumbar Exercises: Supine   Heel Slides 10 reps   Heel Slides Limitations cues to maintain neutral   Bent Knee Raise 10 reps   Bent Knee Raise Limitations cues to maintain neutral   Straight Leg Raise 10 reps   Isometric Hip Flexion 10 reps  bilateral   Isometric Hip Flexion Limitations opposite for obliques   Modalities   Modalities Electrical Stimulation;Ultrasound   Moist Heat Therapy   Number Minutes Moist Heat 15 Minutes   Moist Heat Location Lumbar Spine   Electrical Stimulation   Electrical Stimulation Location Lumbar   Electrical Stimulation Action IFC   Electrical Stimulation Parameters to tolerance   Electrical Stimulation Goals Pain   Ultrasound   Ultrasound Location Left QL    Ultrasound Parameters 100% 1.6w/cm2 1MZ in side  lying    Ultrasound Goals Pain                  PT Short Term Goals - 11/01/14 1247    PT SHORT TERM GOAL #1   Title Pt will be independent with Initial HEP   Time 4   Period Weeks   Status On-going   PT SHORT TERM GOAL #2   Title "Demonstrate understanding of proper sitting posture and be more conscious of position and posture throughout the day.    Time 4   Period Weeks   Status Achieved   PT SHORT TERM GOAL #3   Title "Report pain decrease from 4 /10 to 2 /10. with functional activities   Time 4   Period Weeks   Status On-going   PT SHORT TERM GOAL #4   Title Assess balance using BERG balance  scale and **set a LTG**   Baseline 52/56 (10/25/14)   Time 4   Status Partially Met           PT Long Term Goals - 11/01/14 1249    PT LONG TERM GOAL #1   Title "Demonstrate and verbalize techniques to reduce the risk of re-injury including: lifting, posture, body mechanics especially for protection of compression fx   Time 8   Period Weeks   Status On-going   PT LONG TERM GOAL #2   Title "FOTO will improve from   42% limitation to  35%   indicating improved functional mobility.    Time 8   Period Weeks   Status On-going   PT LONG TERM GOAL #3   Title Pt will be independent with advance HEP for Core and LE strength   Time 8   Period Weeks   Status On-going   PT LONG TERM GOAL #4   Title Pt will be 0/10 with all functional activity and transitional movements   Time 8   Period Weeks   Status On-going   PT LONG TERM GOAL #5   Title Pt will be at least 4 to 4+/5 in LE MMT to reduce risk of falls on level surfaces and negotiating stairs   Time 8   Period Weeks   Status On-going   PT LONG TERM GOAL #6   Title Pt will tolerate standing without exacerbation of pain for 1 hour in order to shop for groceries   Time 8   Period Weeks   Status On-going   PT LONG TERM GOAL #7   Title Pt will demonstrate 15  sit to stand with minimal use of hands in order to demonstrate ability to execute transitional movements without aggravating spinal pain.   Time 8   Period Weeks   Status On-going               Plan - 11/03/14 1241    Clinical Impression Statement Reviewed pt's HEP as well as progressed supne core exercises without increased pain. Pt is feeling less intense pain in left low back since last treatment using manual and modalities. Trial of [place] today in sidelying to lengthen left QL with patient feeling decreased pain after treatment. Ended with IFC/HMP.    PT Next Visit Plan assess pain/ goals. Possible TPDN to L>R QL, glut. See PT and set LTG for BERG if needed based on  results of 52/56        Problem List Patient Active Problem List   Diagnosis Date Noted  . Senile purpura 10/18/2014  . MDS (  myelodysplastic syndrome), high grade 06/13/2014  . Edema, peripheral 03/21/2014  . Monocytosis 03/21/2014  . Peripheral vascular disease, unspecified 08/03/2013  . Vasculitis 08/02/2013  . Cellulitis of leg, right 07/12/2013  . Ischemia of extremity 07/12/2013  . Cellulitis of leg, left 07/12/2013  . Cellulitis and abscess of leg 07/12/2013  . Compression fracture of T12 vertebra 10/08/2011  . MDS (myelodysplastic syndrome), low grade 09/20/2011  . Pancytopenia, acquired 06/17/2011  . MGUS (monoclonal gammopathy of unknown significance) 06/17/2011  . HIP PAIN, BILATERAL 11/07/2009  . COMPRESSION FRACTURE, THORACIC VERTEBRA 11/07/2009  . ADENOCARCINOMA, PROSTATE, GLEASON GRADE 7 10/17/2009  . BACK PAIN, LUMBAR 10/17/2009  . Sciatica 10/17/2009  . SKIN CANCER 06/05/2006  . HYPERTENSION, BENIGN SYSTEMIC 06/05/2006  . RHINITIS, ALLERGIC 06/05/2006  . REFLUX ESOPHAGITIS 06/05/2006    Dorene Ar, PTA 11/03/2014, 12:47 PM  [facility] [address] [place], [place], [address] Phone: [phone]   Fax:  [phone]

## 2014-11-07 ENCOUNTER — Ambulatory Visit: Payer: Medicare Other | Attending: Geriatric Medicine | Admitting: Physical Therapy

## 2014-11-07 DIAGNOSIS — R293 Abnormal posture: Secondary | ICD-10-CM | POA: Insufficient documentation

## 2014-11-07 DIAGNOSIS — Z7409 Other reduced mobility: Secondary | ICD-10-CM | POA: Diagnosis present

## 2014-11-07 DIAGNOSIS — Z9181 History of falling: Secondary | ICD-10-CM | POA: Diagnosis present

## 2014-11-07 DIAGNOSIS — R6889 Other general symptoms and signs: Secondary | ICD-10-CM | POA: Insufficient documentation

## 2014-11-07 DIAGNOSIS — M545 Low back pain, unspecified: Secondary | ICD-10-CM

## 2014-11-07 DIAGNOSIS — R531 Weakness: Secondary | ICD-10-CM | POA: Diagnosis present

## 2014-11-07 NOTE — Therapy (Signed)
[place], [place], [address] Phone: [phone]   Fax:  [phone]  Physical Therapy Treatment  Patient Details  Name: Joel Macdonald MRN: [id number] Date of Birth: 1931-03-07 Referring Provider:  Lajean Manes, MD  Encounter Date: 11/07/2014      PT End of Session - 11/07/14 1136    Visit Number 6   Number of Visits 16   Date for PT Re-Evaluation 12/12/14   Authorization Type [employer]   Authorization Time Period 12-12-14   Authorization - Number of Visits 16   PT Start Time 1135   PT Stop Time 1229   PT Time Calculation (min) 54 min   Activity Tolerance Patient tolerated treatment well   Behavior During Therapy [facility] for tasks assessed/performed      Past Medical History  Diagnosis Date  . MGUS (monoclonal gammopathy of unknown significance) 06/17/2011  . Cellulitis of leg, right 07/12/2013  . Ischemia of extremity 07/12/2013    Bilateral toes  2,3,4 right; 1,3 left  Vascular doppler normal for large vessel occlusion 2/15  . Heart murmur 1936  . Pancytopenia, acquired 06/17/2011    "from my Myelodysplastic syndrome"  . GERD (gastroesophageal reflux disease)   . Osteoarthritis     "back, legs, arms, shoulders" (07/12/2013)  . MDS (myelodysplastic syndrome), low grade 09/20/2011    Bone marrow bx 06/13/11: mild dyserythro/dymegakaryopoiesis.  Normal cytogenetics.  FISH negative for chrom 5 or 7 deletions.  Scattered small infiltrates of plasmacytoid lymphs  . Prostate cancer   . Edema, peripheral 03/21/2014  . Monocytosis 03/21/2014  . MDS (myelodysplastic syndrome), high grade 06/13/2014    7% blasts BM bx 05/05/14  . Senile purpura 10/18/2014    Past Surgical History  Procedure Laterality Date  . Tonsillectomy and adenoidectomy  ~ 1939  . Robot assisted laparoscopic radical prostatectomy  ~ 2010  . Cataract extraction w/ intraocular lens  implant, bilateral Bilateral ~ 2012    There were no vitals  filed for this visit.  Visit Diagnosis:  Bilateral low back pain without sciatica  Abnormal posture  Generalized weakness  Activity intolerance  Decreased strength, endurance, and mobility  At risk for falls      Subjective Assessment - 11/07/14 1136    Subjective Pt reports feeling better about a 2/10, I am able    Pertinent History Prostate canncer with robotics surgery post 6 years, anemia , has had 3 transfusion x 3 since December, cellulitis  3 bouts in last year. lymphedema in Right leg, myelodysplastic syndrome   How long can you sit comfortably? unlimited    How long can you stand comfortably? 20-30- min   How long can you walk comfortably? 20-30  min   Diagnostic tests MRI, xrays Dopplers   Patient Stated Goals back pain - reduce pain,    Currently in Pain? Yes   Pain Score 2    Pain Location Back   Pain Orientation Lower;Left  Left > Right   Pain Descriptors / Indicators Aching   Pain Type Chronic pain   Pain Onset More than a month ago   Pain Frequency Intermittent   Aggravating Factors  standing too long  flexion bias   Effect of Pain on Daily Activities difficulty getting up and down from chair            [facility] PT Assessment - 11/07/14 1153    Posture/Postural Control   Posture Comments left QL more elevated than Right  AROM   Lumbar Flexion 66   Lumbar Extension 24   Lumbar - Right Side Bend 22   Lumbar - Left Side Bend 20  ERP   Lumbar - Right Rotation 75%   Lumbar - Left Rotation 75%                     [facility] Adult PT Treatment/Exercise - 11/07/14 1153    Lumbar Exercises: Stretches   Single Knee to Chest Stretch 2 reps;30 seconds   Lower Trunk Rotation 3 reps;30 seconds   Lumbar Exercises: Aerobic   Stationary Bike Nustep 5 min level 3   Lumbar Exercises: Standing   Other Standing Lumbar Exercises sit to stand without UE support x 15 with one rest   Lumbar Exercises: Supine   Heel Slides --   Heel Slides Limitations --    Bent Knee Raise --   Bent Knee Raise Limitations --   Straight Leg Raise --   Isometric Hip Flexion --   Isometric Hip Flexion Limitations --   Lumbar Exercises: Sidelying   Other Sidelying Lumbar Exercises Quadratus Lumborum Right sidelying.  5 min   Modalities   Modalities Electrical Stimulation;Ultrasound   Moist Heat Therapy   Number Minutes Moist Heat 15 Minutes   Moist Heat Location Lumbar Spine   Electrical Stimulation   Electrical Stimulation Location Lumbar   Electrical Stimulation Action IFC   Electrical Stimulation Parameters to tolerance for 15 min   Electrical Stimulation Goals Pain   Ultrasound   Ultrasound Goals --   Manual Therapy   Manual Therapy Soft tissue mobilization   Soft tissue mobilization lumbar and upper gluts on left with IASTYM tool          Trigger Point Dry Needling - 11/07/14 1153    Consent Given? Yes   Muscles Treated Lower Body Gluteus minimus;Gluteus maximus  Quadratus Lumborum left and L-2 toL-4 left twitch response   Gluteus Maximus Response Twitch response elicited;Palpable increased muscle length  L-2/to L-4 paraspinal twitch response/palpable lengthening   Gluteus Minimus Response Twitch response elicited;Palpable increased muscle length              PT Education - 11/07/14 1722    Education provided Yes   Education Details trigger point dry needling and aftercare/ precautians   Person(s) Educated Patient   Methods Explanation;Demonstration   Comprehension Verbalized understanding          PT Short Term Goals - 11/07/14 1147    PT SHORT TERM GOAL #1   Title Pt will be independent with Initial HEP   Time 4   Period Weeks   Status Achieved   PT SHORT TERM GOAL #2   Title "Demonstrate understanding of proper sitting posture and be more conscious of position and posture throughout the day.    Time 4   Period Weeks   Status Achieved   PT SHORT TERM GOAL #3   Baseline 2/10 achieved   Time 4   Period Weeks   Status  Achieved   PT SHORT TERM GOAL #4   Title Assess balance using BERG balance scale and **set a LTG**   Baseline 52/56 (10/25/14)   Time 4   Period Weeks   Status Achieved           PT Long Term Goals - 11/07/14 1721    PT LONG TERM GOAL #1   Title "Demonstrate and verbalize techniques to reduce the risk of re-injury including: lifting, posture, body mechanics  especially for protection of compression fx   Time 8   Period Weeks   Status On-going   PT LONG TERM GOAL #2   Title "FOTO will improve from   42% limitation to  35%   indicating improved functional mobility.    Time 8   Period Weeks   Status On-going   PT LONG TERM GOAL #3   Title Pt will be independent with advance HEP for Core and LE strength   Time 8   Period Weeks   Status On-going   PT LONG TERM GOAL #4   Title Pt will be 0/10 with all functional activity and transitional movements   Time 8   Period Weeks   Status On-going   PT LONG TERM GOAL #5   Title Pt will be at least 4 to 4+/5 in LE MMT to reduce risk of falls on level surfaces and negotiating stairs   Time 8   Period Weeks   Status On-going   PT LONG TERM GOAL #6   Title Pt will tolerate standing without exacerbation of pain for 1 hour in order to shop for groceries   Time 8   Period Weeks   Status On-going   PT LONG TERM GOAL #7   Title Pt will demonstrate 15  sit to stand with minimal use of hands in order to demonstrate ability to execute transitional movements without aggravating spinal pain.   Baseline Pt able to do 15 sit to stand without UE support with one rest break   Time 8   Period Weeks   Status On-going               Plan - 11/07/14 1725    Clinical Impression Statement Pt at 2/10 pain level.  doing better on last exercises given and making improvements.  Pt wants to try trigger point drying needling today and try to eliminate nagging pain. Pt with increase in AROM of lumbar Flex 66, Ext 24, R SB 24 and left 22  rotation of  lumbar 75%.  much improved from evaluation.   Pt will benefit from skilled therapeutic intervention in order to improve on the following deficits Abnormal gait;Decreased activity tolerance;Decreased balance;Decreased mobility;Decreased range of motion;Decreased strength;Hypomobility;Difficulty walking;Increased fascial restricitons;Increased muscle spasms;Impaired flexibility;Postural dysfunction;Improper body mechanics;Pain   Rehab Potential Good   PT Frequency 2x / week   PT Duration 8 weeks   PT Treatment/Interventions ADLs/Self Care Home Management;Cryotherapy;Electrical Stimulation;Stair training;Gait training;Moist Heat;Functional mobility training;Therapeutic exercise;Balance training;Neuromuscular re-education;Manual techniques;Dry needling;Patient/family education;Passive range of motion;Taping   PT Next Visit Plan assess pain/ goals. Assess trigger point dry needling benefit.  Not LTG needed for BERG at 52/56   PT Home Exercise Plan Right side lying quadratus lumborum stretch    Consulted and Agree with Plan of Care Patient        Problem List Patient Active Problem List   Diagnosis Date Noted  . Senile purpura 10/18/2014  . MDS (myelodysplastic syndrome), high grade 06/13/2014  . Edema, peripheral 03/21/2014  . Monocytosis 03/21/2014  . Peripheral vascular disease, unspecified 08/03/2013  . Vasculitis 08/02/2013  . Cellulitis of leg, right 07/12/2013  . Ischemia of extremity 07/12/2013  . Cellulitis of leg, left 07/12/2013  . Cellulitis and abscess of leg 07/12/2013  . Compression fracture of T12 vertebra 10/08/2011  . MDS (myelodysplastic syndrome), low grade 09/20/2011  . Pancytopenia, acquired 06/17/2011  . MGUS (monoclonal gammopathy of unknown significance) 06/17/2011  . HIP PAIN, BILATERAL 11/07/2009  . COMPRESSION FRACTURE, THORACIC  VERTEBRA 11/07/2009  . ADENOCARCINOMA, PROSTATE, GLEASON GRADE 7 10/17/2009  . BACK PAIN, LUMBAR 10/17/2009  . Sciatica 10/17/2009   . SKIN CANCER 06/05/2006  . HYPERTENSION, BENIGN SYSTEMIC 06/05/2006  . RHINITIS, ALLERGIC 06/05/2006  . REFLUX ESOPHAGITIS 06/05/2006    Voncille Lo, PT 11/07/2014 5:29 PM Phone: [phone] Fax: [facility] [address] [place], [place], [address] Phone: [phone]   Fax:  [phone]

## 2014-11-07 NOTE — Patient Instructions (Signed)
Trigger Point Dry Needling  . What is Trigger Point Dry Needling (DN)? o DN is a physical therapy technique used to treat muscle pain and dysfunction. Specifically, DN helps deactivate muscle trigger points (muscle knots).  o A thin filiform needle is used to penetrate the skin and stimulate the underlying trigger point. The goal is for a local twitch response (LTR) to occur and for the trigger point to relax. No medication of any kind is injected during the procedure.   . What Does Trigger Point Dry Needling Feel Like?  o The procedure feels different for each individual patient. Some patients report that they do not actually feel the needle enter the skin and overall the process is not painful. Very mild bleeding may occur. However, many patients feel a deep cramping in the muscle in which the needle was inserted. This is the local twitch response.   Marland Kitchen How Will I feel after the treatment? o Soreness is normal, and the onset of soreness may not occur for a few hours. Typically this soreness does not last longer than two days.  o Bruising is uncommon, however; ice can be used to decrease any possible bruising.  o In rare cases feeling tired or nauseous after the treatment is normal. In addition, your symptoms may get worse before they get better, this period will typically not last longer than 24 hours.   . What Can I do After My Treatment? o Increase your hydration by drinking more water for the next 24 hours. o You may place ice or heat on the areas treated that have become sore, however, do not use heat on inflamed or bruised areas. Heat often brings more relief post needling. o You can continue your regular activities, but vigorous activity is not recommended initially after the treatment for 24 hours. o DN is best combined with other physical therapy such as strengthening, stretching, and other therapies.  Voncille Lo, PT 11/07/2014 11:51 AM Phone: [phone] Fax: [phone]

## 2014-11-09 ENCOUNTER — Ambulatory Visit: Payer: Medicare Other | Admitting: Physical Therapy

## 2014-11-09 DIAGNOSIS — R531 Weakness: Secondary | ICD-10-CM

## 2014-11-09 DIAGNOSIS — Z7409 Other reduced mobility: Secondary | ICD-10-CM

## 2014-11-09 DIAGNOSIS — M545 Low back pain: Secondary | ICD-10-CM | POA: Diagnosis not present

## 2014-11-09 DIAGNOSIS — R6889 Other general symptoms and signs: Secondary | ICD-10-CM

## 2014-11-09 DIAGNOSIS — R293 Abnormal posture: Secondary | ICD-10-CM

## 2014-11-09 NOTE — Therapy (Addendum)
[place], [place], [address] Phone: [phone]   Fax:  [phone]  Physical Therapy Treatment/ Discharge  Patient Details  Name: Joel Macdonald MRN: [id number] Date of Birth: [birth year] Referring Provider:  Lajean Manes, MD  Encounter Date: 11/09/2014      PT End of Session - 11/09/14 1549    Visit Number 7   Number of Visits 16   PT Start Time 0303   PT Stop Time 0349   PT Time Calculation (min) 46 min      Past Medical History  Diagnosis Date  . MGUS (monoclonal gammopathy of unknown significance) 06/17/2011  . Cellulitis of leg, right 07/12/2013  . Ischemia of extremity 07/12/2013    Bilateral toes  2,3,4 right; 1,3 left  Vascular doppler normal for large vessel occlusion 2/15  . Heart murmur 1936  . Pancytopenia, acquired 06/17/2011    "from my Myelodysplastic syndrome"  . GERD (gastroesophageal reflux disease)   . Osteoarthritis     "back, legs, arms, shoulders" (07/12/2013)  . MDS (myelodysplastic syndrome), low grade 09/20/2011    Bone marrow bx 06/13/11: mild dyserythro/dymegakaryopoiesis.  Normal cytogenetics.  FISH negative for chrom 5 or 7 deletions.  Scattered small infiltrates of plasmacytoid lymphs  . Prostate cancer   . Edema, peripheral 03/21/2014  . Monocytosis 03/21/2014  . MDS (myelodysplastic syndrome), high grade 06/13/2014    7% blasts BM bx 05/05/14  . Senile purpura 10/18/2014    Past Surgical History  Procedure Laterality Date  . Tonsillectomy and adenoidectomy  ~ 1939  . Robot assisted laparoscopic radical prostatectomy  ~ 2010  . Cataract extraction w/ intraocular lens  implant, bilateral Bilateral ~ 2012    There were no vitals filed for this visit.  Visit Diagnosis:  Abnormal posture  Generalized weakness  Activity intolerance  Decreased strength, endurance, and mobility      Subjective Assessment - 11/09/14 1506    Multiple Pain Sites Yes  Lt> RT tender lower  legs , cellulitis.  Better with compression stockings.  Wores with lymphodema                         [facility] Adult PT Treatment/Exercise - 11/09/14 1513    Lumbar Exercises: Aerobic   Stationary Bike Nu step 5 min, Level 5   Lumbar Exercises: Standing   Other Standing Lumbar Exercises stand to sit  15X No hands 15 reps   Lumbar Exercises: Supine   Ab Set 10 reps  cues   Clam 10 reps  each side   Bent Knee Raise 10 reps  2 Lbs each leg , cues   Bridge 10 reps   Bridge Limitations cues to lower one bone at a time  Also purple ball squeeze with brideg, cues   Other Supine Lumbar Exercises decompression shoulder presses, head press 10 reps, 5 second holds, Leg lengtheners 10 reps each, leg press 10 reps 5 second holds                PT Education - 11/09/14 1549    Education provided Yes   Education Details Decompression series from drawer   Person(s) Educated Patient   Methods Explanation;Demonstration;Verbal cues   Comprehension Verbalized understanding;Returned demonstration          PT Short Term Goals - 11/07/14 1147    PT SHORT TERM GOAL #1   Title Pt will be independent with Initial HEP   Time 4  Period Weeks   Status Achieved   PT SHORT TERM GOAL #2   Title "Demonstrate understanding of proper sitting posture and be more conscious of position and posture throughout the day.    Time 4   Period Weeks   Status Achieved   PT SHORT TERM GOAL #3   Baseline 2/10 achieved   Time 4   Period Weeks   Status Achieved   PT SHORT TERM GOAL #4   Title Assess balance using BERG balance scale and **set a LTG**   Baseline 52/56 (10/25/14)   Time 4   Period Weeks   Status Achieved           PT Long Term Goals - 11/09/14 1515    PT LONG TERM GOAL #1   Title "Demonstrate and verbalize techniques to reduce the risk of re-injury including: lifting, posture, body mechanics especially for protection of compression fx   Status On-going   PT LONG TERM  GOAL #2   Title "FOTO will improve from   42% limitation to  35%   indicating improved functional mobility.    Time 8   Period Weeks   Status Unable to assess   PT LONG TERM GOAL #3   Time 8   Period Weeks   Status On-going   PT LONG TERM GOAL #4   Title Pt will be 0/10 with all functional activity and transitional movements   Baseline 4/10 today   Time 8   Period Weeks   Status On-going   PT LONG TERM GOAL #6   Title Pt will tolerate standing without exacerbation of pain for 1 hour in order to shop for groceries   Baseline 15 to 20 minutes   Time 8   Period Weeks   Status On-going   PT LONG TERM GOAL #7   Title Pt will demonstrate 15  sit to stand with minimal use of hands in order to demonstrate ability to execute transitional movements without aggravating spinal pain.   Baseline 15 no hands no hands no increased back pain   Time 8   Period Weeks   Status Achieved               Plan - 11/09/14 1550    Clinical Impression Statement Undertemined if DN helpful.  Better at times, today soire every where.  Sit to stand goal met , Hip Abd strength bilateral 4-/5.   PT Next Visit Plan review decompression, multifitus?   Consulted and Agree with Plan of Care Patient        Problem List Patient Active Problem List   Diagnosis Date Noted  . Senile purpura 10/18/2014  . MDS (myelodysplastic syndrome), high grade 06/13/2014  . Edema, peripheral 03/21/2014  . Monocytosis 03/21/2014  . Peripheral vascular disease, unspecified 08/03/2013  . Vasculitis 08/02/2013  . Cellulitis of leg, right 07/12/2013  . Ischemia of extremity 07/12/2013  . Cellulitis of leg, left 07/12/2013  . Cellulitis and abscess of leg 07/12/2013  . Compression fracture of T12 vertebra 10/08/2011  . MDS (myelodysplastic syndrome), low grade 09/20/2011  . Pancytopenia, acquired 06/17/2011  . MGUS (monoclonal gammopathy of unknown significance) 06/17/2011  . HIP PAIN, BILATERAL 11/07/2009  .  COMPRESSION FRACTURE, THORACIC VERTEBRA 11/07/2009  . ADENOCARCINOMA, PROSTATE, GLEASON GRADE 7 10/17/2009  . BACK PAIN, LUMBAR 10/17/2009  . Sciatica 10/17/2009  . SKIN CANCER 06/05/2006  . HYPERTENSION, BENIGN SYSTEMIC 06/05/2006  . RHINITIS, ALLERGIC 06/05/2006  . REFLUX ESOPHAGITIS 06/05/2006    Zettie Gootee 11/09/2014,  3:52 PM  [place], [place], [address] Phone: [phone]   Fax:  [phone]     Melvenia Needles, PTA 11/09/2014 3:52 PM Phone: [phone] Fax: [phone] PHYSICAL THERAPY DISCHARGE SUMMARY  Visits from Start of Care: 7  Current functional level related to goals / functional outcomes: Unknown   Remaining deficits: Unknown   Education / Equipment: Initial HEP Plan:                                                    Patient goals were partially met. Patient is being discharged due to not returning since the last visit.  ?????       Voncille Lo, PT 02/16/2015 9:32 AM Phone: [phone] Fax: [phone]

## 2014-11-14 ENCOUNTER — Ambulatory Visit: Payer: Medicare Other | Admitting: Physical Therapy

## 2014-11-15 ENCOUNTER — Emergency Department (HOSPITAL_COMMUNITY)
Admission: EM | Admit: 2014-11-15 | Discharge: 2014-11-15 | Discharge status: Against Medical Advice | Payer: Medicare Other | Attending: Emergency Medicine | Admitting: Emergency Medicine

## 2014-11-15 ENCOUNTER — Encounter (HOSPITAL_COMMUNITY): Payer: Self-pay | Admitting: Physical Medicine and Rehabilitation

## 2014-11-15 DIAGNOSIS — R011 Cardiac murmur, unspecified: Secondary | ICD-10-CM | POA: Insufficient documentation

## 2014-11-15 DIAGNOSIS — R2243 Localized swelling, mass and lump, lower limb, bilateral: Secondary | ICD-10-CM | POA: Diagnosis present

## 2014-11-15 LAB — COMPREHENSIVE METABOLIC PANEL
ALBUMIN: 3.7 g/dL (ref 3.5–5.0)
ALK PHOS: 73 U/L (ref 38–126)
ALT: 11 U/L — ABNORMAL LOW (ref 17–63)
AST: 16 U/L (ref 15–41)
Anion gap: 12 (ref 5–15)
BUN: 13 mg/dL (ref 6–20)
CALCIUM: 9.2 mg/dL (ref 8.9–10.3)
CO2: 22 mmol/L (ref 22–32)
Chloride: 102 mmol/L (ref 101–111)
Creatinine, Ser: 0.97 mg/dL (ref 0.61–1.24)
GFR calc Af Amer: >60 mL/min (ref >60)
GFR calc non Af Amer: >60 mL/min (ref >60)
Glucose, Bld: 103 mg/dL — ABNORMAL HIGH (ref 65–99)
POTASSIUM: 4.2 mmol/L (ref 3.5–5.1)
Sodium: 136 mmol/L (ref 135–145)
TOTAL PROTEIN: 7.5 g/dL (ref 6.5–8.1)
Total Bilirubin: 0.9 mg/dL (ref 0.3–1.2)

## 2014-11-15 LAB — CBC WITH DIFFERENTIAL/PLATELET
BASOS ABS: 0 10*3/uL (ref 0.0–0.1)
Band Neutrophils: 0 % (ref 0–10)
Basophils Relative: 0 % (ref 0–1)
Blasts: 0 %
Eosinophils Absolute: 0 10*3/uL (ref 0.0–0.7)
Eosinophils Relative: 0 % (ref 0–5)
HCT: 31.8 % — ABNORMAL LOW (ref 39.0–52.0)
Hemoglobin: 10.3 g/dL — ABNORMAL LOW (ref 13.0–17.0)
LYMPHS PCT: 25 % (ref 12–46)
Lymphs Abs: 2.3 10*3/uL (ref 0.7–4.0)
MCH: 33.8 pg (ref 26.0–34.0)
MCHC: 32.4 g/dL (ref 30.0–36.0)
MCV: 104.3 fL — AB (ref 78.0–100.0)
MONOS PCT: 39 % — AB (ref 3–12)
Metamyelocytes Relative: 0 %
Monocytes Absolute: 3.6 10*3/uL — ABNORMAL HIGH (ref 0.1–1.0)
Myelocytes: 0 %
NRBC: 0 /100{WBCs}
Neutro Abs: 3.3 10*3/uL (ref 1.7–7.7)
Neutrophils Relative %: 36 % — ABNORMAL LOW (ref 43–77)
Other: 0 %
PLATELETS: 173 10*3/uL (ref 150–400)
Promyelocytes Absolute: 0 %
RBC: 3.05 MIL/uL — AB (ref 4.22–5.81)
RDW: 15.1 % (ref 11.5–15.5)
WBC: 9.2 10*3/uL (ref 4.0–10.5)

## 2014-11-15 NOTE — ED Notes (Signed)
PT states that he does not want to wait, has been in contact with his PCP. Ambulatory with steady gait, NAD

## 2014-11-15 NOTE — ED Notes (Addendum)
Pt presents to department for evaluation of bilateral lower leg swelling/redness. History of cellulitis. Upon arrival both lower legs noted to be red, swollen and warm to touch. 8/10 pain at the time. Recently started on doxycycline.

## 2014-11-17 ENCOUNTER — Ambulatory Visit: Payer: Medicare Other | Admitting: Physical Therapy

## 2014-11-17 ENCOUNTER — Encounter: Payer: Self-pay | Admitting: Oncology

## 2014-11-17 ENCOUNTER — Ambulatory Visit (INDEPENDENT_AMBULATORY_CARE_PROVIDER_SITE_OTHER): Payer: Medicare Other | Admitting: Oncology

## 2014-11-17 VITALS — BP 152/75 | HR 83 | Temp 97.5°F | Wt 192.1 lb

## 2014-11-17 DIAGNOSIS — L02416 Cutaneous abscess of left lower limb: Secondary | ICD-10-CM

## 2014-11-17 DIAGNOSIS — L0291 Cutaneous abscess, unspecified: Secondary | ICD-10-CM

## 2014-11-17 HISTORY — DX: Cutaneous abscess, unspecified: L02.91

## 2014-11-17 NOTE — Progress Notes (Signed)
Incision and Drainage Procedure Note  Pre-operative Diagnosis: Abscess of left lower extremity  Post-operative Diagnosis: same  Indications: abscess  Anesthesia: 1% plain lidocaine 5cc  Procedure Details  The procedure, risks and complications have been discussed in detail (including, but not limited to airway compromise, infection, bleeding) with the patient, and the patient has signed consent to the procedure.  The skin was sterilely prepped and draped over the affected area in the usual fashion. After adequate local anesthesia, I&D with a #11 blade was performed on the left lower extremity. Purulent drainage: present The patient was observed until stable. The wound was packed with moistened gauze. Sterile dressings were applied. A culture was obtained from the site.  Findings: Purulent drainage at site.  EBL: none  Drains: none  Condition: Tolerated procedure well   Complications: none.  [name] was present and supervised this procedure.

## 2014-11-18 NOTE — Progress Notes (Signed)
Patient ID: Joel Macdonald, male   DOB: 01-20-1931, 81 y.o.   MRN: [id number] Hematology and Oncology Follow Up Visit  Joel Macdonald [id number] 01/24/1931 81 y.o. 11/18/2014 9:23 AM   Principle Diagnosis: Encounter Diagnosis  Name Primary?  . 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 Current treatment with red cell growth factor alone to minimize transfusion requirements. I believe his MDS has led to a defect in neutrophil function. He has had chronic problems with recurrent, severe, bilateral, lower extremity cellulitis and edema. He has been on multiple courses of antibiotics. At time of his most recent visit with me on July 12, legs actually looked better than they have in the past. He called me this morning to report a flareup of cellulitis left lower extremity and evolving abscess left ankle. He felt feverish and had chills over the weekend. He called his primary care physician on Monday. He was put on another course of doxycycline. He called his physician again on Tuesday to report no improvement. He was referred to the emergency department for acute attention and to receive parenteral antibiotics. He went to the emergency department but after waiting 2 hours and not being evaluated, except for lab tests, he left. When he called his primary care physician again today, the physician.was scheduled to be out of the office for the next few days so he asked if I could help. He has not had anymore fever or chills since the weekend 5 days ago. He never actually took his temperature. He denied any dyspnea, chest pain, palpitations, change in bowel habit, diarrhea, nausea, vomiting, hematuria, or hematochezia.  Medications: reviewed  Allergies:  Allergies  Allergen Reactions  . Nsaids Other (See Comments)  . Other Other (See Comments)    Ragweed  causes nasal congestion    Review of Systems: See history of present illness Remaining ROS negative:   Physical Exam: Blood pressure 152/75, pulse 83, temperature 97.5 F (36.4 C), temperature source Oral, weight 192 lb 1.6 oz (87.136 kg), SpO2 100 %. Wt Readings from Last 3 Encounters:  11/17/14 192 lb 1.6 oz (87.136 kg)  10/18/14 192 lb 12.8 oz (87.454 kg)  08/16/14 190 lb 8 oz (86.41 kg)     General appearance: Improving nutrition. No distress. HENNT: Pharynx no erythema, exudate, mass, or ulcer. No thyromegaly or thyroid nodules Lymph nodes: No cervical, supraclavicular, or axillary lymphadenopathy Breasts:  Lungs: Clear to auscultation, resonant to percussion throughout Heart: Regular rhythm, no murmur, no gallop, no rub, no click, no edema Abdomen: Soft, nontender, normal bowel sounds, no mass, no organomegaly Extremities: Chronic bilateral leg edema, 1-2 plus stable compared with prior exam no calf tenderness. There is a developing abscess on the anteromedial left ankle which is fluctuant. Minimal surrounding erythema. No significant extension of cellulitis to the skin of the rest of the leg. Musculoskeletal: no joint deformities GU:  Vascular: Carotid pulses 2+, no bruits, distal pulses: Dorsalis pedis 1+ symmetric Neurologic: Alert, oriented, PERRLA, optic discs sharp and vessels normal, no hemorrhage or exudate, cranial nerves grossly normal, motor strength 5 over 5, reflexes 1+ symmetric, upper body coordination normal, gait normal, Skin: No rash , multiple ecchymosis on the skin of his forearms unchanged  Lab Results: CBC W/Diff    Component Value Date/Time   WBC 9.2 11/15/2014 1423   WBC 3.0* 05/17/2013 1258   RBC 3.05*  11/15/2014 1423   RBC 3.13* 06/13/2014 1022   RBC 3.01* 05/17/2013 1258   HGB 10.3* 11/15/2014 1423   HGB 10.4* 05/17/2013 1258   HCT 31.8* 11/15/2014 1423   HCT 31.5* 05/17/2013 1258   PLT 173 11/15/2014 1423   PLT 84* 05/17/2013 1258   MCV  104.3* 11/15/2014 1423   MCV 104.7* 05/17/2013 1258   MCH 33.8 11/15/2014 1423   MCH 34.6* 05/17/2013 1258   MCHC 32.4 11/15/2014 1423   MCHC 33.0 05/17/2013 1258   RDW 15.1 11/15/2014 1423   RDW 16.0* 05/17/2013 1258   LYMPHSABS 2.3 11/15/2014 1423   LYMPHSABS 1.5 05/17/2013 1258   MONOABS 3.6* 11/15/2014 1423   MONOABS 0.8 05/17/2013 1258   EOSABS 0.0 11/15/2014 1423   EOSABS 0.0 05/17/2013 1258   BASOSABS 0.0 11/15/2014 1423   BASOSABS 0.0 05/17/2013 1258     Chemistry      Component Value Date/Time   NA 136 11/15/2014 1423   NA 139 11/09/2012 1425   K 4.2 11/15/2014 1423   K 4.2 11/09/2012 1425   CL 102 11/15/2014 1423   CO2 22 11/15/2014 1423   CO2 24 11/09/2012 1425   BUN 13 11/15/2014 1423   BUN 22.4 11/09/2012 1425   CREATININE 0.97 11/15/2014 1423   CREATININE 0.88 10/17/2014 1101   CREATININE 1.39* 03/23/2014 0730   CREATININE 1.2 11/09/2012 1425      Component Value Date/Time   CALCIUM 9.2 11/15/2014 1423   CALCIUM 9.3 11/09/2012 1425   ALKPHOS 73 11/15/2014 1423   ALKPHOS 62 11/09/2012 1425   AST 16 11/15/2014 1423   AST 20 11/09/2012 1425   ALT 11* 11/15/2014 1423   ALT 19 11/09/2012 1425   BILITOT 0.9 11/15/2014 1423   BILITOT 0.57 11/09/2012 1425       Radiological Studies: No results found.  Impression:  #1. Cutaneous abscess approximately 3 x 3 cm medial lateral left ankle. He is afebrile. No associated cellulitis. I feel this is a simple cutaneous abscess that can be drained in the office. He is encouraged to continue a 10 day course of doxycycline and call for any changes.  #2. High-risk myelodysplastic syndrome Blood counts on August 9 little change from his baseline with a relative leukocytosis for him total white count 9200. Chronic monocytosis 39%. Hemoglobin at his baseline of 10.3 g on ESA support.  I personally assisted and supervised resident physician Dr. Joni Reining while he performed an incision and drainage procedure on the  abscess.   CC: Patient Care Team: Lajean Manes, MD as PCP - General (Internal Medicine) Annia Belt, MD as Consulting Physician (Oncology)   Annia Belt, MD 8/12/20169:23 AM

## 2014-11-18 NOTE — Progress Notes (Signed)
Medicine attending: I personally supervised and assisted resident physician Dr. Joni Reining Mahe performed an incision and drainage on a cutaneous abscess, left ankle, on this patient.

## 2014-11-20 LAB — WOUND CULTURE

## 2014-11-21 ENCOUNTER — Encounter (HOSPITAL_COMMUNITY)
Admission: RE | Admit: 2014-11-21 | Discharge: 2014-11-21 | Disposition: A | Discharge status: Home / Self Care | Payer: Medicare Other | Source: Ambulatory Visit | Attending: Oncology | Admitting: Oncology

## 2014-11-21 ENCOUNTER — Encounter: Payer: Medicare Other | Admitting: Physical Therapy

## 2014-11-21 DIAGNOSIS — D4620 Refractory anemia with excess of blasts, unspecified: Secondary | ICD-10-CM

## 2014-11-21 DIAGNOSIS — D469 Myelodysplastic syndrome, unspecified: Secondary | ICD-10-CM | POA: Diagnosis present

## 2014-11-21 DIAGNOSIS — D72821 Monocytosis (symptomatic): Secondary | ICD-10-CM | POA: Diagnosis not present

## 2014-11-21 DIAGNOSIS — D61818 Other pancytopenia: Secondary | ICD-10-CM

## 2014-11-21 LAB — POCT HEMOGLOBIN-HEMACUE: Hemoglobin: 9.6 g/dL — ABNORMAL LOW (ref 13.0–17.0)

## 2014-11-21 MED ORDER — DARBEPOETIN ALFA 100 MCG/0.5ML IJ SOSY
500.0000 ug | PREFILLED_SYRINGE | INTRAMUSCULAR | Status: DC
  Filled 2014-11-21: qty 2.5

## 2014-11-21 MED ORDER — DARBEPOETIN ALFA 200 MCG/0.4ML IJ SOSY
PREFILLED_SYRINGE | INTRAMUSCULAR | Status: AC
  Administered 2014-11-21: 400 ug via SUBCUTANEOUS
  Filled 2014-11-21: qty 0.8

## 2014-11-21 MED ORDER — DARBEPOETIN ALFA 100 MCG/0.5ML IJ SOSY
PREFILLED_SYRINGE | INTRAMUSCULAR | Status: AC
  Administered 2014-11-21: 100 ug via SUBCUTANEOUS
  Filled 2014-11-21: qty 0.5

## 2014-12-13 ENCOUNTER — Encounter (HOSPITAL_COMMUNITY)
Admission: RE | Admit: 2014-12-13 | Discharge: 2014-12-13 | Disposition: A | Discharge status: Home / Self Care | Payer: Medicare Other | Source: Ambulatory Visit | Attending: Oncology | Admitting: Oncology

## 2014-12-13 DIAGNOSIS — D46Z Other myelodysplastic syndromes: Secondary | ICD-10-CM | POA: Insufficient documentation

## 2014-12-13 DIAGNOSIS — D4620 Refractory anemia with excess of blasts, unspecified: Secondary | ICD-10-CM

## 2014-12-13 DIAGNOSIS — D619 Aplastic anemia, unspecified: Secondary | ICD-10-CM | POA: Insufficient documentation

## 2014-12-13 DIAGNOSIS — D61818 Other pancytopenia: Secondary | ICD-10-CM

## 2014-12-13 DIAGNOSIS — D72821 Monocytosis (symptomatic): Secondary | ICD-10-CM | POA: Insufficient documentation

## 2014-12-13 LAB — POCT HEMOGLOBIN-HEMACUE: HEMOGLOBIN: 9.2 g/dL — AB (ref 13.0–17.0)

## 2014-12-13 MED ORDER — DARBEPOETIN ALFA 100 MCG/0.5ML IJ SOSY
PREFILLED_SYRINGE | INTRAMUSCULAR | Status: AC
  Administered 2014-12-13: 100 ug via SUBCUTANEOUS
  Filled 2014-12-13: qty 0.5

## 2014-12-13 MED ORDER — DARBEPOETIN ALFA 200 MCG/0.4ML IJ SOSY
PREFILLED_SYRINGE | INTRAMUSCULAR | Status: AC
  Administered 2014-12-13: 400 ug via SUBCUTANEOUS
  Filled 2014-12-13: qty 0.8

## 2014-12-13 MED ORDER — DARBEPOETIN ALFA 100 MCG/0.5ML IJ SOSY: 500.0000 ug | PREFILLED_SYRINGE | INTRAMUSCULAR | Status: DC

## 2015-01-03 ENCOUNTER — Encounter (HOSPITAL_COMMUNITY)
Admission: RE | Admit: 2015-01-03 | Discharge: 2015-01-03 | Disposition: A | Discharge status: Home / Self Care | Payer: Medicare Other | Source: Ambulatory Visit | Attending: Oncology | Admitting: Oncology

## 2015-01-03 DIAGNOSIS — D46Z Other myelodysplastic syndromes: Secondary | ICD-10-CM | POA: Diagnosis not present

## 2015-01-03 DIAGNOSIS — D61818 Other pancytopenia: Secondary | ICD-10-CM

## 2015-01-03 DIAGNOSIS — D72821 Monocytosis (symptomatic): Secondary | ICD-10-CM

## 2015-01-03 DIAGNOSIS — D4620 Refractory anemia with excess of blasts, unspecified: Secondary | ICD-10-CM

## 2015-01-03 LAB — POCT HEMOGLOBIN-HEMACUE: HEMOGLOBIN: 10.6 g/dL — AB (ref 13.0–17.0)

## 2015-01-03 MED ORDER — DARBEPOETIN ALFA 100 MCG/0.5ML IJ SOSY
500.0000 ug | PREFILLED_SYRINGE | INTRAMUSCULAR | Status: DC
  Administered 2015-01-03: 500 ug via SUBCUTANEOUS
  Filled 2015-01-03: qty 2.5

## 2015-01-03 MED ORDER — DARBEPOETIN ALFA 100 MCG/0.5ML IJ SOSY
PREFILLED_SYRINGE | INTRAMUSCULAR | Status: AC
  Filled 2015-01-03: qty 0.5

## 2015-01-03 MED ORDER — DARBEPOETIN ALFA 200 MCG/0.4ML IJ SOSY
PREFILLED_SYRINGE | INTRAMUSCULAR | Status: AC
  Filled 2015-01-03: qty 0.8

## 2015-01-04 MED FILL — Darbepoetin Alfa Soln Prefilled Syringe 200 MCG/0.4ML: INTRAMUSCULAR | Qty: 0.8 | Status: AC

## 2015-01-04 MED FILL — Darbepoetin Alfa Soln Prefilled Syringe 100 MCG/0.5ML: INTRAMUSCULAR | Qty: 0.5 | Status: AC

## 2015-01-24 ENCOUNTER — Encounter (HOSPITAL_COMMUNITY)
Admission: RE | Admit: 2015-01-24 | Discharge: 2015-01-24 | Disposition: A | Discharge status: Home / Self Care | Payer: Medicare Other | Source: Ambulatory Visit | Attending: Oncology | Admitting: Oncology

## 2015-01-24 DIAGNOSIS — D46Z Other myelodysplastic syndromes: Secondary | ICD-10-CM | POA: Diagnosis present

## 2015-01-24 DIAGNOSIS — D619 Aplastic anemia, unspecified: Secondary | ICD-10-CM | POA: Diagnosis present

## 2015-01-24 DIAGNOSIS — D72821 Monocytosis (symptomatic): Secondary | ICD-10-CM

## 2015-01-24 DIAGNOSIS — D61818 Other pancytopenia: Secondary | ICD-10-CM

## 2015-01-24 DIAGNOSIS — D4620 Refractory anemia with excess of blasts, unspecified: Secondary | ICD-10-CM

## 2015-01-24 LAB — POCT HEMOGLOBIN-HEMACUE: Hemoglobin: 9.3 g/dL — ABNORMAL LOW (ref 13.0–17.0)

## 2015-01-24 MED ORDER — DARBEPOETIN ALFA 100 MCG/0.5ML IJ SOSY
500.0000 ug | PREFILLED_SYRINGE | INTRAMUSCULAR | Status: DC
  Filled 2015-01-24: qty 2.5

## 2015-01-24 MED ORDER — DARBEPOETIN ALFA 200 MCG/0.4ML IJ SOSY
PREFILLED_SYRINGE | INTRAMUSCULAR | Status: AC
  Administered 2015-01-24: 400 ug via SUBCUTANEOUS
  Filled 2015-01-24: qty 0.8

## 2015-01-24 MED ORDER — DARBEPOETIN ALFA 100 MCG/0.5ML IJ SOSY
PREFILLED_SYRINGE | INTRAMUSCULAR | Status: AC
  Administered 2015-01-24: 100 ug via SUBCUTANEOUS
  Filled 2015-01-24: qty 0.5

## 2015-02-10 ENCOUNTER — Other Ambulatory Visit (INDEPENDENT_AMBULATORY_CARE_PROVIDER_SITE_OTHER): Payer: Medicare Other

## 2015-02-10 DIAGNOSIS — D61818 Other pancytopenia: Secondary | ICD-10-CM

## 2015-02-10 DIAGNOSIS — D46Z Other myelodysplastic syndromes: Secondary | ICD-10-CM

## 2015-02-10 LAB — SAVE SMEAR

## 2015-02-11 LAB — CMP14 + ANION GAP
A/G RATIO: 1.7 (ref 1.1–2.5)
ALBUMIN: 4.4 g/dL (ref 3.5–4.7)
ALK PHOS: 87 IU/L (ref 39–117)
ALT: 10 IU/L (ref 0–44)
AST: 18 IU/L (ref 0–40)
Anion Gap: 16 mmol/L (ref 10.0–18.0)
BILIRUBIN TOTAL: 0.5 mg/dL (ref 0.0–1.2)
BUN / CREAT RATIO: 24 — AB (ref 10–22)
BUN: 20 mg/dL (ref 8–27)
CHLORIDE: 101 mmol/L (ref 97–106)
CO2: 22 mmol/L (ref 18–29)
Calcium: 9.2 mg/dL (ref 8.6–10.2)
Creatinine, Ser: 0.83 mg/dL (ref 0.76–1.27)
GFR calc non Af Amer: 82 mL/min/{1.73_m2} (ref >59)
GFR, EST AFRICAN AMERICAN: 95 mL/min/{1.73_m2} (ref >59)
GLUCOSE: 96 mg/dL (ref 65–99)
Globulin, Total: 2.6 g/dL (ref 1.5–4.5)
POTASSIUM: 4.8 mmol/L (ref 3.5–5.2)
Sodium: 139 mmol/L (ref 136–144)
TOTAL PROTEIN: 7 g/dL (ref 6.0–8.5)

## 2015-02-11 LAB — CBC WITH DIFFERENTIAL/PLATELET
BASOS ABS: 0 10*3/uL (ref 0.0–0.2)
BASOS: 0 %
EOS (ABSOLUTE): 0 10*3/uL (ref 0.0–0.4)
Eos: 1 %
HEMATOCRIT: 32.8 % — AB (ref 37.5–51.0)
Hemoglobin: 10.7 g/dL — ABNORMAL LOW (ref 12.6–17.7)
IMMATURE GRANS (ABS): 0.1 10*3/uL (ref 0.0–0.1)
IMMATURE GRANULOCYTES: 1 %
LYMPHS: 33 %
Lymphocytes Absolute: 1.4 10*3/uL (ref 0.7–3.1)
MCH: 33.8 pg — ABNORMAL HIGH (ref 26.6–33.0)
MCHC: 32.6 g/dL (ref 31.5–35.7)
MCV: 104 fL — ABNORMAL HIGH (ref 79–97)
MONOS ABS: 1.8 10*3/uL — AB (ref 0.1–0.9)
Monocytes: 41 %
NEUTROS PCT: 24 %
Neutrophils Absolute: 1 10*3/uL — ABNORMAL LOW (ref 1.4–7.0)
PLATELETS: 153 10*3/uL (ref 150–379)
RBC: 3.17 x10E6/uL — ABNORMAL LOW (ref 4.14–5.80)
RDW: 15.8 % — AB (ref 12.3–15.4)
WBC: 4.4 10*3/uL (ref 3.4–10.8)

## 2015-02-11 LAB — LACTATE DEHYDROGENASE: LDH: 214 IU/L (ref 121–224)

## 2015-02-11 LAB — URIC ACID: URIC ACID: 4.9 mg/dL (ref 3.7–8.6)

## 2015-02-14 ENCOUNTER — Telehealth: Payer: Self-pay | Admitting: *Deleted

## 2015-02-14 ENCOUNTER — Encounter (HOSPITAL_COMMUNITY)
Admission: RE | Admit: 2015-02-14 | Discharge: 2015-02-14 | Disposition: A | Discharge status: Home / Self Care | Payer: Medicare Other | Source: Ambulatory Visit | Attending: Oncology | Admitting: Oncology

## 2015-02-14 DIAGNOSIS — D46Z Other myelodysplastic syndromes: Secondary | ICD-10-CM | POA: Insufficient documentation

## 2015-02-14 DIAGNOSIS — D619 Aplastic anemia, unspecified: Secondary | ICD-10-CM | POA: Insufficient documentation

## 2015-02-14 DIAGNOSIS — D61818 Other pancytopenia: Secondary | ICD-10-CM

## 2015-02-14 DIAGNOSIS — D4620 Refractory anemia with excess of blasts, unspecified: Secondary | ICD-10-CM

## 2015-02-14 DIAGNOSIS — D72821 Monocytosis (symptomatic): Secondary | ICD-10-CM | POA: Diagnosis present

## 2015-02-14 LAB — POCT HEMOGLOBIN-HEMACUE: Hemoglobin: 10.3 g/dL — ABNORMAL LOW (ref 13.0–17.0)

## 2015-02-14 MED ORDER — DARBEPOETIN ALFA 100 MCG/0.5ML IJ SOSY
PREFILLED_SYRINGE | INTRAMUSCULAR | Status: AC
  Administered 2015-02-14: 100 ug via SUBCUTANEOUS
  Filled 2015-02-14: qty 0.5

## 2015-02-14 MED ORDER — DARBEPOETIN ALFA 200 MCG/0.4ML IJ SOSY
PREFILLED_SYRINGE | INTRAMUSCULAR | Status: AC
  Administered 2015-02-14: 400 ug via SUBCUTANEOUS
  Filled 2015-02-14: qty 0.8

## 2015-02-14 MED ORDER — DARBEPOETIN ALFA 100 MCG/0.5ML IJ SOSY
500.0000 ug | PREFILLED_SYRINGE | INTRAMUSCULAR | Status: DC
  Filled 2015-02-14: qty 2.5

## 2015-02-14 NOTE — Telephone Encounter (Signed)
Pt called - no answer; left message labs are good and Hgb is 10.7 per [name]. And to call if he has any questions.

## 2015-02-14 NOTE — Telephone Encounter (Signed)
-----   Message from Annia Belt, MD sent at 02/13/2015 12:33 PM EST ----- Call pt: lab good. Hb 10.7

## 2015-02-20 ENCOUNTER — Ambulatory Visit (INDEPENDENT_AMBULATORY_CARE_PROVIDER_SITE_OTHER): Payer: Medicare Other | Admitting: Oncology

## 2015-02-20 ENCOUNTER — Encounter: Payer: Self-pay | Admitting: Oncology

## 2015-02-20 VITALS — BP 141/76 | HR 76 | Temp 97.4°F | Ht 73.0 in | Wt 198.7 lb

## 2015-02-20 DIAGNOSIS — R609 Edema, unspecified: Secondary | ICD-10-CM

## 2015-02-20 DIAGNOSIS — C61 Malignant neoplasm of prostate: Secondary | ICD-10-CM

## 2015-02-20 DIAGNOSIS — D46Z Other myelodysplastic syndromes: Secondary | ICD-10-CM

## 2015-02-20 DIAGNOSIS — D72821 Monocytosis (symptomatic): Secondary | ICD-10-CM | POA: Diagnosis not present

## 2015-02-20 DIAGNOSIS — D61818 Other pancytopenia: Secondary | ICD-10-CM | POA: Diagnosis not present

## 2015-02-20 DIAGNOSIS — D4620 Refractory anemia with excess of blasts, unspecified: Secondary | ICD-10-CM

## 2015-02-20 DIAGNOSIS — R600 Localized edema: Secondary | ICD-10-CM

## 2015-02-20 DIAGNOSIS — D472 Monoclonal gammopathy: Secondary | ICD-10-CM | POA: Diagnosis not present

## 2015-02-20 NOTE — Patient Instructions (Signed)
Continue every 3 week aranesp injections next 11/29 then 03/28/15 MD visit 2-3 months  lab on 04/19/15 1 week before MD visit

## 2015-02-20 NOTE — Progress Notes (Signed)
Patient ID: Joel Macdonald, male   DOB: 1931-03-31, 81 y.o.   MRN: [id number] Hematology and Oncology Follow Up Visit  Joel Macdonald [id number] 1930-06-17 81 y.o. 02/20/2015 11:27 AM   Principle Diagnosis: Encounter Diagnoses  Name Primary?  . Pancytopenia, acquired ([place])   . MDS (myelodysplastic syndrome), high grade ([place]) Yes  . MGUS (monoclonal gammopathy of unknown significance)   . Monocytosis   . ADENOCARCINOMA, PROSTATE, GLEASON GRADE 7   . 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 Bone marrow biopsy done 06/13/2011 showed changes of myelodysplasia with dyserythropoiesis and dysmegakaryopoiesis. There were some focal plasmacytoid infiltrates. He did have a borderline elevation of his serum IgM. There were no excess blasts. Cytogenetic studies were normal. Overall he was doing well until one year ago. He had a flulike illness in December 2014 with a persistent cough not responding to antibacterial antibiotics and the presence of oral ulcerations typical of a virus infection. He subsequently began to develop first right leg then left leg cellulitis and swelling. Over the next few months, he had a number of venous and arterial Doppler studies which failed to show any evidence for thrombosis. He was treated with multiple courses of antibiotics with minimal response and persistent, and progressive, brawny edema of his lower extremities. MRI of his right leg did not show any evidence for osteomyelitis. A routine urinalysis showed no protein and serum albumin initially normal with most recent value slightly decreased at 3.3 g. His performance status declined over the same interval with increasing malaise, and anorexia.  His internist began a trial of high-dose diuretics . Iinitially he lost some weight. An  echocardiogram was ordered to assess possibility of clot in his IVC. This showed a normal left ventricular ejection fraction. No significant valvular abnormalities. Normal flow in the IVC with no evidence of clot.  Incidentally noted on a follow-up venous Doppler study done on December 11 was right inguinal adenopathy. This was followed up with a CT scan of the abdomen and pelvis done on 03/25/2014 which showed borderline bilateral iliac chain adenopathy with largest node on the right side measuring 1.8 cm largest retroperitoneal node 0.9 cm. Spleen was not enlarged but did have some calcified granulomas. I did not feel that this degree of adenopathy was sufficient to explain his significant bilateral lower extremity edema. I obtained a 24 hour urine collection which did show proteinuria of 1 g. Serum albumin borderline decreased at 3.5 g. Renal function was normal until he began high-dose diuretics. Diuretics have been tapered & stopped. Renal function back to normal.  I was concerned that he developed some form of vasculitis when he developed ischemic changes of the digits of his toes bilaterally at time of my April 2015 exam. ESR elevated at 49 mm. Cryoglobulins not detected. ANCA screen negative.  I repeated a bone marrow aspiration and biopsy on 05/05/14 in view of sudden progression of anemia and increase in peripheral blood monocytes up to 50% of the differential. The bone marrow shows that his myelodysplastic syndrome has progressed. Blast count 7% of the differential by light microscopy although a distinct increased blast population not seen on flow cytometry. There is trilineage dysplasia. Minimal plasmacytosis unchanged from previous marrow. Cytogenetic studies have returned showing no gross abnormalities including FISH studies to look for deletions and chromosomes 5 and 7. In view of his  poor performance status and other comorbid medical conditions, I did not feel that he was a candidate for  chemotherapy with a hypomethylating agent such as 5-azacytidine or decitabine. I  elected to start him on Aranesp to see if we could get a red cell response since he had  become transfusion dependent for red cells. Aranesp 300 g every 3 week was started on 05/16/2014. He had a nice response to the Aranesp and his hemoglobin is now consistently at or above 10 g. I believe this is helped with his edema problems as well in view of improved oncotic pressure.  Interim History:   I evaluated him on August 12 when he had developed a subcutaneous abscess medial aspect left leg. Midway between ankle and knee. I supervised our chief resident. We did an incision and drainage procedure. We started him on oral antibiotics with cephalexin. Cultures came back showing penicillin sensitive Staphylococcus aureus. His internist got the report and changed him to Levaquin for 10 days. Another abscess popped up on his other leg and was also treated with Levaquin but did not require incision and drainage.  He is having more problems with low back pain. He has a known L1 and a T12 compression fracture. He is going to see Dr. Sherwood Macdonald, neurosurgery, tomorrow.  Other than above skin infections, no other signs or symptoms of infection. His peripheral edema has improved significantly concomitant with improvement in his hemoglobin and general nutrition with albumin now up to 4.4 g.  Hematologic parameters remain stable with Aranesp support. He has a persistent monocytosis but this has not deteriorated and there are no obvious signs of conversion to acute leukemia. Platelet count remained stable most recent 150,000 on 02/10/2015.  He is strongly considering moving to [facility] where his physician son is after the new year. Both he and his wife have health problems. She may need an aortic valve replacement.     Medications: reviewed  Allergies:  Allergies  Allergen Reactions  . Nsaids Other (See Comments)  . Other  Other (See Comments)    Ragweed causes nasal congestion    Review of Systems: See history of present illness Remaining ROS negative:   Physical Exam: Blood pressure 141/76, pulse 76, temperature 97.4 F (36.3 C), temperature source Oral, height 6' 1"  (1.854 m), weight 198 lb 11.2 oz (90.13 kg), SpO2 100 %. Wt Readings from Last 3 Encounters:  02/20/15 198 lb 11.2 oz (90.13 kg)  11/17/14 192 lb 1.6 oz (87.136 kg)  10/18/14 192 lb 12.8 oz (87.454 kg)     General appearance: better nourished Caucasian man HENNT: Pharynx no erythema, exudate, mass, or ulcer. No thyromegaly or thyroid nodules Lymph nodes: No cervical, supraclavicular, or axillary lymphadenopathy Breasts: Lungs: Clear to auscultation, resonant to percussion throughout Heart: Regular rhythm, no murmur, no gallop, no rub, no click, no edema Abdomen: Soft, nontender, normal bowel sounds, no mass, no organomegaly Extremities: No edema, no calf tenderness Musculoskeletal: no joint deformities GU:  Vascular: Carotid pulses 2+, no bruits, ischemic changes 1st & 3rd toes left foot; venous stasis changes extensive both calves right > left. Chronic but improved edema R > L 2+/1+ Neurologic: Alert, oriented, PERRLA,, cranial nerves grossly normal, motor strength 5 over 5, reflexes 1+ symmetric, upper body coordination normal, gait normal, Skin: senile purpura no change; resolved SQ abscess left calf. Raised, erythematous, nodular area dorsal surface right index finger.  Lab Results: CBC W/Diff    Component Value Date/Time   WBC 4.4 02/10/2015  1035   WBC 9.2 11/15/2014 1423   WBC 3.0* 05/17/2013 1258   RBC 3.17* 02/10/2015 1035   RBC 3.05* 11/15/2014 1423   RBC 3.13* 06/13/2014 1022   RBC 3.01* 05/17/2013 1258   HGB 10.3* 02/14/2015 1328   HGB 10.4* 05/17/2013 1258   HCT 32.8* 02/10/2015 1035   HCT 31.8* 11/15/2014 1423   HCT 31.5* 05/17/2013 1258   PLT 173 11/15/2014 1423   PLT 84* 05/17/2013 1258   MCV 104.3*  11/15/2014 1423   MCV 104.7* 05/17/2013 1258   MCH 33.8* 02/10/2015 1035   MCH 33.8 11/15/2014 1423   MCH 34.6* 05/17/2013 1258   MCHC 32.6 02/10/2015 1035   MCHC 32.4 11/15/2014 1423   MCHC 33.0 05/17/2013 1258   RDW 15.8* 02/10/2015 1035   RDW 15.1 11/15/2014 1423   RDW 16.0* 05/17/2013 1258   LYMPHSABS 1.4 02/10/2015 1035   LYMPHSABS 2.3 11/15/2014 1423   LYMPHSABS 1.5 05/17/2013 1258   MONOABS 3.6* 11/15/2014 1423   MONOABS 0.8 05/17/2013 1258   EOSABS 0.0 11/15/2014 1423   EOSABS 0.0 05/17/2013 1258   BASOSABS 0.0 02/10/2015 1035   BASOSABS 0.0 11/15/2014 1423   BASOSABS 0.0 05/17/2013 1258     Chemistry      Component Value Date/Time   NA 139 02/10/2015 1035   NA 136 11/15/2014 1423   NA 139 11/09/2012 1425   K 4.8 02/10/2015 1035   K 4.2 11/09/2012 1425   CL 101 02/10/2015 1035   CO2 22 02/10/2015 1035   CO2 24 11/09/2012 1425   BUN 20 02/10/2015 1035   BUN 13 11/15/2014 1423   BUN 22.4 11/09/2012 1425   CREATININE 0.83 02/10/2015 1035   CREATININE 0.88 10/17/2014 1101   CREATININE 1.39* 03/23/2014 0730   CREATININE 1.2 11/09/2012 1425      Component Value Date/Time   CALCIUM 9.2 02/10/2015 1035   CALCIUM 9.3 11/09/2012 1425   ALKPHOS 87 02/10/2015 1035   ALKPHOS 62 11/09/2012 1425   AST 18 02/10/2015 1035   AST 20 11/09/2012 1425   ALT 10 02/10/2015 1035   ALT 19 11/09/2012 1425   BILITOT 0.5 02/10/2015 1035   BILITOT 0.9 11/15/2014 1423   BILITOT 0.57 11/09/2012 1425       Radiological Studies: No results found.  Impression:  #1. Progression from low risk myelodysplastic syndrome diagnosed in February 2013 to high risk MDS as of bone marrow biopsy 05/05/2014. Trial of Aranesp started in February, 2016 is working. Hemoglobin as high as 10.2 g and he has now returned to transfusion independency. . Continue Aranesp every 3 weeks and continue to monitor blood counts closely.  #2. Idiopathic lymphedema, proteinuria, progressive anemia, low-grade,  nonbulky lymphadenopathy without splenomegaly. Improved with better nutrition, rise in albumin, and rise in hemoglobin.  #3. IgM monoclonal gammopathy of undetermined significance-no change over time. IgM level at baseline  #4. History of prostate cancer treated with primary surgery PSA remains undetectable as of most recent assay done 05/05/2014. He had a recent follow-up visit with his urologist. PSA not repeated.  #5. Recurrent lower extremity cellulitis secondary to edema and decreased white blood cell function from MDS. Stable at time of today's exam with no new areas of concern.  #6. Bilateral subcutaneous abscesses treated as outlined above. No active areas of concern on current exam. Suspect this is all due to white blood cell dysfunction from his underlying MDS.  #7. Unusual raised nodular erythematous area on his right index finger. Intermittent ischemic changes  of the distal extremities currently limited to the left foot. I believe these are all signs of an underlying vasculitis related to his MDS.  CC: Patient Care Team: Lajean Manes, MD as PCP - General (Internal Medicine) Annia Belt, MD as Consulting Physician (Oncology)   Annia Belt, MD 11/14/201611:27 AM

## 2015-03-07 ENCOUNTER — Encounter (HOSPITAL_COMMUNITY)
Admission: RE | Admit: 2015-03-07 | Discharge: 2015-03-07 | Disposition: A | Discharge status: Home / Self Care | Payer: Medicare Other | Source: Ambulatory Visit | Attending: Oncology | Admitting: Oncology

## 2015-03-07 DIAGNOSIS — D61818 Other pancytopenia: Secondary | ICD-10-CM

## 2015-03-07 DIAGNOSIS — D4620 Refractory anemia with excess of blasts, unspecified: Secondary | ICD-10-CM

## 2015-03-07 DIAGNOSIS — D46Z Other myelodysplastic syndromes: Secondary | ICD-10-CM | POA: Diagnosis not present

## 2015-03-07 DIAGNOSIS — D72821 Monocytosis (symptomatic): Secondary | ICD-10-CM

## 2015-03-07 LAB — POCT HEMOGLOBIN-HEMACUE: Hemoglobin: 10.1 g/dL — ABNORMAL LOW (ref 13.0–17.0)

## 2015-03-07 MED ORDER — DARBEPOETIN ALFA 200 MCG/0.4ML IJ SOSY
PREFILLED_SYRINGE | INTRAMUSCULAR | Status: AC
  Administered 2015-03-07: 400 ug
  Filled 2015-03-07: qty 0.8

## 2015-03-07 MED ORDER — DARBEPOETIN ALFA 100 MCG/0.5ML IJ SOSY
500.0000 ug | PREFILLED_SYRINGE | INTRAMUSCULAR | Status: DC
  Filled 2015-03-07: qty 2.5

## 2015-03-07 MED ORDER — DARBEPOETIN ALFA 100 MCG/0.5ML IJ SOSY
PREFILLED_SYRINGE | INTRAMUSCULAR | Status: AC
  Administered 2015-03-07: 100 ug
  Filled 2015-03-07: qty 0.5

## 2015-03-22 ENCOUNTER — Other Ambulatory Visit: Payer: Self-pay | Admitting: Neurosurgery

## 2015-03-22 DIAGNOSIS — E559 Vitamin D deficiency, unspecified: Secondary | ICD-10-CM

## 2015-03-28 ENCOUNTER — Encounter (HOSPITAL_COMMUNITY)
Admission: RE | Admit: 2015-03-28 | Discharge: 2015-03-28 | Disposition: A | Discharge status: Home / Self Care | Payer: Medicare Other | Source: Ambulatory Visit | Attending: Oncology | Admitting: Oncology

## 2015-03-28 DIAGNOSIS — D72821 Monocytosis (symptomatic): Secondary | ICD-10-CM | POA: Diagnosis present

## 2015-03-28 DIAGNOSIS — D469 Myelodysplastic syndrome, unspecified: Secondary | ICD-10-CM | POA: Diagnosis not present

## 2015-03-28 DIAGNOSIS — D619 Aplastic anemia, unspecified: Secondary | ICD-10-CM | POA: Diagnosis present

## 2015-03-28 DIAGNOSIS — D61818 Other pancytopenia: Secondary | ICD-10-CM | POA: Insufficient documentation

## 2015-03-28 DIAGNOSIS — D4620 Refractory anemia with excess of blasts, unspecified: Secondary | ICD-10-CM | POA: Insufficient documentation

## 2015-03-28 LAB — POCT HEMOGLOBIN-HEMACUE: HEMOGLOBIN: 11 g/dL — AB (ref 13.0–17.0)

## 2015-03-28 MED ORDER — DARBEPOETIN ALFA 100 MCG/0.5ML IJ SOSY
500.0000 ug | PREFILLED_SYRINGE | INTRAMUSCULAR | Status: DC
  Administered 2015-03-28: 100 ug via SUBCUTANEOUS
  Filled 2015-03-28: qty 2.5

## 2015-03-28 MED ORDER — DARBEPOETIN ALFA 200 MCG/0.4ML IJ SOSY
PREFILLED_SYRINGE | INTRAMUSCULAR | Status: AC
  Administered 2015-03-28: 400 ug
  Filled 2015-03-28: qty 0.8

## 2015-03-28 MED ORDER — DARBEPOETIN ALFA 100 MCG/0.5ML IJ SOSY
PREFILLED_SYRINGE | INTRAMUSCULAR | Status: AC
  Administered 2015-03-28: 100 ug via SUBCUTANEOUS
  Filled 2015-03-28: qty 0.5

## 2015-04-17 ENCOUNTER — Encounter (HOSPITAL_COMMUNITY)
Admission: RE | Admit: 2015-04-17 | Discharge: 2015-04-17 | Disposition: A | Discharge status: Home / Self Care | Payer: Medicare Other | Source: Ambulatory Visit | Attending: Oncology | Admitting: Oncology

## 2015-04-17 VITALS — BP 153/93 | HR 70 | Resp 20

## 2015-04-17 DIAGNOSIS — D46Z Other myelodysplastic syndromes: Secondary | ICD-10-CM | POA: Diagnosis not present

## 2015-04-17 DIAGNOSIS — D619 Aplastic anemia, unspecified: Secondary | ICD-10-CM | POA: Insufficient documentation

## 2015-04-17 DIAGNOSIS — D72821 Monocytosis (symptomatic): Secondary | ICD-10-CM | POA: Insufficient documentation

## 2015-04-17 DIAGNOSIS — D61818 Other pancytopenia: Secondary | ICD-10-CM

## 2015-04-17 DIAGNOSIS — D4620 Refractory anemia with excess of blasts, unspecified: Secondary | ICD-10-CM

## 2015-04-17 LAB — POCT HEMOGLOBIN-HEMACUE: Hemoglobin: 9.5 g/dL — ABNORMAL LOW (ref 13.0–17.0)

## 2015-04-17 MED ORDER — DARBEPOETIN ALFA 100 MCG/0.5ML IJ SOSY
PREFILLED_SYRINGE | INTRAMUSCULAR | Status: AC
  Administered 2015-04-17: 100 ug
  Filled 2015-04-17: qty 0.5

## 2015-04-17 MED ORDER — DARBEPOETIN ALFA 300 MCG/0.6ML IJ SOSY
300.0000 ug | PREFILLED_SYRINGE | INTRAMUSCULAR | Status: DC
  Filled 2015-04-17: qty 0.6

## 2015-04-17 MED ORDER — DARBEPOETIN ALFA 200 MCG/0.4ML IJ SOSY
PREFILLED_SYRINGE | INTRAMUSCULAR | Status: AC
  Administered 2015-04-17: 200 ug
  Filled 2015-04-17: qty 0.4

## 2015-04-18 ENCOUNTER — Encounter (HOSPITAL_COMMUNITY): Payer: Medicare Other

## 2015-04-18 ENCOUNTER — Encounter (HOSPITAL_BASED_OUTPATIENT_CLINIC_OR_DEPARTMENT_OTHER): Payer: Medicare Other | Attending: Internal Medicine

## 2015-04-18 DIAGNOSIS — I87331 Chronic venous hypertension (idiopathic) with ulcer and inflammation of right lower extremity: Secondary | ICD-10-CM | POA: Diagnosis present

## 2015-04-18 DIAGNOSIS — L97812 Non-pressure chronic ulcer of other part of right lower leg with fat layer exposed: Secondary | ICD-10-CM | POA: Insufficient documentation

## 2015-04-18 DIAGNOSIS — L97822 Non-pressure chronic ulcer of other part of left lower leg with fat layer exposed: Secondary | ICD-10-CM | POA: Insufficient documentation

## 2015-04-18 DIAGNOSIS — I890 Lymphedema, not elsewhere classified: Secondary | ICD-10-CM | POA: Insufficient documentation

## 2015-04-18 DIAGNOSIS — I872 Venous insufficiency (chronic) (peripheral): Secondary | ICD-10-CM | POA: Diagnosis not present

## 2015-04-18 DIAGNOSIS — M1990 Unspecified osteoarthritis, unspecified site: Secondary | ICD-10-CM | POA: Diagnosis not present

## 2015-04-18 DIAGNOSIS — L989 Disorder of the skin and subcutaneous tissue, unspecified: Secondary | ICD-10-CM | POA: Insufficient documentation

## 2015-04-18 DIAGNOSIS — I776 Arteritis, unspecified: Secondary | ICD-10-CM | POA: Diagnosis not present

## 2015-04-18 DIAGNOSIS — A4901 Methicillin susceptible Staphylococcus aureus infection, unspecified site: Secondary | ICD-10-CM | POA: Insufficient documentation

## 2015-04-24 DIAGNOSIS — I87331 Chronic venous hypertension (idiopathic) with ulcer and inflammation of right lower extremity: Secondary | ICD-10-CM | POA: Diagnosis not present

## 2015-04-25 ENCOUNTER — Ambulatory Visit
Admission: RE | Admit: 2015-04-25 | Discharge: 2015-04-25 | Disposition: A | Discharge status: Home / Self Care | Payer: Medicare Other | Source: Ambulatory Visit | Attending: Neurosurgery | Admitting: Neurosurgery

## 2015-04-25 DIAGNOSIS — E559 Vitamin D deficiency, unspecified: Secondary | ICD-10-CM

## 2015-04-28 DIAGNOSIS — I87331 Chronic venous hypertension (idiopathic) with ulcer and inflammation of right lower extremity: Secondary | ICD-10-CM | POA: Diagnosis not present

## 2015-05-05 DIAGNOSIS — I87331 Chronic venous hypertension (idiopathic) with ulcer and inflammation of right lower extremity: Secondary | ICD-10-CM | POA: Diagnosis not present

## 2015-05-08 ENCOUNTER — Encounter (HOSPITAL_COMMUNITY)
Admission: RE | Admit: 2015-05-08 | Discharge: 2015-05-08 | Disposition: A | Discharge status: Home / Self Care | Payer: Medicare Other | Source: Ambulatory Visit | Attending: Oncology | Admitting: Oncology

## 2015-05-08 ENCOUNTER — Other Ambulatory Visit: Payer: Self-pay | Admitting: Internal Medicine

## 2015-05-08 ENCOUNTER — Other Ambulatory Visit (HOSPITAL_BASED_OUTPATIENT_CLINIC_OR_DEPARTMENT_OTHER): Payer: Self-pay | Admitting: Internal Medicine

## 2015-05-08 VITALS — BP 168/81 | HR 73 | Resp 20

## 2015-05-08 DIAGNOSIS — L97919 Non-pressure chronic ulcer of unspecified part of right lower leg with unspecified severity: Principal | ICD-10-CM

## 2015-05-08 DIAGNOSIS — D72821 Monocytosis (symptomatic): Secondary | ICD-10-CM

## 2015-05-08 DIAGNOSIS — D46Z Other myelodysplastic syndromes: Secondary | ICD-10-CM | POA: Diagnosis not present

## 2015-05-08 DIAGNOSIS — I87331 Chronic venous hypertension (idiopathic) with ulcer and inflammation of right lower extremity: Secondary | ICD-10-CM

## 2015-05-08 DIAGNOSIS — D4620 Refractory anemia with excess of blasts, unspecified: Secondary | ICD-10-CM

## 2015-05-08 DIAGNOSIS — D61818 Other pancytopenia: Secondary | ICD-10-CM

## 2015-05-08 LAB — POCT HEMOGLOBIN-HEMACUE: Hemoglobin: 9.7 g/dL — ABNORMAL LOW (ref 13.0–17.0)

## 2015-05-08 MED ORDER — DARBEPOETIN ALFA 150 MCG/0.3ML IJ SOSY
PREFILLED_SYRINGE | INTRAMUSCULAR | Status: AC
  Filled 2015-05-08: qty 0.6

## 2015-05-08 MED ORDER — DARBEPOETIN ALFA 300 MCG/0.6ML IJ SOSY
300.0000 ug | PREFILLED_SYRINGE | INTRAMUSCULAR | Status: DC
  Administered 2015-05-08: 300 ug via SUBCUTANEOUS

## 2015-05-09 ENCOUNTER — Other Ambulatory Visit (INDEPENDENT_AMBULATORY_CARE_PROVIDER_SITE_OTHER): Payer: Medicare Other

## 2015-05-09 DIAGNOSIS — D72821 Monocytosis (symptomatic): Secondary | ICD-10-CM | POA: Diagnosis not present

## 2015-05-09 DIAGNOSIS — D61818 Other pancytopenia: Secondary | ICD-10-CM

## 2015-05-09 DIAGNOSIS — D472 Monoclonal gammopathy: Secondary | ICD-10-CM | POA: Diagnosis not present

## 2015-05-09 DIAGNOSIS — D46Z Other myelodysplastic syndromes: Secondary | ICD-10-CM

## 2015-05-09 LAB — SAVE SMEAR

## 2015-05-10 ENCOUNTER — Ambulatory Visit
Admission: RE | Admit: 2015-05-10 | Discharge: 2015-05-10 | Disposition: A | Discharge status: Home / Self Care | Payer: Medicare Other | Source: Ambulatory Visit | Attending: Internal Medicine | Admitting: Internal Medicine

## 2015-05-10 DIAGNOSIS — L97919 Non-pressure chronic ulcer of unspecified part of right lower leg with unspecified severity: Principal | ICD-10-CM

## 2015-05-10 DIAGNOSIS — I87331 Chronic venous hypertension (idiopathic) with ulcer and inflammation of right lower extremity: Secondary | ICD-10-CM

## 2015-05-10 LAB — CBC WITH DIFFERENTIAL/PLATELET
BASOS ABS: 0 10*3/uL (ref 0.0–0.2)
BASOS: 0 %
EOS (ABSOLUTE): 0 10*3/uL (ref 0.0–0.4)
Eos: 1 %
HEMATOCRIT: 28.9 % — AB (ref 37.5–51.0)
Hemoglobin: 9.5 g/dL — ABNORMAL LOW (ref 12.6–17.7)
IMMATURE GRANULOCYTES: 2 %
Immature Grans (Abs): 0.1 10*3/uL (ref 0.0–0.1)
LYMPHS: 32 %
Lymphocytes Absolute: 1.7 10*3/uL (ref 0.7–3.1)
MCH: 33.5 pg — ABNORMAL HIGH (ref 26.6–33.0)
MCHC: 32.9 g/dL (ref 31.5–35.7)
MCV: 102 fL — ABNORMAL HIGH (ref 79–97)
Monocytes Absolute: 2.3 10*3/uL — ABNORMAL HIGH (ref 0.1–0.9)
Monocytes: 43 %
NEUTROS PCT: 22 %
Neutrophils Absolute: 1.1 10*3/uL — ABNORMAL LOW (ref 1.4–7.0)
PLATELETS: 154 10*3/uL (ref 150–379)
RBC: 2.84 x10E6/uL — AB (ref 4.14–5.80)
RDW: 16.4 % — AB (ref 12.3–15.4)
WBC: 5.3 10*3/uL (ref 3.4–10.8)

## 2015-05-10 LAB — COMPREHENSIVE METABOLIC PANEL
ALT: 8 IU/L (ref 0–44)
AST: 14 IU/L (ref 0–40)
Albumin/Globulin Ratio: 1.5 (ref 1.1–2.5)
Albumin: 4 g/dL (ref 3.5–4.7)
Alkaline Phosphatase: 75 IU/L (ref 39–117)
BUN/Creatinine Ratio: 29 — ABNORMAL HIGH (ref 10–22)
BUN: 24 mg/dL (ref 8–27)
Bilirubin Total: 0.4 mg/dL (ref 0.0–1.2)
CALCIUM: 8.6 mg/dL (ref 8.6–10.2)
CO2: 23 mmol/L (ref 18–29)
CREATININE: 0.84 mg/dL (ref 0.76–1.27)
Chloride: 101 mmol/L (ref 96–106)
GFR calc Af Amer: 94 mL/min/{1.73_m2} (ref >59)
GFR, EST NON AFRICAN AMERICAN: 82 mL/min/{1.73_m2} (ref >59)
GLUCOSE: 78 mg/dL (ref 65–99)
Globulin, Total: 2.7 g/dL (ref 1.5–4.5)
Potassium: 4.2 mmol/L (ref 3.5–5.2)
Sodium: 139 mmol/L (ref 134–144)
Total Protein: 6.7 g/dL (ref 6.0–8.5)

## 2015-05-10 LAB — IGG, IGA, IGM
IGA/IMMUNOGLOBULIN A, SERUM: 276 mg/dL (ref 61–437)
IGG (IMMUNOGLOBIN G), SERUM: 893 mg/dL (ref 700–1600)
IgM (Immunoglobulin M), Srm: 379 mg/dL — ABNORMAL HIGH (ref 15–143)

## 2015-05-10 LAB — LACTATE DEHYDROGENASE: LDH: 198 IU/L (ref 121–224)

## 2015-05-10 MED ORDER — IOPAMIDOL (ISOVUE-300) INJECTION 61%
100.0000 mL | Freq: Once | INTRAVENOUS | Status: AC | PRN
  Administered 2015-05-10: 100 mL via INTRAVENOUS

## 2015-05-10 NOTE — Progress Notes (Signed)
Results faxed to [facility] per patient's request. CT scan scheduled 05-10-2015. [id number] 05-10-2015 [id number]  Maryan Rued, PBT

## 2015-05-12 ENCOUNTER — Encounter (HOSPITAL_COMMUNITY): Payer: Self-pay | Admitting: General Practice

## 2015-05-12 ENCOUNTER — Encounter (HOSPITAL_BASED_OUTPATIENT_CLINIC_OR_DEPARTMENT_OTHER): Payer: Medicare Other | Attending: Internal Medicine

## 2015-05-12 ENCOUNTER — Inpatient Hospital Stay (HOSPITAL_COMMUNITY)
Admission: AD | Admit: 2015-05-12 | Discharge: 2015-05-19 | DRG: 574 | Disposition: A | Discharge status: Home / Self Care | Payer: Medicare Other | Source: Ambulatory Visit | Attending: Internal Medicine | Admitting: Internal Medicine

## 2015-05-12 DIAGNOSIS — R600 Localized edema: Secondary | ICD-10-CM | POA: Diagnosis present

## 2015-05-12 DIAGNOSIS — I872 Venous insufficiency (chronic) (peripheral): Secondary | ICD-10-CM | POA: Diagnosis present

## 2015-05-12 DIAGNOSIS — E039 Hypothyroidism, unspecified: Secondary | ICD-10-CM | POA: Diagnosis present

## 2015-05-12 DIAGNOSIS — Z8546 Personal history of malignant neoplasm of prostate: Secondary | ICD-10-CM | POA: Diagnosis not present

## 2015-05-12 DIAGNOSIS — I87331 Chronic venous hypertension (idiopathic) with ulcer and inflammation of right lower extremity: Secondary | ICD-10-CM | POA: Insufficient documentation

## 2015-05-12 DIAGNOSIS — B952 Enterococcus as the cause of diseases classified elsewhere: Secondary | ICD-10-CM | POA: Diagnosis present

## 2015-05-12 DIAGNOSIS — D472 Monoclonal gammopathy: Secondary | ICD-10-CM | POA: Diagnosis present

## 2015-05-12 DIAGNOSIS — Z886 Allergy status to analgesic agent status: Secondary | ICD-10-CM | POA: Diagnosis not present

## 2015-05-12 DIAGNOSIS — D692 Other nonthrombocytopenic purpura: Secondary | ICD-10-CM | POA: Diagnosis not present

## 2015-05-12 DIAGNOSIS — D72821 Monocytosis (symptomatic): Secondary | ICD-10-CM | POA: Diagnosis present

## 2015-05-12 DIAGNOSIS — B9689 Other specified bacterial agents as the cause of diseases classified elsewhere: Secondary | ICD-10-CM

## 2015-05-12 DIAGNOSIS — L0291 Cutaneous abscess, unspecified: Secondary | ICD-10-CM | POA: Diagnosis present

## 2015-05-12 DIAGNOSIS — Z9889 Other specified postprocedural states: Secondary | ICD-10-CM | POA: Diagnosis not present

## 2015-05-12 DIAGNOSIS — D46Z Other myelodysplastic syndromes: Secondary | ICD-10-CM | POA: Diagnosis present

## 2015-05-12 DIAGNOSIS — Z881 Allergy status to other antibiotic agents status: Secondary | ICD-10-CM

## 2015-05-12 DIAGNOSIS — L02419 Cutaneous abscess of limb, unspecified: Secondary | ICD-10-CM | POA: Diagnosis not present

## 2015-05-12 DIAGNOSIS — L03119 Cellulitis of unspecified part of limb: Secondary | ICD-10-CM

## 2015-05-12 DIAGNOSIS — I890 Lymphedema, not elsewhere classified: Secondary | ICD-10-CM | POA: Insufficient documentation

## 2015-05-12 DIAGNOSIS — L02415 Cutaneous abscess of right lower limb: Secondary | ICD-10-CM | POA: Diagnosis not present

## 2015-05-12 DIAGNOSIS — Z9079 Acquired absence of other genital organ(s): Secondary | ICD-10-CM | POA: Diagnosis not present

## 2015-05-12 DIAGNOSIS — L97319 Non-pressure chronic ulcer of right ankle with unspecified severity: Secondary | ICD-10-CM | POA: Diagnosis not present

## 2015-05-12 DIAGNOSIS — D61818 Other pancytopenia: Secondary | ICD-10-CM | POA: Diagnosis present

## 2015-05-12 DIAGNOSIS — A498 Other bacterial infections of unspecified site: Secondary | ICD-10-CM | POA: Insufficient documentation

## 2015-05-12 DIAGNOSIS — L02416 Cutaneous abscess of left lower limb: Secondary | ICD-10-CM | POA: Diagnosis not present

## 2015-05-12 DIAGNOSIS — K219 Gastro-esophageal reflux disease without esophagitis: Secondary | ICD-10-CM | POA: Diagnosis present

## 2015-05-12 DIAGNOSIS — Z9109 Other allergy status, other than to drugs and biological substances: Secondary | ICD-10-CM | POA: Diagnosis not present

## 2015-05-12 DIAGNOSIS — Z87891 Personal history of nicotine dependence: Secondary | ICD-10-CM

## 2015-05-12 DIAGNOSIS — L97811 Non-pressure chronic ulcer of other part of right lower leg limited to breakdown of skin: Secondary | ICD-10-CM | POA: Insufficient documentation

## 2015-05-12 DIAGNOSIS — L03115 Cellulitis of right lower limb: Secondary | ICD-10-CM | POA: Diagnosis present

## 2015-05-12 DIAGNOSIS — Z79899 Other long term (current) drug therapy: Secondary | ICD-10-CM

## 2015-05-12 DIAGNOSIS — L03116 Cellulitis of left lower limb: Secondary | ICD-10-CM | POA: Diagnosis not present

## 2015-05-12 DIAGNOSIS — I739 Peripheral vascular disease, unspecified: Secondary | ICD-10-CM | POA: Diagnosis not present

## 2015-05-12 DIAGNOSIS — D4620 Refractory anemia with excess of blasts, unspecified: Secondary | ICD-10-CM | POA: Diagnosis not present

## 2015-05-12 DIAGNOSIS — M1990 Unspecified osteoarthritis, unspecified site: Secondary | ICD-10-CM | POA: Insufficient documentation

## 2015-05-12 HISTORY — DX: Cutaneous abscess of limb, unspecified: L02.419

## 2015-05-12 HISTORY — DX: Cellulitis of unspecified part of limb: L03.119

## 2015-05-12 LAB — CBC WITH DIFFERENTIAL/PLATELET
BASOS ABS: 0 10*3/uL (ref 0.0–0.1)
Basophils Relative: 0 %
EOS ABS: 0 10*3/uL (ref 0.0–0.7)
Eosinophils Relative: 0 %
HEMATOCRIT: 27.6 % — AB (ref 39.0–52.0)
HEMOGLOBIN: 8.9 g/dL — AB (ref 13.0–17.0)
LYMPHS PCT: 19 %
Lymphs Abs: 1.1 10*3/uL (ref 0.7–4.0)
MCH: 33.6 pg (ref 26.0–34.0)
MCHC: 32.2 g/dL (ref 30.0–36.0)
MCV: 104.2 fL — ABNORMAL HIGH (ref 78.0–100.0)
MONO ABS: 2.7 10*3/uL — AB (ref 0.1–1.0)
Monocytes Relative: 47 %
NEUTROS PCT: 34 %
Neutro Abs: 2 10*3/uL (ref 1.7–7.7)
Platelets: 134 10*3/uL — ABNORMAL LOW (ref 150–400)
RBC: 2.65 MIL/uL — ABNORMAL LOW (ref 4.22–5.81)
RDW: 16.4 % — ABNORMAL HIGH (ref 11.5–15.5)
WBC: 5.8 10*3/uL (ref 4.0–10.5)

## 2015-05-12 LAB — COMPREHENSIVE METABOLIC PANEL
ALK PHOS: 68 U/L (ref 38–126)
ALT: 11 U/L — ABNORMAL LOW (ref 17–63)
ANION GAP: 9 (ref 5–15)
AST: 16 U/L (ref 15–41)
Albumin: 3.6 g/dL (ref 3.5–5.0)
BILIRUBIN TOTAL: 0.5 mg/dL (ref 0.3–1.2)
BUN: 18 mg/dL (ref 6–20)
CALCIUM: 8.6 mg/dL — AB (ref 8.9–10.3)
CO2: 25 mmol/L (ref 22–32)
Chloride: 106 mmol/L (ref 101–111)
Creatinine, Ser: 0.99 mg/dL (ref 0.61–1.24)
Glucose, Bld: 107 mg/dL — ABNORMAL HIGH (ref 65–99)
Potassium: 3.8 mmol/L (ref 3.5–5.1)
SODIUM: 140 mmol/L (ref 135–145)
TOTAL PROTEIN: 6.8 g/dL (ref 6.5–8.1)

## 2015-05-12 MED ORDER — PIPERACILLIN-TAZOBACTAM 3.375 G IVPB
3.3750 g | Freq: Three times a day (TID) | INTRAVENOUS | Status: DC
  Administered 2015-05-12 – 2015-05-16 (×11): 3.375 g via INTRAVENOUS
  Filled 2015-05-12 (×15): qty 50

## 2015-05-12 MED ORDER — ONDANSETRON HCL 4 MG PO TABS: 4.0000 mg | ORAL_TABLET | Freq: Four times a day (QID) | ORAL | Status: DC | PRN

## 2015-05-12 MED ORDER — VANCOMYCIN HCL IN DEXTROSE 750-5 MG/150ML-% IV SOLN
750.0000 mg | Freq: Two times a day (BID) | INTRAVENOUS | Status: DC
  Administered 2015-05-13 – 2015-05-15 (×5): 750 mg via INTRAVENOUS
  Filled 2015-05-12 (×5): qty 150

## 2015-05-12 MED ORDER — SODIUM CHLORIDE 0.9 % IV SOLN: 250.0000 mL | INTRAVENOUS | Status: DC | PRN

## 2015-05-12 MED ORDER — CLOBETASOL PROPIONATE 0.05 % EX OINT
1.0000 "application " | TOPICAL_OINTMENT | Freq: Two times a day (BID) | CUTANEOUS | Status: DC
  Administered 2015-05-12: 1 via TOPICAL
  Filled 2015-05-12: qty 15

## 2015-05-12 MED ORDER — PIPERACILLIN-TAZOBACTAM 3.375 G IVPB 30 MIN
3.3750 g | Freq: Once | INTRAVENOUS | Status: AC
Start: 2015-05-12 — End: 2015-05-12
  Administered 2015-05-12: 3.375 g via INTRAVENOUS
  Filled 2015-05-12: qty 50

## 2015-05-12 MED ORDER — SODIUM CHLORIDE 0.9% FLUSH
3.0000 mL | Freq: Two times a day (BID) | INTRAVENOUS | Status: DC
  Administered 2015-05-12 – 2015-05-14 (×3): 3 mL via INTRAVENOUS
  Administered 2015-05-17 – 2015-05-18 (×2): 10 mL via INTRAVENOUS

## 2015-05-12 MED ORDER — VANCOMYCIN HCL IN DEXTROSE 1-5 GM/200ML-% IV SOLN
1000.0000 mg | Freq: Once | INTRAVENOUS | Status: AC
  Administered 2015-05-12: 1000 mg via INTRAVENOUS
  Filled 2015-05-12: qty 200

## 2015-05-12 MED ORDER — MORPHINE SULFATE (PF) 2 MG/ML IV SOLN: 2.0000 mg | INTRAVENOUS | Status: DC | PRN

## 2015-05-12 MED ORDER — HYDROCODONE-ACETAMINOPHEN 5-325 MG PO TABS
1.0000 | ORAL_TABLET | ORAL | Status: DC | PRN
  Filled 2015-05-12: qty 2

## 2015-05-12 MED ORDER — FAMOTIDINE 20 MG PO TABS: 10.0000 mg | ORAL_TABLET | Freq: Every day | ORAL | Status: DC | PRN

## 2015-05-12 MED ORDER — SODIUM CHLORIDE 0.9% FLUSH: 3.0000 mL | INTRAVENOUS | Status: DC | PRN

## 2015-05-12 MED ORDER — ACETAMINOPHEN 325 MG PO TABS
650.0000 mg | ORAL_TABLET | Freq: Four times a day (QID) | ORAL | Status: DC | PRN
  Administered 2015-05-12 – 2015-05-13 (×3): 650 mg via ORAL
  Filled 2015-05-12 (×3): qty 2

## 2015-05-12 MED ORDER — LEVOTHYROXINE SODIUM 50 MCG PO TABS
50.0000 ug | ORAL_TABLET | Freq: Every day | ORAL | Status: DC
  Administered 2015-05-14 – 2015-05-19 (×6): 50 ug via ORAL
  Filled 2015-05-12 (×6): qty 1

## 2015-05-12 MED ORDER — ONDANSETRON HCL 4 MG/2ML IJ SOLN
4.0000 mg | Freq: Four times a day (QID) | INTRAMUSCULAR | Status: DC | PRN
Start: 2015-05-12 — End: 2015-05-13

## 2015-05-12 MED ORDER — ACETAMINOPHEN 650 MG RE SUPP
650.0000 mg | Freq: Four times a day (QID) | RECTAL | Status: DC | PRN
Start: 2015-05-12 — End: 2015-05-13

## 2015-05-12 MED ORDER — ENOXAPARIN SODIUM 40 MG/0.4ML ~~LOC~~ SOLN
40.0000 mg | SUBCUTANEOUS | Status: DC
  Administered 2015-05-12 – 2015-05-18 (×7): 40 mg via SUBCUTANEOUS
  Filled 2015-05-12 (×6): qty 0.4

## 2015-05-12 NOTE — Progress Notes (Signed)
Pharmacy Antibiotic Note Joel Macdonald is a 82 y.o. male admitted on 05/12/2015 with right lower extremity cellulitis and abscess. Pharmacy has been consulted for Zosyn and Vancomycin dosing.  Plan: 1. Zosyn 3.375 grams over 30 minutes followed by EI Zosyn 3.375 grams Q8 hours infused over 4 hours 2. Vancomycin IV 750 mg Q 12 hours  3. If continued will obtain vancomycin trough at [facility]; goal 15-20 4. Await pending micro data and narrow abx as feasible (hx of MSSA) 5. Following along with you daily   Height: 6' (182.9 cm) Weight: 195 lb 5.2 oz (88.6 kg) IBW/kg (Calculated) : 77.6  Temp (24hrs), Avg:98.1 F (36.7 C), Min:98.1 F (36.7 C), Max:98.1 F (36.7 C)   Recent Labs Lab 05/09/15 1027  WBC 5.3  CREATININE 0.84    Antimicrobials this admission: 2/3 Zosyn >>  2/3 Vancomycin >>   Dose adjustments this admission: n/a  Microbiology results: 2/3 BCx: px 11/17/14: wound cx: MSSA   Thank you for allowing pharmacy to be a part of this patient's care.  Vincenza Hews, PharmD, BCPS 05/12/2015, 2:06 PM Pager: [phone]

## 2015-05-12 NOTE — Plan of Care (Signed)
Direct admit from Dr. Dellia Nims at wound care.   This is an 82 year old male with a history of myelodysplasia, prostate cancer, chronic lower extremity wounds and venous insufficiency, that presented to wound care today. Patient was recently diagnosed with MSSA wound 5-6 weeks ago and was placed on Levaquin. His wound initially had improved however returned. Patient was then placed on Keflex and clindamycin. CT CT scan of the right lower extremity on 05/10/2015 did show superficial collection of gas and fluid, suspicious for abscess. Currently on the hospital, vital signs are stable. Patient accepted for admission for IV antibiotics and surgical consult.  Time spent: 10 minutes  Joel Macdonald D.O. [facility] Pager [phone]  If 7PM-7AM, please contact night-coverage [email or web address] Password [facility] 05/12/2015, 9:44 AM

## 2015-05-12 NOTE — H&P (Signed)
[facility] History and Physical  Joel Macdonald [id number] DOB: July 12, 1930 DOA: 05/12/2015  Referring physician: Dr. Dellia Nims, wound care PCP: Mathews Argyle, MD  Specialists: Dr. Beryle Beams  Chief Complaint: Wound/abscess  HPI: Joel Macdonald is a 82 y.o. male  With a history of myelodysplastic syndrome, GERD, prostate cancer, presented to the hospital as a direct admission from Dr. Janalyn Rouse office (wound care) for nonhealing and worsening abscess. It seems the patient has had this right lower extremity abscess/cellulitis and wound since November of this past year, although in August 2016, wound culture showed MSSA. Patient was initially placed on doxycycline however then transitioned to Levaquin most recently Keflex and clindamycin. Unfortunately, patient does have chronic venous insufficiency with poor wound healing. Patient was sent for admission for IV antibiotics and surgical intervention. Patient did have CT scan on 05/10/2015 which did show a superficial abscess.  Review of Systems:  Constitutional: Denies fever, chills, diaphoresis, appetite change and fatigue.  HEENT: Denies photophobia, eye pain, redness, hearing loss, ear pain, congestion, sore throat, rhinorrhea, sneezing, mouth sores, trouble swallowing, neck pain, neck stiffness and tinnitus.   Respiratory: Denies SOB, DOE, cough, chest tightness,  and wheezing.   Cardiovascular: Denies chest pain, palpitations and leg swelling.  Gastrointestinal: Denies nausea, vomiting, abdominal pain, diarrhea, constipation, blood in stool and abdominal distention.  Genitourinary: Denies dysuria, urgency, frequency, hematuria, flank pain and difficulty urinating.  Musculoskeletal: Denies myalgias, back pain, joint swelling, arthralgias and gait problem.  Skin: LE wound Neurological: Denies dizziness, seizures, syncope, weakness, light-headedness, numbness and headaches.  Hematological: Denies adenopathy. Easy bruising,  personal or family bleeding history  Psychiatric/Behavioral: Denies suicidal ideation, mood changes, confusion, nervousness, sleep disturbance and agitation  Past Medical History  Diagnosis Date  . MGUS (monoclonal gammopathy of unknown significance) 06/17/2011  . Cellulitis of leg, right 07/12/2013  . Ischemia of extremity 07/12/2013    Bilateral toes  2,3,4 right; 1,3 left  Vascular doppler normal for large vessel occlusion 2/15  . Heart murmur 1936  . Pancytopenia, acquired ([place]) 06/17/2011    "from my Myelodysplastic syndrome"  . GERD (gastroesophageal reflux disease)   . Osteoarthritis     "back, legs, arms, shoulders" (07/12/2013)  . MDS (myelodysplastic syndrome), low grade ([place]) 09/20/2011    Bone marrow bx 06/13/11: mild dyserythro/dymegakaryopoiesis.  Normal cytogenetics.  FISH negative for chrom 5 or 7 deletions.  Scattered small infiltrates of plasmacytoid lymphs  . Prostate cancer ([place])   . Edema, peripheral 03/21/2014  . Monocytosis 03/21/2014  . MDS (myelodysplastic syndrome), high grade ([place]) 06/13/2014    7% blasts BM bx 05/05/14  . Senile purpura ([place]) 10/18/2014  . Abscess of skin 11/17/2014    Left ankle: incise & drain 11/17/14   Past Surgical History  Procedure Laterality Date  . Tonsillectomy and adenoidectomy  ~ 1939  . Robot assisted laparoscopic radical prostatectomy  ~ 2010  . Cataract extraction w/ intraocular lens  implant, bilateral Bilateral ~ 2012   Social History:  reports that he quit smoking about 21 years ago. His smoking use included Pipe. He has never used smokeless tobacco. He reports that he drinks alcohol. He reports that he does not use illicit drugs.   Allergies  Allergen Reactions  . Nsaids Other (See Comments)  . Azithromycin Nausea And Vomiting  . Other Other (See Comments)    Ragweed causes nasal congestion    Family History  Problem Relation Age of Onset  . Other Mother   . Other Father   . Heart  disease Father   . Hyperlipidemia Son      Prior to Admission medications   Medication Sig Start Date End Date Taking? Authorizing Provider  aspirin-sod bicarb-citric acid (ALKA-SELTZER) 325 MG TBEF Take 650 mg by mouth every 6 (six) hours as needed (sore thoart).     Historical Provider, MD  clobetasol ointment (TEMOVATE) [id number] % Apply 1 application topically 2 (two) times daily. Apply to psoriasis 06/18/11   Historical Provider, MD  FUROSEMIDE PO Take by mouth.    Historical Provider, MD  ibuprofen (ADVIL,MOTRIN) 200 MG tablet Take 400 mg by mouth every 6 (six) hours as needed for mild pain. Takes 400-600mg     Historical Provider, MD  levothyroxine (SYNTHROID, LEVOTHROID) 50 MCG tablet Take 50 mcg by mouth daily before breakfast.    Hal Stoneking, MD  ranitidine (ZANTAC) 150 MG tablet Take 150 mg by mouth as needed for heartburn.     Historical Provider, MD  triamcinolone cream (KENALOG) 0.1 %  01/25/14   Historical Provider, MD   Physical Exam: Filed Vitals:   05/12/15 1217  BP: 138/63  Pulse: 111  Temp: 98.1 F (36.7 C)  Resp: 14     General: Well developed, well nourished, NAD, appears stated age  HEENT: NCAT, PERRLA, EOMI, Anicteic Sclera, mucous membranes moist.   Neck: Supple, no JVD, no masses  Cardiovascular: S1 S2 auscultated, 1/6 SEM. Regular rate and rhythm.  Respiratory: Clear to auscultation bilaterally with equal chest rise  Abdomen: Soft, nontender, nondistended, + bowel sounds  Extremities: warm dry without cyanosis clubbing. +LE edema R>L  Neuro: AAOx3, cranial nerves grossly intact. Strength 5/5 in patient's upper and lower extremities bilaterally  Skin: RLE cellulitis, draining abscess. Bandage on LLE  Psych: Normal affect and demeanor with intact judgement and insight  Labs on Admission:  Basic Metabolic Panel:  Recent Labs Lab 05/09/15 1027  NA 139  K 4.2  CL 101  CO2 23  GLUCOSE 78  BUN 24  CREATININE 0.84  CALCIUM 8.6   Liver Function Tests:  Recent Labs Lab  05/09/15 1027  AST 14  ALT 8  ALKPHOS 75  BILITOT 0.4  PROT 6.7  ALBUMIN 4.0   No results for input(s): LIPASE, AMYLASE in the last 168 hours. No results for input(s): AMMONIA in the last 168 hours. CBC:  Recent Labs Lab 05/08/15 1210 05/09/15 1027  WBC  --  5.3  NEUTROABS  --  1.1*  HGB 9.7*  --   HCT  --  28.9*  MCV  --  102*  PLT  --  154   Cardiac Enzymes: No results for input(s): CKTOTAL, CKMB, CKMBINDEX, TROPONINI in the last 168 hours.  BNP (last 3 results) No results for input(s): BNP in the last 8760 hours.  ProBNP (last 3 results) No results for input(s): PROBNP in the last 8760 hours.  CBG: No results for input(s): GLUCAP in the last 168 hours.  Radiological Exams on Admission: Ct Tibia Fibula Right W Contrast  05/10/2015  CLINICAL DATA:  Evaluate for recurrent right lower leg abscess. History of bilateral leg abscesses with debridement 2 weeks ago. History of myelodysplastic syndrome and prostate cancer. EXAM: CT OF THE LOWER RIGHT EXTREMITY WITH CONTRAST TECHNIQUE: Multidetector CT imaging of the right lower leg was performed according to the standard protocol following intravenous contrast administration. COMPARISON:  None. CONTRAST:  168mL ISOVUE-300 IOPAMIDOL (ISOVUE-300) INJECTION 61% FINDINGS: There is an area of skin irregularity posterolaterally in the distal third of the right lower leg, presumably  at the site of recent surgical debridement. Within the underlying subcutaneous fat, there is a complex ill-defined collection of fluid and gas. This measures up to 3.4 x 1.5 cm on axial image 135 and extends approximately 3.9 cm cephalocaudad. No foreign bodies are apparent in this area. The inflammatory changes appear confined to the subcutaneous fat. The underlying muscles demonstrate normal enhancement and no focal fluid collection. There is generalized subcutaneous edema throughout the distal lower leg. No other focal fluid collections are seen. There are some  muscular venous collateral vessels. Mild popliteal arterial atherosclerosis noted. There is no evidence of acute fracture, dislocation or bone destruction. Relatively mild degenerative changes are present at the knee and ankle. No significant joint effusions seen. IMPRESSION: 1. Superficial collection of gas and fluid in the subcutaneous fat of the distal lower leg posterolaterally, suspicious for residual abscess status post recent debridement. No involvement of the underlying musculature identified. 2. No evidence of osteomyelitis or acute osseous findings. 3. These results will be called to the ordering clinician or representative by the Radiologist Assistant, and communication documented in the PACS or zVision Dashboard. Electronically Signed   By: Richardean Sale M.D.   On: 05/10/2015 16:20    EKG: None  Assessment/Plan  Right Lower Extremity abscess/cellulitis -Patient directly admitted  -Patient has been on several antibiotics, including doxycycline, levaquin, keflex, and most recently clindamycin -Wound culture recently showed MSSA on 11/17/2014 -Will obtain CBC and CMP -Will obtain wound culture if possible -CT right tibia/fibula on 05/10/2015 shows superficial collection of gas and fluid in subcutaneous fat of the distal lower leg posterior laterally, suspicious for residual abscess status post recent debridement -Will place patient on IV vancomycin and Zosyn -Will consult wound care and orthopedic surgery  Hypothyroidism -Continue Synthroid  History of myelodysplastic syndrome -Dr. Beryle Beams aware of patient's admission  History of prostate cancer -Stable  Lower extremity edema -Will hold Lasix at this time  GERD -Continue H2 blocker  DVT prophylaxis: Lovenox  Code Status: Full  Condition: Guarded  Family Communication: None at bedside. Admission, patients condition and plan of care including tests being ordered have been discussed with the patient, who indicates  understanding and agrees with the plan and Code Status.  Disposition Plan: Admitted  Time spent: 65 minutes  Demani Mcbrien D.O. [facility] Pager [phone]  If 7PM-7AM, please contact night-coverage [email or web address] Password [facility] 05/12/2015, 12:44 PM

## 2015-05-12 NOTE — Progress Notes (Signed)
Patient ID: Joel Macdonald, male   DOB: April 13, 1930, 82 y.o.   MRN: [id number] I spoke with Dr. Tamala Julian in detail and examined his complex right ankle/leg wound.  He does need an I&D in the OR.  I will set this up for tomorrow am (2/4).  Have also discussed this case with my partner, Dr. Sharol Given, who specializes in complex lower extremity wounds.  He will likely perform the surgery tomorrow.

## 2015-05-12 NOTE — Consult Note (Signed)
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 In addition, he has prostate cancer in remission. He initially presented with pancytopenia in February 2013. No excess blasts on that bone marrow and he was followed with observation alone until he developed progressive, transfusion dependent, anemia and monocytosis. Bone marrow aspiration and biopsy repeated on 05/05/2014 now showed excess blasts of 7%. Normal cytogenetics. In view of his age and comorbid conditions, I elected not to put him on a chemotherapy program. He was started on Aranesp subcutaneous every 3 weeks to support his red cells and this has been successful in keeping his hemoglobin in the 9-10 gram range and sparing him from blood transfusions. He has developed a number of other problems summarized in my office note of 02/20/2015. He developed refractory unexplained bilateral peripheral edema with a negative cardiac and renal evaluation. No evidence for blood clots. Secondary to the persistent edema, he developed recurrent episodes of bilateral cellulitis requiring multiple courses of antibiotics. At time of a 11/18/2014 visit, he had developed a superficial abscess on the skin of his left leg which was incised and drained in the office. He now presents with a nonhealing ulcer with purulent discharge anterolateral aspect of his right ankle which began to develop just 2 days before Christmas. Has had a number of courses of oral antibiotics with doxycycline and Keflex. He was referred back to the wound center. He had another incision and drainage procedure and packing was left in the wound. Since there has been no significant improvement, a CT scan of the right ankle was done on  February 1 which does show a superficial collection of gas and fluid in the subcutaneous fat suspicious for residual abscess but no involvement of the underlying muscle or bone.  He denies any fevers or chills. Routine blood counts done through my office on 05/09/2015 showed a normal total white count 5300 with 22% neutrophils, ANC 1,1oo, 32% lymphocytes, 43% monocytes, hemoglobin 9.5, MCV 102, and platelet count 154,000. No blasts were reported on the peripheral blood film. Monocyte count similar to previous done on 02/10/2015.  He just had a small lesion removed from the medial aspect of his left By his dermatologist last week and was told it was a squamous cell carcinoma.   Past Medical History  Diagnosis Date  . MGUS (monoclonal gammopathy of unknown significance) 06/17/2011  . Cellulitis of leg, right 07/12/2013  . Ischemia of extremity 07/12/2013    Bilateral toes  2,3,4 right; 1,3 left  Vascular doppler normal for large vessel occlusion 2/15  . Heart murmur 1936  . Pancytopenia, acquired ([place]) 06/17/2011    "from my Myelodysplastic syndrome"  . GERD (gastroesophageal reflux disease)   . Osteoarthritis     "back, legs, arms, shoulders" (07/12/2013)  . MDS (myelodysplastic syndrome), low grade ([place]) 09/20/2011    Bone marrow bx 06/13/11: mild dyserythro/dymegakaryopoiesis.  Normal cytogenetics.  FISH negative for chrom 5 or 7 deletions.  Scattered small infiltrates of plasmacytoid lymphs  . Prostate cancer ([place])   . Edema, peripheral 03/21/2014  . Monocytosis 03/21/2014  . MDS (myelodysplastic syndrome), high grade ([place]) 06/13/2014    7% blasts BM bx 05/05/14  . Senile purpura ([place]) 10/18/2014  . Abscess of skin 11/17/2014    Left ankle: incise &  drain 11/17/14  :  Past Surgical History  Procedure Laterality Date  . Tonsillectomy and adenoidectomy  ~ 1939  . Robot assisted laparoscopic radical prostatectomy  ~ 2010  . Cataract extraction w/ intraocular lens  implant, bilateral Bilateral ~ 2012   :  :  Allergies  Allergen Reactions  . Nsaids Other (See Comments)  . Azithromycin Nausea And Vomiting  . Other Other (See Comments)    Ragweed causes nasal congestion  :  Family History  Problem Relation Age of Onset  . Other Mother   . Other Father   . Heart disease Father   . Hyperlipidemia Son   :  Social History  One of his sons is a Copywriter, advertising in [place]. Social History  . Marital Status: Married    Spouse Name: N/A  . Number of Children:   . Years of Education: N/A   Occupational History  .  retired Lexicographer and educator    Social History Main Topics  . Smoking status: Former Smoker -- 40 years    Types: Pipe    Quit date: 07/12/1993  . Smokeless tobacco: Never Used  . Alcohol Use: 0.0 oz/week    0 Standard drinks or equivalent per week     Comment: occasionally.  . Drug Use: No  . Sexual Activity: No   Other Topics  Wife has cardiac disease and is going to have a heart valve replacement this month   Social History Narrative  : One of his sons is a Copywriter, advertising in [place]: Eyes: No headache or change in vision Throat: No sore throat Neck: No stiff neck Resp:  No dyspnea Cardio: No ischemic quality chest pain or palpitations GI: No abdominal pain or change in bowel habit Extremities: Chronic swelling in both lower extremities right greater than left Lymph nodes: No swollen glands  Neurologic: No headache or change in vision  Skin: . Chronic purpura of his arms. Chronic vasculitic changes of his toes and intermittently, his fingers Genitourinary: Not questioned   Vitals: Filed Vitals:   05/12/15 1217  BP: 138/63  Pulse: 111  Temp: 98.1 F (36.7 C)  Resp: 14    PHYSICAL EXAM: General appearance: Well-nourished Caucasian man HEENT: Pharynx no erythema or exudate or ulcer Lymph Nodes: No cervical, supraclavicular, axillary, or inguinal adenopathy Resp: Lungs clear to auscultation and  resonant to percussion throughout Cardio: Regular cardiac rhythm, S1, S4, S2, no murmur Vascular: Carotids 2+ no bruits. Dorsalis pedis pulses 2+ symmetric Breasts: GI: Abdomen soft nontender no mass no organomegaly GU: Extremities: Chronic edema and venous stasis changes with asymmetric calf tenderness right greater than left Neurologic: Alert and oriented 3, motor strength 5 over 5, reflexes 1+ symmetric. Skin: Multiple small purpura areas of the skin of his forearms which are chronic There is a large, complex, ulcerated, lesion anterolateral aspect mid ankle. Irregular nodular borders. Packing inserted. Unable to elicit fluctuance or expressible pus. Vasculitic changes second digit right foot, first third and fourth digits left foot with cyanosis on the plantar surface of his toes. Currently no vasculitic changes of his fingertips. Small 1 cm in diameter superficial nonhealing ulcer where a squamous cell carcinoma was excised from the medial aspect of his left calf.  Labs: See above: Total White count normal on January 31, 22% neutrophils, 43% monocytes, absolute neutrophils 1100. Chronic monocytosis unchanged. No blasts noted in the peripheral blood.  No results for input(s): WBC, HGB, HCT, PLT in the last  72 hours. No results for input(s): NA, K, CL, CO2, GLUCOSE, BUN, CREATININE, CALCIUM in the last 72 hours.    Images Studies/Results:  Ct Tibia Fibula Right W Contrast  05/10/2015  CLINICAL DATA:  Evaluate for recurrent right lower leg abscess. History of bilateral leg abscesses with debridement 2 weeks ago. History of myelodysplastic syndrome and prostate cancer. EXAM: CT OF THE LOWER RIGHT EXTREMITY WITH CONTRAST TECHNIQUE: Multidetector CT imaging of the right lower leg was performed according to the standard protocol following intravenous contrast administration. COMPARISON:  None. CONTRAST:  176mL ISOVUE-300 IOPAMIDOL (ISOVUE-300) INJECTION 61% FINDINGS: There is an area of skin  irregularity posterolaterally in the distal third of the right lower leg, presumably at the site of recent surgical debridement. Within the underlying subcutaneous fat, there is a complex ill-defined collection of fluid and gas. This measures up to 3.4 x 1.5 cm on axial image 135 and extends approximately 3.9 cm cephalocaudad. No foreign bodies are apparent in this area. The inflammatory changes appear confined to the subcutaneous fat. The underlying muscles demonstrate normal enhancement and no focal fluid collection. There is generalized subcutaneous edema throughout the distal lower leg. No other focal fluid collections are seen. There are some muscular venous collateral vessels. Mild popliteal arterial atherosclerosis noted. There is no evidence of acute fracture, dislocation or bone destruction. Relatively mild degenerative changes are present at the knee and ankle. No significant joint effusions seen. IMPRESSION: 1. Superficial collection of gas and fluid in the subcutaneous fat of the distal lower leg posterolaterally, suspicious for residual abscess status post recent debridement. No involvement of the underlying musculature identified. 2. No evidence of osteomyelitis or acute osseous findings. 3. These results will be called to the ordering clinician or representative by the Radiologist Assistant, and communication documented in the PACS or zVision Dashboard. Electronically Signed   By: Richardean Sale M.D.   On: 05/10/2015 16:20        Assessment: Active Problems:   MDS (myelodysplastic syndrome), high grade (HCC)   Cellulitis and abscess of leg   Peripheral vascular disease, unspecified (HCC)   Abscess  Impression: Complex, nonhealing, nodular, ulcerated, lesion on the anterolateral aspect of his ankle. The lesion looks like it may be malignant and not just a simple abscess. Part of the reason that these lesions don't heal is the white blood cell functional deficit associated with his  MDS.  Recommendation: I certainly agree that he needs parenteral antibiotics with broad coverage of both gram-positive, gram-negative, and anaerobic organisms. Orthopedic consultation for deeper incision, drainage, debridement, and submit tissue for pathology to exclude cutaneous malignancy.    Murriel Hopper, MD, FACP  Hematology-Oncology/Internal Medicine  05/12/2015, 1:31 PM I was a

## 2015-05-12 NOTE — Progress Notes (Signed)
NURSING PROGRESS NOTE  LEONID BAISE [id number] Admission Data: 05/12/2015 12:12 PM Attending Provider: Cristal Ford, DO WE:[id number] Marcello Moores, MD Code Status:  Full Code  CONSUELO BODKINS is a 82 y.o. male patient admitted from ED:  -No acute distress noted.  -No complaints of shortness of breath.  -No complaints of chest pain.   There were no vitals taken for this visit.   IV Fluids:  No IV at this time.   Allergies:  Nsaids; Azithromycin; and Other  Past Medical History:   has a past medical history of MGUS (monoclonal gammopathy of unknown significance) (06/17/2011); Cellulitis of leg, right (07/12/2013); Ischemia of extremity (07/12/2013); Heart murmur (1936); Pancytopenia, acquired ([place]) (06/17/2011); GERD (gastroesophageal reflux disease); Osteoarthritis; MDS (myelodysplastic syndrome), low grade ([place]) (09/20/2011); Prostate cancer ([place]); Edema, peripheral (03/21/2014); Monocytosis (03/21/2014); MDS (myelodysplastic syndrome), high grade ([place]) (06/13/2014); Senile purpura ([place]) (10/18/2014); and Abscess of skin (11/17/2014).  Past Surgical History:   has past surgical history that includes Tonsillectomy and adenoidectomy (~ 1939); Robot assisted laparoscopic radical prostatectomy (~ 2010); and Cataract extraction w/ intraocular lens  implant, bilateral (Bilateral, ~ 2012).  Social History:   reports that he quit smoking about 21 years ago. His smoking use included Pipe. He has never used smokeless tobacco. He reports that he drinks alcohol. He reports that he does not use illicit drugs.  Skin: Abcess right lower extremity.  Patient/Family orientated to room. Information packet given to patient/family. Admission inpatient armband information verified with patient/family to include name and date of birth and placed on patient arm. Side rails up x 2, fall assessment and education completed with patient/family. Patient/family able to verbalize understanding of risk associated with falls and  verbalized understanding to call for assistance before getting out of bed. Call light within reach. Patient/family able to voice and demonstrate understanding of unit orientation instructions.    Will continue to evaluate and treat per MD orders.

## 2015-05-13 ENCOUNTER — Encounter (HOSPITAL_COMMUNITY): Payer: Self-pay | Admitting: Certified Registered"

## 2015-05-13 ENCOUNTER — Encounter (HOSPITAL_COMMUNITY)
Admission: AD | Disposition: A | Discharge status: Home / Self Care | Payer: Self-pay | Source: Ambulatory Visit | Attending: Internal Medicine

## 2015-05-13 ENCOUNTER — Inpatient Hospital Stay (HOSPITAL_COMMUNITY): Payer: Medicare Other | Admitting: Certified Registered"

## 2015-05-13 DIAGNOSIS — L03116 Cellulitis of left lower limb: Secondary | ICD-10-CM

## 2015-05-13 HISTORY — PX: I&D EXTREMITY: SHX5045

## 2015-05-13 LAB — BASIC METABOLIC PANEL
Anion gap: 10 (ref 5–15)
BUN: 10 mg/dL (ref 6–20)
CALCIUM: 8.8 mg/dL — AB (ref 8.9–10.3)
CO2: 23 mmol/L (ref 22–32)
CREATININE: 1.06 mg/dL (ref 0.61–1.24)
Chloride: 104 mmol/L (ref 101–111)
GFR calc Af Amer: >60 mL/min (ref >60)
GFR calc non Af Amer: >60 mL/min (ref >60)
GLUCOSE: 97 mg/dL (ref 65–99)
Potassium: 3.6 mmol/L (ref 3.5–5.1)
Sodium: 137 mmol/L (ref 135–145)

## 2015-05-13 LAB — CBC
HCT: 28.5 % — ABNORMAL LOW (ref 39.0–52.0)
HEMOGLOBIN: 9.3 g/dL — AB (ref 13.0–17.0)
MCH: 34.3 pg — AB (ref 26.0–34.0)
MCHC: 32.6 g/dL (ref 30.0–36.0)
MCV: 105.2 fL — ABNORMAL HIGH (ref 78.0–100.0)
PLATELETS: 127 10*3/uL — AB (ref 150–400)
RBC: 2.71 MIL/uL — ABNORMAL LOW (ref 4.22–5.81)
RDW: 16.5 % — ABNORMAL HIGH (ref 11.5–15.5)
WBC: 6.6 10*3/uL (ref 4.0–10.5)

## 2015-05-13 LAB — SURGICAL PCR SCREEN
MRSA, PCR: NEGATIVE
Staphylococcus aureus: NEGATIVE

## 2015-05-13 SURGERY — IRRIGATION AND DEBRIDEMENT EXTREMITY
Anesthesia: General | Site: Leg Lower | Laterality: Right

## 2015-05-13 MED ORDER — SUCCINYLCHOLINE CHLORIDE 20 MG/ML IJ SOLN
INTRAMUSCULAR | Status: AC
  Filled 2015-05-13: qty 1

## 2015-05-13 MED ORDER — OXYCODONE HCL 5 MG PO TABS
5.0000 mg | ORAL_TABLET | ORAL | Status: DC | PRN
Start: 2015-05-13 — End: 2015-05-19

## 2015-05-13 MED ORDER — OXYCODONE HCL 5 MG/5ML PO SOLN: 5.0000 mg | Freq: Once | ORAL | Status: DC | PRN

## 2015-05-13 MED ORDER — ONDANSETRON HCL 4 MG/2ML IJ SOLN
INTRAMUSCULAR | Status: DC | PRN
  Administered 2015-05-13: 4 mg via INTRAVENOUS

## 2015-05-13 MED ORDER — ACETAMINOPHEN 325 MG PO TABS
650.0000 mg | ORAL_TABLET | Freq: Four times a day (QID) | ORAL | Status: DC | PRN
  Administered 2015-05-13 – 2015-05-17 (×7): 650 mg via ORAL
  Filled 2015-05-13 (×6): qty 2

## 2015-05-13 MED ORDER — LIDOCAINE HCL (CARDIAC) 20 MG/ML IV SOLN
INTRAVENOUS | Status: DC | PRN
  Administered 2015-05-13: 80 mg via INTRAVENOUS

## 2015-05-13 MED ORDER — ONDANSETRON HCL 4 MG/2ML IJ SOLN: 4.0000 mg | Freq: Four times a day (QID) | INTRAMUSCULAR | Status: DC | PRN

## 2015-05-13 MED ORDER — SODIUM CHLORIDE 0.9 % IV SOLN
INTRAVENOUS | Status: DC | PRN
  Administered 2015-05-13: 1000 mL via INTRAMUSCULAR

## 2015-05-13 MED ORDER — 0.9 % SODIUM CHLORIDE (POUR BTL) OPTIME
TOPICAL | Status: DC | PRN
  Administered 2015-05-13: 1000 mL

## 2015-05-13 MED ORDER — LIDOCAINE HCL (CARDIAC) 20 MG/ML IV SOLN
INTRAVENOUS | Status: AC
  Filled 2015-05-13: qty 5

## 2015-05-13 MED ORDER — PROPOFOL 10 MG/ML IV BOLUS
INTRAVENOUS | Status: DC | PRN
  Administered 2015-05-13: 170 mg via INTRAVENOUS

## 2015-05-13 MED ORDER — FENTANYL CITRATE (PF) 250 MCG/5ML IJ SOLN
INTRAMUSCULAR | Status: AC
  Filled 2015-05-13: qty 5

## 2015-05-13 MED ORDER — VANCOMYCIN HCL 500 MG IV SOLR
INTRAVENOUS | Status: DC | PRN
  Administered 2015-05-13: 500 mg

## 2015-05-13 MED ORDER — FENTANYL CITRATE (PF) 250 MCG/5ML IJ SOLN
INTRAMUSCULAR | Status: DC | PRN
  Administered 2015-05-13: 25 ug via INTRAVENOUS
  Administered 2015-05-13: 50 ug via INTRAVENOUS
  Administered 2015-05-13 (×5): 25 ug via INTRAVENOUS
  Administered 2015-05-13: 50 ug via INTRAVENOUS

## 2015-05-13 MED ORDER — PROPOFOL 10 MG/ML IV BOLUS
INTRAVENOUS | Status: AC
  Filled 2015-05-13: qty 20

## 2015-05-13 MED ORDER — HYDROMORPHONE HCL 1 MG/ML IJ SOLN
1.0000 mg | INTRAMUSCULAR | Status: DC | PRN
  Administered 2015-05-17: 1 mg via INTRAVENOUS

## 2015-05-13 MED ORDER — SODIUM CHLORIDE 0.9 % IV SOLN
INTRAVENOUS | Status: DC
  Administered 2015-05-13: 12:00:00 via INTRAVENOUS

## 2015-05-13 MED ORDER — ONDANSETRON HCL 4 MG PO TABS: 4.0000 mg | ORAL_TABLET | Freq: Four times a day (QID) | ORAL | Status: DC | PRN

## 2015-05-13 MED ORDER — METHOCARBAMOL 500 MG PO TABS: 500.0000 mg | ORAL_TABLET | Freq: Four times a day (QID) | ORAL | Status: DC | PRN

## 2015-05-13 MED ORDER — ONDANSETRON HCL 4 MG/2ML IJ SOLN
INTRAMUSCULAR | Status: AC
  Filled 2015-05-13: qty 2

## 2015-05-13 MED ORDER — METHOCARBAMOL 1000 MG/10ML IJ SOLN
500.0000 mg | Freq: Four times a day (QID) | INTRAVENOUS | Status: DC | PRN
  Filled 2015-05-13: qty 5

## 2015-05-13 MED ORDER — SODIUM CHLORIDE 0.9 % IR SOLN
Status: DC | PRN
  Administered 2015-05-13: 3000 mL

## 2015-05-13 MED ORDER — LACTATED RINGERS IV SOLN
INTRAVENOUS | Status: DC | PRN
  Administered 2015-05-13: 07:00:00 via INTRAVENOUS

## 2015-05-13 MED ORDER — OXYCODONE HCL 5 MG PO TABS: 5.0000 mg | ORAL_TABLET | Freq: Once | ORAL | Status: DC | PRN

## 2015-05-13 MED ORDER — HYDROMORPHONE HCL 1 MG/ML IJ SOLN
0.2500 mg | INTRAMUSCULAR | Status: DC | PRN
  Administered 2015-05-13 (×2): 0.25 mg via INTRAVENOUS
  Administered 2015-05-13: 0.5 mg via INTRAVENOUS

## 2015-05-13 MED ORDER — METOCLOPRAMIDE HCL 5 MG/ML IJ SOLN: 5.0000 mg | Freq: Three times a day (TID) | INTRAMUSCULAR | Status: DC | PRN

## 2015-05-13 MED ORDER — METOCLOPRAMIDE HCL 10 MG PO TABS: 5.0000 mg | ORAL_TABLET | Freq: Three times a day (TID) | ORAL | Status: DC | PRN

## 2015-05-13 MED ORDER — HYDROMORPHONE HCL 1 MG/ML IJ SOLN
INTRAMUSCULAR | Status: AC
  Administered 2015-05-13: 0.25 mg via INTRAVENOUS
  Filled 2015-05-13: qty 1

## 2015-05-13 MED ORDER — ACETAMINOPHEN 650 MG RE SUPP: 650.0000 mg | Freq: Four times a day (QID) | RECTAL | Status: DC | PRN

## 2015-05-13 MED ORDER — GENTAMICIN SULFATE 40 MG/ML IJ SOLN
INTRAMUSCULAR | Status: DC | PRN
  Administered 2015-05-13 (×2): 80 mg via INTRAMUSCULAR

## 2015-05-13 SURGICAL SUPPLY — 42 items
BLADE SURG 10 STRL SS (BLADE) IMPLANT
BLADE SURG 21 STRL SS (BLADE) ×3 IMPLANT
BNDG COHESIVE 4X5 TAN STRL (GAUZE/BANDAGES/DRESSINGS) IMPLANT
BNDG COHESIVE 6X5 TAN STRL LF (GAUZE/BANDAGES/DRESSINGS) IMPLANT
BNDG GAUZE ELAST 4 BULKY (GAUZE/BANDAGES/DRESSINGS) ×6 IMPLANT
CANISTER WOUND CARE 500ML ATS (WOUND CARE) ×6 IMPLANT
COVER SURGICAL LIGHT HANDLE (MISCELLANEOUS) ×6 IMPLANT
DRAPE STERI IOBAN 125X83 (DRAPES) ×4 IMPLANT
DRAPE U-SHAPE 47X51 STRL (DRAPES) ×3 IMPLANT
DRSG ADAPTIC 3X8 NADH LF (GAUZE/BANDAGES/DRESSINGS) ×3 IMPLANT
DRSG VAC ATS MED SENSATRAC (GAUZE/BANDAGES/DRESSINGS) ×2 IMPLANT
DURAPREP 26ML APPLICATOR (WOUND CARE) ×3 IMPLANT
ELECT CAUTERY BLADE 6.4 (BLADE) IMPLANT
ELECT REM PT RETURN 9FT ADLT (ELECTROSURGICAL)
ELECTRODE REM PT RTRN 9FT ADLT (ELECTROSURGICAL) IMPLANT
GAUZE SPONGE 4X4 12PLY STRL (GAUZE/BANDAGES/DRESSINGS) ×3 IMPLANT
GLOVE BIOGEL PI IND STRL 9 (GLOVE) ×1 IMPLANT
GLOVE BIOGEL PI INDICATOR 9 (GLOVE) ×2
GLOVE SURG ORTHO 9.0 STRL STRW (GLOVE) ×3 IMPLANT
GOWN STRL REUS W/ TWL LRG LVL3 (GOWN DISPOSABLE) ×1 IMPLANT
GOWN STRL REUS W/ TWL XL LVL3 (GOWN DISPOSABLE) ×2 IMPLANT
GOWN STRL REUS W/TWL LRG LVL3 (GOWN DISPOSABLE) ×3
GOWN STRL REUS W/TWL XL LVL3 (GOWN DISPOSABLE) ×6
HANDPIECE INTERPULSE COAX TIP (DISPOSABLE) ×3
KIT BASIN OR (CUSTOM PROCEDURE TRAY) ×3 IMPLANT
KIT ROOM TURNOVER OR (KITS) ×3 IMPLANT
KIT STIMULAN RAPID CURE 5CC (Orthopedic Implant) ×2 IMPLANT
MANIFOLD NEPTUNE II (INSTRUMENTS) ×3 IMPLANT
NS IRRIG 1000ML POUR BTL (IV SOLUTION) ×3 IMPLANT
PACK ORTHO EXTREMITY (CUSTOM PROCEDURE TRAY) ×3 IMPLANT
PAD ARMBOARD 7.5X6 YLW CONV (MISCELLANEOUS) ×6 IMPLANT
SET HNDPC FAN SPRY TIP SCT (DISPOSABLE) IMPLANT
STOCKINETTE IMPERVIOUS 9X36 MD (GAUZE/BANDAGES/DRESSINGS) IMPLANT
SWAB COLLECTION DEVICE MRSA (MISCELLANEOUS) ×3 IMPLANT
TOWEL OR 17X24 6PK STRL BLUE (TOWEL DISPOSABLE) ×3 IMPLANT
TOWEL OR 17X26 10 PK STRL BLUE (TOWEL DISPOSABLE) ×3 IMPLANT
TUBE ANAEROBIC SPECIMEN COL (MISCELLANEOUS) IMPLANT
TUBE CONNECTING 12'X1/4 (SUCTIONS) ×1
TUBE CONNECTING 12X1/4 (SUCTIONS) ×2 IMPLANT
UNDERPAD 30X30 INCONTINENT (UNDERPADS AND DIAPERS) ×3 IMPLANT
WATER STERILE IRR 1000ML POUR (IV SOLUTION) ×3 IMPLANT
YANKAUER SUCT BULB TIP NO VENT (SUCTIONS) ×3 IMPLANT

## 2015-05-13 NOTE — Transfer of Care (Signed)
Immediate Anesthesia Transfer of Care Note  Patient: Joel Macdonald  Procedure(s) Performed: Procedure(s): IRRIGATION AND DEBRIDEMENT ANKLE (Right)  Patient Location: PACU  Anesthesia Type:General  Level of Consciousness: awake, alert , oriented and patient cooperative  Airway & Oxygen Therapy: Patient Spontanous Breathing and Patient connected to nasal cannula oxygen  Post-op Assessment: Report given to RN, Post -op Vital signs reviewed and stable and Patient moving all extremities  Post vital signs: Reviewed and stable  Last Vitals:  Filed Vitals:   05/12/15 2109 05/13/15 0618  BP: 163/78 146/80  Pulse: 97 82  Temp: 37.1 C 36.9 C  Resp: 18 18    Complications: No apparent anesthesia complications

## 2015-05-13 NOTE — Anesthesia Postprocedure Evaluation (Signed)
Anesthesia Post Note  Patient: Joel Macdonald  Procedure(s) Performed: Procedure(s) (LRB): IRRIGATION AND DEBRIDEMENT ANKLE (Right)  Patient location during evaluation: PACU Anesthesia Type: General Level of consciousness: awake and alert and patient cooperative Pain management: pain level controlled Vital Signs Assessment: post-procedure vital signs reviewed and stable Respiratory status: spontaneous breathing and respiratory function stable Cardiovascular status: stable Anesthetic complications: no    Last Vitals:  Filed Vitals:   05/13/15 1302 05/13/15 1323  BP: 148/76 167/88  Pulse: 68 77  Temp:  36.5 C  Resp:      Last Pain:  Filed Vitals:   05/13/15 1421  PainSc: 2                  Ramzi Brathwaite S

## 2015-05-13 NOTE — Op Note (Signed)
05/12/2015 - 05/13/2015  8:55 AM  PATIENT:  Joel Macdonald    PRE-OPERATIVE DIAGNOSIS:  ankle wound abscess  POST-OPERATIVE DIAGNOSIS:  Same  PROCEDURE:  Excisional debridement right leg with necrotic wound 13 x 7 cm x 1 cm deep. Application of antibiotic beads. Application of vera flow wound VAC.  Cultures obtained 2  SURGEON:  Newt Minion, MD  PHYSICIAN ASSISTANT:None ANESTHESIA:   General  PREOPERATIVE INDICATIONS:  RANEY SCHUBBE is a  82 y.o. male with a diagnosis of ankle wound abscess who failed conservative measures and elected for surgical management.    The risks benefits and alternatives were discussed with the patient preoperatively including but not limited to the risks of infection, bleeding, nerve injury, cardiopulmonary complications, the need for revision surgery, among others, and the patient was willing to proceed.  OPERATIVE IMPLANTS: Vera flow wound VAC, antibiotic beads with 500 mg vancomycin and 160 mg gentamicin 5 mL stimulant  OPERATIVE FINDINGS: Large deep abscess 13 x 7 x 1 cm of skin soft tissue fascia and muscle excised with a 21 blade knife and Ronjair   OPERATIVE PROCEDURE: Patient is a 82 year old gentleman with chronic venous stasis insufficiency bilateral lower extremities. He has undergone prolonged conservative wound care with packing the wounds at the wound center to the right leg. Patient presents at this time with large deep purulent abscess with necrotic tissue and presents for surgical intervention.  Patient was brought to the operating room and underwent a general anesthetic. After adequate levels anesthesia obtained patient's right lower extremity was prepped using DuraPrep draped into a sterile field. A timeout was called. Elliptical incision was made around the necrotic tissue this left a wound which was 13 x 7 cm and 1 cm deep. The skin soft tissue muscle and fascia was excised 21 blade knife and a Ronjair. The wound was irrigated with  pulsatile lavage. The stimulant antibiotic beads were placed with 5 mL stimulant beads with 500 mg vancomycin and 160 mg gentamicin. A vera flow wound VAC was applied set to 12 mL of fluid 10 mL of 12 time 2 hours of suction at 75 mm of suction. Patient was extubated taken to the PACU in stable condition plan for return to the operating room for application of skin graft.

## 2015-05-13 NOTE — Progress Notes (Signed)
[facility] PROGRESS NOTE    Progress Note   Joel Macdonald [id number] DOB: July 11, 1930 DOA: 05/12/2015 PCP: Mathews Argyle, MD   Brief Narrative:   Joel Macdonald is an 82 y.o. male with history of prostate cancer and myelodysplastic syndrome comes in from his PCPs office for nonhealing and worsening abscess.  Assessment/Plan:  Right Cellulitis and abscess of leg/cellulitis: Started empirically on vancomycin and Zosyn orthopedic surgery was consulted who performed an I and D of the right leg and place wound VAC.  Hypothyroidism: Continue Synthroid.  History of myelodysplastic syndrome: Follow-up with [place] hematology as an outpatient.      DVT Prophylaxis - Lovenox ordered.  Family Communication: none Disposition Plan: Home 3-4 days Code Status:     Code Status Orders        Start     Ordered   05/12/15 1339  Full code   Continuous     05/12/15 1338    Code Status History    Date Active Date Inactive Code Status Order ID Comments User Context   07/12/2013  6:02 PM 07/14/2013  5:08 PM Full Code [id number]  Charlynne Cousins, MD Inpatient    Advance Directive Documentation        Most Recent Value   Type of Advance Directive  Healthcare Power of Attorney, Living will   Pre-existing out of facility DNR order (yellow form or pink MOST form)     "MOST" Form in Place?          IV Access:    Peripheral IV   Procedures and diagnostic studies:   No results found.   Medical Consultants:    None.  Anti-Infectives:   Anti-infectives    Start     Dose/Rate Route Frequency Ordered Stop   05/13/15 0748  vancomycin (VANCOCIN) powder  Status:  Discontinued       As needed 05/13/15 0804 05/13/15 0849   05/13/15 0745  gentamicin (GARAMYCIN) injection  Status:  Discontinued       As needed 05/13/15 0803 05/13/15 0849   05/13/15 0500  vancomycin (VANCOCIN) IVPB 750 mg/150 ml premix     750 mg 150 mL/hr over 60 Minutes Intravenous Every 12 hours  05/12/15 1410     05/12/15 2200  piperacillin-tazobactam (ZOSYN) IVPB 3.375 g     3.375 g 12.5 mL/hr over 240 Minutes Intravenous 3 times per day 05/12/15 1353     05/12/15 1400  vancomycin (VANCOCIN) IVPB 1000 mg/200 mL premix     1,000 mg 200 mL/hr over 60 Minutes Intravenous  Once 05/12/15 1338 05/12/15 1657   05/12/15 1400  piperacillin-tazobactam (ZOSYN) IVPB 3.375 g     3.375 g 100 mL/hr over 30 Minutes Intravenous  Once 05/12/15 1352 05/12/15 1510      Subjective:    Joel Macdonald no complains  Objective:    Filed Vitals:   05/13/15 0900 05/13/15 0915 05/13/15 0930 05/13/15 0945  BP: 153/76 133/81 149/74   Pulse: 69 60 62 76  Temp:    97.9 F (36.6 C)  TempSrc:      Resp: 10 13 11 17   Height:      Weight:      SpO2: 100% 100% 100% 100%    Intake/Output Summary (Last 24 hours) at 05/13/15 1037 Last data filed at 05/13/15 0831  Gross per 24 hour  Intake    800 ml  Output     25 ml  Net    775 ml  Filed Weights   05/12/15 1217  Weight: 88.6 kg (195 lb 5.2 oz)    Exam: Gen:  NAD Cardiovascular:  RRR, No M/R/G Chest and lungs:   CTAB Abdomen:  Abdomen soft, NT/ND, + BS Extremities:  Wound vac in place.   Data Reviewed:    Labs: Basic Metabolic Panel:  Recent Labs Lab 05/09/15 1027 05/12/15 1357 05/13/15 0630  NA 139 140 137  K 4.2 3.8 3.6  CL 101 106 104  CO2 23 25 23   GLUCOSE 78 107* 97  BUN 24 18 10   CREATININE 0.84 0.99 1.06  CALCIUM 8.6 8.6* 8.8*   GFR Estimated Creatinine Clearance: 59 mL/min (by C-G formula based on Cr of 1.06). Liver Function Tests:  Recent Labs Lab 05/09/15 1027 05/12/15 1357  AST 14 16  ALT 8 11*  ALKPHOS 75 68  BILITOT 0.4 0.5  PROT 6.7 6.8  ALBUMIN 4.0 3.6   No results for input(s): LIPASE, AMYLASE in the last 168 hours. No results for input(s): AMMONIA in the last 168 hours. Coagulation profile No results for input(s): INR, PROTIME in the last 168 hours.  CBC:  Recent Labs Lab  05/08/15 1210 05/09/15 1027 05/12/15 1357 05/13/15 0630  WBC  --  5.3 5.8 6.6  NEUTROABS  --  1.1* 2.0  --   HGB 9.7*  --  8.9* 9.3*  HCT  --  28.9* 27.6* 28.5*  MCV  --  102* 104.2* 105.2*  PLT  --  154 134* 127*   Cardiac Enzymes: No results for input(s): CKTOTAL, CKMB, CKMBINDEX, TROPONINI in the last 168 hours. BNP (last 3 results) No results for input(s): PROBNP in the last 8760 hours. CBG: No results for input(s): GLUCAP in the last 168 hours. D-Dimer: No results for input(s): DDIMER in the last 72 hours. Hgb A1c: No results for input(s): HGBA1C in the last 72 hours. Lipid Profile: No results for input(s): CHOL, HDL, LDLCALC, TRIG, CHOLHDL, LDLDIRECT in the last 72 hours. Thyroid function studies: No results for input(s): TSH, T4TOTAL, T3FREE, THYROIDAB in the last 72 hours.  Invalid input(s): FREET3 Anemia work up: No results for input(s): VITAMINB12, FOLATE, FERRITIN, TIBC, IRON, RETICCTPCT in the last 72 hours. Sepsis Labs:  Recent Labs Lab 05/09/15 1027 05/12/15 1357 05/13/15 0630  WBC 5.3 5.8 6.6   Microbiology Recent Results (from the past 240 hour(s))  Surgical pcr screen     Status: None   Collection Time: 05/12/15 11:09 PM  Result Value Ref Range Status   MRSA, PCR NEGATIVE NEGATIVE Final   Staphylococcus aureus NEGATIVE NEGATIVE Final    Comment:        The Xpert SA Assay (FDA approved for NASAL specimens in patients over 27 years of age), is one component of a comprehensive surveillance program.  Test performance has been validated by [facility] for patients greater than or equal to 33 year old. It is not intended to diagnose infection nor to guide or monitor treatment.      Medications:   . clobetasol ointment  1 application Topical BID  . enoxaparin (LOVENOX) injection  40 mg Subcutaneous Q24H  . levothyroxine  50 mcg Oral QAC breakfast  . piperacillin-tazobactam (ZOSYN)  IV  3.375 g Intravenous 3 times per day  . sodium chloride  flush  3 mL Intravenous Q12H  . vancomycin  750 mg Intravenous Q12H   Continuous Infusions: . sodium chloride      Time spent:25 min   LOS: 1 day   Joel Macdonald  Marguarite Arbour  [facility] Pager [phone]  *Please refer to [email or web address], password [email or web address] to get updated schedule on who will round on this patient, as hospitalists switch teams weekly. If 7PM-7AM, please contact night-coverage at [email or web address], password [email or web address] for any overnight needs.  05/13/2015, 10:37 AM

## 2015-05-13 NOTE — Anesthesia Preprocedure Evaluation (Signed)
Anesthesia Evaluation  Patient identified by MRN, date of birth, ID band Patient awake    Reviewed: Allergy & Precautions, NPO status , Patient's Chart, lab work & pertinent test results  Airway Mallampati: II   Neck ROM: full    Dental   Pulmonary former smoker,    breath sounds clear to auscultation       Cardiovascular hypertension, + Peripheral Vascular Disease   Rhythm:regular Rate:Normal     Neuro/Psych  Neuromuscular disease    GI/Hepatic GERD  ,  Endo/Other    Renal/GU      Musculoskeletal  (+) Arthritis ,   Abdominal   Peds  Hematology   Anesthesia Other Findings   Reproductive/Obstetrics                             Anesthesia Physical Anesthesia Plan  ASA: II  Anesthesia Plan: General   Post-op Pain Management:    Induction: Intravenous  Airway Management Planned: LMA  Additional Equipment:   Intra-op Plan:   Post-operative Plan:   Informed Consent: I have reviewed the patients History and Physical, chart, labs and discussed the procedure including the risks, benefits and alternatives for the proposed anesthesia with the patient or authorized representative who has indicated his/her understanding and acceptance.     Plan Discussed with: CRNA, Anesthesiologist and Surgeon  Anesthesia Plan Comments:         Anesthesia Quick Evaluation

## 2015-05-13 NOTE — Progress Notes (Signed)
UR COMPLETED  

## 2015-05-13 NOTE — Evaluation (Signed)
Physical Therapy Evaluation Patient Details Name: Joel Macdonald MRN: [id number] DOB: [birth year] Today's Date: 05/13/2015   History of Present Illness  82 y.o. male with history of myelodysplastic syndrome, GERD, prostate cancer. S/p I&D [id number] of RLE with Application of antibiotic beads. Application of vera flow wound VAC  Clinical Impression  Patient is s/p above procedure, presenting with functional limitations due to the deficits listed below (see PT Problem List). Ambulates generally well with minimal instability and no assistive device. Would like to be able to navigate full flight of steps when he returns home so we will practice this before discharge. Will follow and progress while admitted, anticipate he will do very well. Encouraged to ambulate daily with staff.       Follow Up Recommendations No PT follow up    Equipment Recommendations  None recommended by PT    Recommendations for Other Services       Precautions / Restrictions Precautions Precautions: None Restrictions Weight Bearing Restrictions: Yes RLE Weight Bearing: Weight bearing as tolerated      Mobility  Bed Mobility Overal bed mobility: Modified Independent             General bed mobility comments: extra time  Transfers Overall transfer level: Needs assistance Equipment used: None Transfers: Sit to/from Stand Sit to Stand: Supervision         General transfer comment: Supervision for safety and to manage equipment [facility] for patient  Ambulation/Gait Ambulation/Gait assistance: Supervision Ambulation Distance (Feet): 150 Feet Assistive device: None Gait Pattern/deviations: Step-through pattern;Decreased stride length;Shuffle Gait velocity: decreased Gait velocity interpretation: Below normal speed for age/gender General Gait Details: Occasional shuffle but without loss of balance. Assistance for equipment management. VC for awareness and safety. Able to step backwards for several feet  without difficulty. Safe with turns.  Stairs            Wheelchair Mobility    Modified Rankin (Stroke Patients Only)       Balance Overall balance assessment: No apparent balance deficits (not formally assessed)                                           Pertinent Vitals/Pain Pain Assessment: 0-10 Pain Score:  ("not bad" no value given) Pain Location: LLE Pain Descriptors / Indicators: Throbbing Pain Intervention(s): Monitored during session;Repositioned    Home Living Family/patient expects to be discharged to:: Private residence Living Arrangements: Spouse/significant other;Children Available Help at Discharge: Family;Available 24 hours/day Type of Home: House Home Access: Stairs to enter Entrance Stairs-Rails: None Entrance Stairs-Number of Steps: 1+1 Home Layout: Two level;Able to live on main level with bedroom/bathroom Home Equipment: Shower seat - built in;Cane - single point      Prior Function Level of Independence: Independent               Hand Dominance   Dominant Hand: Right    Extremity/Trunk Assessment   Upper Extremity Assessment: Defer to OT evaluation           Lower Extremity Assessment: RLE deficits/detail RLE Deficits / Details: wound vac placement. Able to move all extremites against gravity. Reports some numbness BIL in soles of feet       Communication   Communication: No difficulties  Cognition Arousal/Alertness: Awake/alert Behavior During Therapy: WFL for tasks assessed/performed Overall Cognitive Status: Within Functional Limits for tasks assessed  General Comments General comments (skin integrity, edema, etc.): Encouraged elevation of RLE    Exercises General Exercises - Lower Extremity Ankle Circles/Pumps: AROM;Both;10 reps;Supine      Assessment/Plan    PT Assessment Patient needs continued PT services  PT Diagnosis Abnormality of gait;Acute pain   PT  Problem List Decreased strength;Decreased activity tolerance;Decreased balance;Decreased mobility;Impaired sensation;Pain  PT Treatment Interventions Gait training;Stair training;DME instruction;Functional mobility training;Therapeutic activities;Therapeutic exercise;Balance training;Patient/family education   PT Goals (Current goals can be found in the Care Plan section) Acute Rehab PT Goals Patient Stated Goal: Go home by wednesday PT Goal Formulation: With patient Time For Goal Achievement: 05/27/15 Potential to Achieve Goals: Good    Frequency Min 3X/week   Barriers to discharge        Co-evaluation               End of Session   Activity Tolerance: Patient tolerated treatment well Patient left: in bed;with call bell/phone within reach           Time: [id number] PT Time Calculation (min) (ACUTE ONLY): 20 min   Charges:   PT Evaluation $PT Eval Low Complexity: 1 Procedure     PT G CodesEllouise Newer 05/13/2015, 5:53 PM  Camille Bal [place], [place]

## 2015-05-13 NOTE — Consult Note (Signed)
Reason for Consult: Large abscess right leg with venous stasis insufficiency chronic. Referring Physician: [name] is an 82 y.o. male.  HPI: Patient is an 82 year old gentleman who has chronic venous stasis insufficiency bilateral lower extremities has had chronic ulceration. 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 Patient presents at this time with a large tunneling abscess in his right leg. He has undergone prolonged conservative care at the wound center and is admitted at this time for treatment of the large abscess.  Past Medical History  Diagnosis Date  . MGUS (monoclonal gammopathy of unknown significance) 06/17/2011  . Cellulitis of leg, right 07/12/2013  . Ischemia of extremity 07/12/2013    Bilateral toes  2,3,4 right; 1,3 left  Vascular doppler normal for large vessel occlusion 2/15  . Heart murmur 1936  . Pancytopenia, acquired ([place]) 06/17/2011    "from my Myelodysplastic syndrome"  . GERD (gastroesophageal reflux disease)   . Osteoarthritis     "back, legs, arms, shoulders" (07/12/2013)  . MDS (myelodysplastic syndrome), low grade ([place]) 09/20/2011    Bone marrow bx 06/13/11: mild dyserythro/dymegakaryopoiesis.  Normal cytogenetics.  FISH negative for chrom 5 or 7 deletions.  Scattered small infiltrates of plasmacytoid lymphs  . Prostate cancer ([place])   . Edema, peripheral 03/21/2014  . Monocytosis 03/21/2014  . MDS (myelodysplastic syndrome), high grade ([place]) 06/13/2014    7% blasts BM bx 05/05/14  . Senile purpura ([place]) 10/18/2014  . Abscess of skin 11/17/2014    Left ankle: incise & drain 11/17/14  . Cellulitis and abscess of leg 05/12/2015    rt leg    Past Surgical History  Procedure Laterality Date  . Tonsillectomy and adenoidectomy  ~ 1939  . Robot assisted laparoscopic radical prostatectomy  ~ 2010  . Cataract extraction w/ intraocular lens  implant, bilateral Bilateral ~ 2012    Family  History  Problem Relation Age of Onset  . Other Mother   . Other Father   . Heart disease Father   . Hyperlipidemia Son     Social History:  reports that he quit smoking about 21 years ago. His smoking use included Pipe. He has never used smokeless tobacco. He reports that he drinks alcohol. He reports that he does not use illicit drugs.  Allergies:  Allergies  Allergen Reactions  . Nsaids Other (See Comments)    Nose bleeds from high doses of ibuprofen  . Azithromycin Nausea And Vomiting  . Other Other (See Comments)    Ragweed causes nasal congestion    Medications: I have reviewed the patient's current medications.  Results for orders placed or performed during the hospital encounter of 05/12/15 (from the past 48 hour(s))  Comprehensive metabolic panel     Status: Abnormal   Collection Time: 05/12/15  1:57 PM  Result Value Ref Range   Sodium 140 135 - 145 mmol/L   Potassium 3.8 3.5 - 5.1 mmol/L   Chloride 106 101 - 111 mmol/L   CO2 25 22 - 32 mmol/L   Glucose, Bld 107 (H) 65 - 99 mg/dL   BUN 18 6 - 20 mg/dL   Creatinine, Ser 0.99 0.61 - 1.24 mg/dL   Calcium 8.6 (L) 8.9 - 10.3 mg/dL   Total Protein 6.8 6.5 - 8.1 g/dL   Albumin 3.6 3.5 - 5.0 g/dL   AST 16 15 - 41 U/L   ALT 11 (L) 17 - 63 U/L  Alkaline Phosphatase 68 38 - 126 U/L   Total Bilirubin 0.5 0.3 - 1.2 mg/dL   GFR calc non Af Amer >60 >60 mL/min   GFR calc Af Amer >60 >60 mL/min    Comment: (NOTE) The eGFR has been calculated using the CKD EPI equation. This calculation has not been validated in all clinical situations. eGFR's persistently <60 mL/min signify possible Chronic Kidney Disease.    Anion gap 9 5 - 15  CBC WITH DIFFERENTIAL     Status: Abnormal   Collection Time: 05/12/15  1:57 PM  Result Value Ref Range   WBC 5.8 4.0 - 10.5 K/uL   RBC 2.65 (L) 4.22 - 5.81 MIL/uL   Hemoglobin 8.9 (L) 13.0 - 17.0 g/dL   HCT 27.6 (L) 39.0 - 52.0 %   MCV 104.2 (H) 78.0 - 100.0 fL   MCH 33.6 26.0 - 34.0 pg    MCHC 32.2 30.0 - 36.0 g/dL   RDW 16.4 (H) 11.5 - 15.5 %   Platelets 134 (L) 150 - 400 K/uL   Neutrophils Relative % 34 %   Lymphocytes Relative 19 %   Monocytes Relative 47 %   Eosinophils Relative 0 %   Basophils Relative 0 %   Neutro Abs 2.0 1.7 - 7.7 K/uL   Lymphs Abs 1.1 0.7 - 4.0 K/uL   Monocytes Absolute 2.7 (H) 0.1 - 1.0 K/uL   Eosinophils Absolute 0.0 0.0 - 0.7 K/uL   Basophils Absolute 0.0 0.0 - 0.1 K/uL  Surgical pcr screen     Status: None   Collection Time: 05/12/15 11:09 PM  Result Value Ref Range   MRSA, PCR NEGATIVE NEGATIVE   Staphylococcus aureus NEGATIVE NEGATIVE    Comment:        The Xpert SA Assay (FDA approved for NASAL specimens in patients over 2 years of age), is one component of a comprehensive surveillance program.  Test performance has been validated by [facility] for patients greater than or equal to 77 year old. It is not intended to diagnose infection nor to guide or monitor treatment.   CBC     Status: Abnormal   Collection Time: 05/13/15  6:30 AM  Result Value Ref Range   WBC 6.6 4.0 - 10.5 K/uL   RBC 2.71 (L) 4.22 - 5.81 MIL/uL   Hemoglobin 9.3 (L) 13.0 - 17.0 g/dL   HCT 28.5 (L) 39.0 - 52.0 %   MCV 105.2 (H) 78.0 - 100.0 fL   MCH 34.3 (H) 26.0 - 34.0 pg   MCHC 32.6 30.0 - 36.0 g/dL   RDW 16.5 (H) 11.5 - 15.5 %   Platelets 127 (L) 150 - 400 K/uL    No results found.  Review of Systems  All other systems reviewed and are negative.  Blood pressure 146/80, pulse 82, temperature 98.5 F (36.9 C), temperature source Oral, resp. rate 18, height 6' (1.829 m), weight 88.6 kg (195 lb 5.2 oz), SpO2 98 %. Physical Exam On examination patient has a good dorsalis pedis and posterior tibial pulse bilaterally. No plantar ulcers no toe ulcers no ischemic changes to his feet. Patient has brawny skin color changes in both legs consistent with chronic venous stasis insufficiency. He has a very small healing superficial ulcer in the left leg which  is about 5 mm diameter this is the area the states was debrided as a squamous cell cancer. Examination the right leg he has large deep tunneling wounds on the right lateral mid calf. Review of  the CT scan shows a large deep abscess approximately 40 x 20 mm in diameter. Assessment/Plan: Assessment: Large deep abscess lateral aspect right calf.  Plan: We'll have to excise the necrotic tissue around the 2 ulcers debrided deep abscess anticipate placing a vera flow wound VAC: The patient will keep this in place until Wednesday and plan for revision surgery on Wednesday. Risks and benefits were discussed including risk of the wound not healing patient states he understands wish to proceed at this time.  Macdonald,Joel V 05/13/2015, 7:30 AM

## 2015-05-13 NOTE — Anesthesia Procedure Notes (Signed)
Procedure Name: LMA Insertion Date/Time: 05/13/2015 7:37 AM Performed by: Julian Reil Pre-anesthesia Checklist: Patient identified, Emergency Drugs available, Suction available and Patient being monitored Patient Re-evaluated:Patient Re-evaluated prior to inductionOxygen Delivery Method: Circle system utilized Preoxygenation: Pre-oxygenation with 100% oxygen Intubation Type: IV induction Ventilation: Mask ventilation without difficulty LMA: LMA inserted LMA Size: 4.0 Tube type: Oral Number of attempts: 1 Placement Confirmation: positive ETCO2 and breath sounds checked- equal and bilateral Tube secured with: Tape Dental Injury: Teeth and Oropharynx as per pre-operative assessment

## 2015-05-14 MED ORDER — PANTOPRAZOLE SODIUM 40 MG PO TBEC
40.0000 mg | DELAYED_RELEASE_TABLET | Freq: Every day | ORAL | Status: DC
  Administered 2015-05-14 – 2015-05-19 (×6): 40 mg via ORAL
  Filled 2015-05-14 (×6): qty 1

## 2015-05-14 MED ORDER — IBUPROFEN 400 MG PO TABS
400.0000 mg | ORAL_TABLET | Freq: Four times a day (QID) | ORAL | Status: DC | PRN
  Administered 2015-05-14 – 2015-05-18 (×4): 400 mg via ORAL
  Filled 2015-05-14 (×4): qty 1

## 2015-05-14 MED ORDER — SACCHAROMYCES BOULARDII 250 MG PO CAPS
250.0000 mg | ORAL_CAPSULE | Freq: Two times a day (BID) | ORAL | Status: DC
  Administered 2015-05-14 – 2015-05-19 (×11): 250 mg via ORAL
  Filled 2015-05-14 (×11): qty 1

## 2015-05-14 NOTE — Progress Notes (Signed)
Pt refused his clobetasol ointment, pt said he dont want anything to be applied to it until he goes back to see his dermatologist,

## 2015-05-14 NOTE — Progress Notes (Signed)
[facility] PROGRESS NOTE    Progress Note   ELCHONON SEDIVY [id number] DOB: [birth year] DOA: 05/12/2015 PCP: Mathews Argyle, MD   Brief Narrative:   Joel Macdonald is an 82 y.o. male with history of prostate cancer and myelodysplastic syndrome comes in from his PCPs office for nonhealing and worsening abscess.  Assessment/Plan:  Right Cellulitis and abscess of leg/cellulitis: Started empirically on vancomycin and Zosyn orthopedic surgery was consulted who performed an I and D of the right leg and place wound VAC. Cultures are still pending. He continues to be afebrile with no leukocytosis. Awaiting orthopedic recommendations.  Hypothyroidism: Continue Synthroid.  History of myelodysplastic syndrome: Follow-up with [place] hematology as an outpatient.     DVT Prophylaxis - Lovenox ordered.  Family Communication: none Disposition Plan: Home 3-4 days Code Status:     Code Status Orders        Start     Ordered   05/12/15 1339  Full code   Continuous     05/12/15 1338    Code Status History    Date Active Date Inactive Code Status Order ID Comments User Context   07/12/2013  6:02 PM 07/14/2013  5:08 PM Full Code [id number]  Charlynne Cousins, MD Inpatient    Advance Directive Documentation        Most Recent Value   Type of Advance Directive  Healthcare Power of Attorney, Living will   Pre-existing out of facility DNR order (yellow form or pink MOST form)     "MOST" Form in Place?          IV Access:    Peripheral IV   Procedures and diagnostic studies:   No results found.   Medical Consultants:    None.  Anti-Infectives:   Anti-infectives    Start     Dose/Rate Route Frequency Ordered Stop   05/13/15 0748  vancomycin (VANCOCIN) powder  Status:  Discontinued       As needed 05/13/15 0804 05/13/15 0849   05/13/15 0745  gentamicin (GARAMYCIN) injection  Status:  Discontinued       As needed 05/13/15 0803 05/13/15 0849   05/13/15 0500   vancomycin (VANCOCIN) IVPB 750 mg/150 ml premix     750 mg 150 mL/hr over 60 Minutes Intravenous Every 12 hours 05/12/15 1410     05/12/15 2200  piperacillin-tazobactam (ZOSYN) IVPB 3.375 g     3.375 g 12.5 mL/hr over 240 Minutes Intravenous 3 times per day 05/12/15 1353     05/12/15 1400  vancomycin (VANCOCIN) IVPB 1000 mg/200 mL premix     1,000 mg 200 mL/hr over 60 Minutes Intravenous  Once 05/12/15 1338 05/12/15 1657   05/12/15 1400  piperacillin-tazobactam (ZOSYN) IVPB 3.375 g     3.375 g 100 mL/hr over 30 Minutes Intravenous  Once 05/12/15 1352 05/12/15 1510      Subjective:    Lonna Cobb no complains  Objective:    Filed Vitals:   05/13/15 1302 05/13/15 1323 05/13/15 2129 05/14/15 0505  BP: 148/76 167/88 138/68 131/61  Pulse: 68 77 83 89  Temp:  97.7 F (36.5 C) 98.4 F (36.9 C) 98.5 F (36.9 C)  TempSrc:  Oral Oral Oral  Resp:   18 18  Height:      Weight:      SpO2: 100% 100% 97% 97%    Intake/Output Summary (Last 24 hours) at 05/14/15 0854 Last data filed at 05/14/15 0836  Gross per 24 hour  Intake  478.5 ml  Output   2625 ml  Net -2146.5 ml   Filed Weights   05/12/15 1217  Weight: 88.6 kg (195 lb 5.2 oz)    Exam: Gen:  NAD Cardiovascular:  RRR. Chest and lungs:   CTA B/L Abdomen:  Abdomen soft, NT/ND, + BS Extremities:  Wound vac in place.   Data Reviewed:    Labs: Basic Metabolic Panel:  Recent Labs Lab 05/09/15 1027 05/12/15 1357 05/13/15 0630  NA 139 140 137  K 4.2 3.8 3.6  CL 101 106 104  CO2 23 25 23   GLUCOSE 78 107* 97  BUN 24 18 10   CREATININE 0.84 0.99 1.06  CALCIUM 8.6 8.6* 8.8*   GFR Estimated Creatinine Clearance: 59 mL/min (by C-G formula based on Cr of 1.06). Liver Function Tests:  Recent Labs Lab 05/09/15 1027 05/12/15 1357  AST 14 16  ALT 8 11*  ALKPHOS 75 68  BILITOT 0.4 0.5  PROT 6.7 6.8  ALBUMIN 4.0 3.6   No results for input(s): LIPASE, AMYLASE in the last 168 hours. No results for  input(s): AMMONIA in the last 168 hours. Coagulation profile No results for input(s): INR, PROTIME in the last 168 hours.  CBC:  Recent Labs Lab 05/08/15 1210 05/09/15 1027 05/12/15 1357 05/13/15 0630  WBC  --  5.3 5.8 6.6  NEUTROABS  --  1.1* 2.0  --   HGB 9.7*  --  8.9* 9.3*  HCT  --  28.9* 27.6* 28.5*  MCV  --  102* 104.2* 105.2*  PLT  --  154 134* 127*   Cardiac Enzymes: No results for input(s): CKTOTAL, CKMB, CKMBINDEX, TROPONINI in the last 168 hours. BNP (last 3 results) No results for input(s): PROBNP in the last 8760 hours. CBG: No results for input(s): GLUCAP in the last 168 hours. D-Dimer: No results for input(s): DDIMER in the last 72 hours. Hgb A1c: No results for input(s): HGBA1C in the last 72 hours. Lipid Profile: No results for input(s): CHOL, HDL, LDLCALC, TRIG, CHOLHDL, LDLDIRECT in the last 72 hours. Thyroid function studies: No results for input(s): TSH, T4TOTAL, T3FREE, THYROIDAB in the last 72 hours.  Invalid input(s): FREET3 Anemia work up: No results for input(s): VITAMINB12, FOLATE, FERRITIN, TIBC, IRON, RETICCTPCT in the last 72 hours. Sepsis Labs:  Recent Labs Lab 05/09/15 1027 05/12/15 1357 05/13/15 0630  WBC 5.3 5.8 6.6   Microbiology Recent Results (from the past 240 hour(s))  Surgical pcr screen     Status: None   Collection Time: 05/12/15 11:09 PM  Result Value Ref Range Status   MRSA, PCR NEGATIVE NEGATIVE Final   Staphylococcus aureus NEGATIVE NEGATIVE Final    Comment:        The Xpert SA Assay (FDA approved for NASAL specimens in patients over 44 years of age), is one component of a comprehensive surveillance program.  Test performance has been validated by [facility] for patients greater than or equal to 38 year old. It is not intended to diagnose infection nor to guide or monitor treatment.      Medications:   . clobetasol ointment  1 application Topical BID  . enoxaparin (LOVENOX) injection  40 mg  Subcutaneous Q24H  . levothyroxine  50 mcg Oral QAC breakfast  . piperacillin-tazobactam (ZOSYN)  IV  3.375 g Intravenous 3 times per day  . sodium chloride flush  3 mL Intravenous Q12H  . vancomycin  750 mg Intravenous Q12H   Continuous Infusions: . sodium chloride 10 mL/hr at  05/13/15 1214    Time spent:25 min   LOS: 2 days   Charlynne Cousins  [facility] Pager [phone]  *Please refer to [email or web address], password [email or web address] to get updated schedule on who will round on this patient, as hospitalists switch teams weekly. If 7PM-7AM, please contact night-coverage at [email or web address], password [email or web address] for any overnight needs.  05/14/2015, 8:54 AM

## 2015-05-14 NOTE — Care Management Note (Signed)
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 S/p debridement and placement of antibiotic beads on 2/4.From home with wife. Independent with ADL's. No DME usage.       Action/Plan: Plan : I & D revision on Wednesday CM to f/u with disposition needs.  Expected Discharge Date:                  Expected Discharge Plan:  Home/Self Care  In-House Referral:     Discharge planning Services  CM Consult  Post Acute Care Choice:    Choice offered to:     DME Arranged:    DME Agency:     HH Arranged:    HH Agency:     Status of Service:  In process, will continue to follow  Medicare Important Message Given:    Date Medicare IM Given:    Medicare IM give by:    Date Additional Medicare IM Given:    Additional Medicare Important Message give by:     If discussed at [place] of Stay Meetings, dates discussed:    Additional Comments: [name] (Spouse)  [phone]  [address] [place], [place] [phone] 05/14/2015, 3:44 PM

## 2015-05-14 NOTE — Progress Notes (Signed)
Pharmacy Antibiotic Note Joel Macdonald is a 81 y.o. male admitted on 05/12/2015 with chronic R lower extremity cellulitis with associated abscess. S/p debridement and placement of antibiotic beads on 2/4. Currently on day 3 of Zosyn and vancomycin.  Plan: 1. Continue Zosyn 3.375 grams IV every 8 hours and Vancomycin 750 mg Q 12 hours  2. Will obtain vancomycin trough on 2/6 prior to am dose; goal 10 - 20 3. Following along with you daily   Height: 6' (182.9 cm) Weight: 195 lb 5.2 oz (88.6 kg) IBW/kg (Calculated) : 77.6  Temp (24hrs), Avg:98.5 F (36.9 C), Min:98.4 F (36.9 C), Max:98.5 F (36.9 C)   Recent Labs Lab 05/09/15 1027 05/12/15 1357 05/13/15 0630  WBC 5.3 5.8 6.6  CREATININE 0.84 0.99 1.06    Antimicrobials this admission: Zosyn 2/3 >>  Vanc 2/3 >>  Keflex PTA  Clinda PTA  Dose adjustments this admission: 2/6 VT: px  Microbiology results: 2/4 wound cx: px  11/17/14 wound cx - MSSA   Thank you for allowing pharmacy to be a part of this patient's care.  Vincenza Hews, PharmD, BCPS 05/14/2015, 1:38 PM Pager: [phone]

## 2015-05-15 ENCOUNTER — Other Ambulatory Visit (HOSPITAL_COMMUNITY): Payer: Self-pay | Admitting: Orthopedic Surgery

## 2015-05-15 LAB — BASIC METABOLIC PANEL
ANION GAP: 11 (ref 5–15)
BUN: 13 mg/dL (ref 6–20)
CHLORIDE: 102 mmol/L (ref 101–111)
CO2: 24 mmol/L (ref 22–32)
CREATININE: 1.07 mg/dL (ref 0.61–1.24)
Calcium: 9.2 mg/dL (ref 8.9–10.3)
GFR calc non Af Amer: >60 mL/min (ref >60)
Glucose, Bld: 92 mg/dL (ref 65–99)
POTASSIUM: 3.8 mmol/L (ref 3.5–5.1)
Sodium: 137 mmol/L (ref 135–145)

## 2015-05-15 LAB — VANCOMYCIN, TROUGH: Vancomycin Tr: 12 ug/mL (ref 10.0–20.0)

## 2015-05-15 LAB — PATHOLOGIST SMEAR REVIEW

## 2015-05-15 MED ORDER — VANCOMYCIN HCL IN DEXTROSE 1-5 GM/200ML-% IV SOLN
1000.0000 mg | Freq: Two times a day (BID) | INTRAVENOUS | Status: DC
  Administered 2015-05-15 – 2015-05-16 (×3): 1000 mg via INTRAVENOUS
  Filled 2015-05-15 (×4): qty 200

## 2015-05-15 NOTE — Progress Notes (Signed)
[facility] PROGRESS NOTE    Progress Note   Joel Macdonald [id number] DOB: Mar 07, 1931 DOA: 05/12/2015 PCP: Mathews Argyle, MD   Brief Narrative:   Joel Macdonald is an 82 y.o. male with history of prostate cancer and myelodysplastic syndrome comes in from his PCPs office for nonhealing and worsening abscess.  Assessment/Plan:  Right Cellulitis and abscess of leg/cellulitis: Started empirically on vancomycin and Zosyn orthopedic surgery was consulted who performed an I and D of the right leg and place wound VAC. Cultures are still pending. He continues to be afebrile with no leukocytosis. Orthotopic reevaluate later this afternoon to see a surgical intervention as needed. Cultures continue to be negative till date.  Hypothyroidism: Continue Synthroid.  History of myelodysplastic syndrome: Follow-up with [place] hematology as an outpatient.     DVT Prophylaxis - Lovenox ordered.  Family Communication: none Disposition Plan: Home 1-2 days Code Status:     Code Status Orders        Start     Ordered   05/12/15 1339  Full code   Continuous     05/12/15 1338    Code Status History    Date Active Date Inactive Code Status Order ID Comments User Context   07/12/2013  6:02 PM 07/14/2013  5:08 PM Full Code [id number]  Charlynne Cousins, MD Inpatient    Advance Directive Documentation        Most Recent Value   Type of Advance Directive  Healthcare Power of Attorney, Living will   Pre-existing out of facility DNR order (yellow form or pink MOST form)     "MOST" Form in Place?          IV Access:    Peripheral IV   Procedures and diagnostic studies:   No results found.   Medical Consultants:    None.  Anti-Infectives:   Anti-infectives    Start     Dose/Rate Route Frequency Ordered Stop   05/15/15 0500  vancomycin (VANCOCIN) IVPB 1000 mg/200 mL premix     1,000 mg 200 mL/hr over 60 Minutes Intravenous Every 12 hours 05/15/15 0504     05/13/15 0748  vancomycin (VANCOCIN) powder  Status:  Discontinued       As needed 05/13/15 0804 05/13/15 0849   05/13/15 0745  gentamicin (GARAMYCIN) injection  Status:  Discontinued       As needed 05/13/15 0803 05/13/15 0849   05/13/15 0500  vancomycin (VANCOCIN) IVPB 750 mg/150 ml premix  Status:  Discontinued     750 mg 150 mL/hr over 60 Minutes Intravenous Every 12 hours 05/12/15 1410 05/15/15 0504   05/12/15 2200  piperacillin-tazobactam (ZOSYN) IVPB 3.375 g     3.375 g 12.5 mL/hr over 240 Minutes Intravenous 3 times per day 05/12/15 1353     05/12/15 1400  vancomycin (VANCOCIN) IVPB 1000 mg/200 mL premix     1,000 mg 200 mL/hr over 60 Minutes Intravenous  Once 05/12/15 1338 05/12/15 1657   05/12/15 1400  piperacillin-tazobactam (ZOSYN) IVPB 3.375 g     3.375 g 100 mL/hr over 30 Minutes Intravenous  Once 05/12/15 1352 05/12/15 1510      Subjective:    Joel Macdonald no complains  Objective:    Filed Vitals:   05/14/15 0505 05/14/15 1409 05/14/15 2123 05/15/15 0448  BP: 131/61 144/74 121/60 122/57  Pulse: 89 91 68 79  Temp: 98.5 F (36.9 C)  98.3 F (36.8 C) 99.4 F (37.4 C)  TempSrc: Oral  Oral  Oral  Resp: 18 20 20 18   Height:      Weight:      SpO2: 97% 97% 99% 99%    Intake/Output Summary (Last 24 hours) at 05/15/15 1056 Last data filed at 05/14/15 1827  Gross per 24 hour  Intake    990 ml  Output    900 ml  Net     90 ml   Filed Weights   05/12/15 1217  Weight: 88.6 kg (195 lb 5.2 oz)    Exam: Gen:  NAD Cardiovascular:  RRR. Chest and lungs:   CTA B/L Abdomen:  Abdomen soft, NT/ND, + BS Extremities:  Wound vac in place.   Data Reviewed:    Labs: Basic Metabolic Panel:  Recent Labs Lab 05/09/15 1027 05/12/15 1357 05/13/15 0630 05/15/15 0405  NA 139 140 137 137  K 4.2 3.8 3.6 3.8  CL 101 106 104 102  CO2 23 25 23 24   GLUCOSE 78 107* 97 92  BUN 24 18 10 13   CREATININE 0.84 0.99 1.06 1.07  CALCIUM 8.6 8.6* 8.8* 9.2    GFR Estimated Creatinine Clearance: 58.4 mL/min (by C-G formula based on Cr of 1.07). Liver Function Tests:  Recent Labs Lab 05/09/15 1027 05/12/15 1357  AST 14 16  ALT 8 11*  ALKPHOS 75 68  BILITOT 0.4 0.5  PROT 6.7 6.8  ALBUMIN 4.0 3.6   No results for input(s): LIPASE, AMYLASE in the last 168 hours. No results for input(s): AMMONIA in the last 168 hours. Coagulation profile No results for input(s): INR, PROTIME in the last 168 hours.  CBC:  Recent Labs Lab 05/08/15 1210 05/09/15 1027 05/12/15 1357 05/13/15 0630  WBC  --  5.3 5.8 6.6  NEUTROABS  --  1.1* 2.0  --   HGB 9.7*  --  8.9* 9.3*  HCT  --  28.9* 27.6* 28.5*  MCV  --  102* 104.2* 105.2*  PLT  --  154 134* 127*   Cardiac Enzymes: No results for input(s): CKTOTAL, CKMB, CKMBINDEX, TROPONINI in the last 168 hours. BNP (last 3 results) No results for input(s): PROBNP in the last 8760 hours. CBG: No results for input(s): GLUCAP in the last 168 hours. D-Dimer: No results for input(s): DDIMER in the last 72 hours. Hgb A1c: No results for input(s): HGBA1C in the last 72 hours. Lipid Profile: No results for input(s): CHOL, HDL, LDLCALC, TRIG, CHOLHDL, LDLDIRECT in the last 72 hours. Thyroid function studies: No results for input(s): TSH, T4TOTAL, T3FREE, THYROIDAB in the last 72 hours.  Invalid input(s): FREET3 Anemia work up: No results for input(s): VITAMINB12, FOLATE, FERRITIN, TIBC, IRON, RETICCTPCT in the last 72 hours. Sepsis Labs:  Recent Labs Lab 05/09/15 1027 05/12/15 1357 05/13/15 0630  WBC 5.3 5.8 6.6   Microbiology Recent Results (from the past 240 hour(s))  Surgical pcr screen     Status: None   Collection Time: 05/12/15 11:09 PM  Result Value Ref Range Status   MRSA, PCR NEGATIVE NEGATIVE Final   Staphylococcus aureus NEGATIVE NEGATIVE Final    Comment:        The Xpert SA Assay (FDA approved for NASAL specimens in patients over 22 years of age), is one component of a  comprehensive surveillance program.  Test performance has been validated by [facility] for patients greater than or equal to 59 year old. It is not intended to diagnose infection nor to guide or monitor treatment.   Anaerobic culture     Status: None (  Preliminary result)   Collection Time: 05/13/15  8:18 AM  Result Value Ref Range Status   Specimen Description ABSCESS RIGHT LEG  Final   Special Requests SPEC A  Final   Gram Stain   Final    ABUNDANT WBC PRESENT, PREDOMINANTLY PMN NO SQUAMOUS EPITHELIAL CELLS SEEN MODERATE GRAM POSITIVE COCCI IN PAIRS IN CLUSTERS IN CHAINS FEW GRAM NEGATIVE RODS Performed at [facility]    Culture PENDING  Incomplete   Report Status PENDING  Incomplete  Wound culture     Status: None (Preliminary result)   Collection Time: 05/13/15  8:18 AM  Result Value Ref Range Status   Specimen Description ABSCESS RIGHT LEG  Final   Special Requests SPEC A  Final   Gram Stain   Final    MODERATE WBC PRESENT, PREDOMINANTLY PMN NO SQUAMOUS EPITHELIAL CELLS SEEN FEW GRAM POSITIVE COCCI IN PAIRS FEW GRAM NEGATIVE RODS Performed at [facility]    Culture PENDING  Incomplete   Report Status PENDING  Incomplete     Medications:   . clobetasol ointment  1 application Topical BID  . enoxaparin (LOVENOX) injection  40 mg Subcutaneous Q24H  . levothyroxine  50 mcg Oral QAC breakfast  . pantoprazole  40 mg Oral Daily  . piperacillin-tazobactam (ZOSYN)  IV  3.375 g Intravenous 3 times per day  . saccharomyces boulardii  250 mg Oral BID  . sodium chloride flush  3 mL Intravenous Q12H  . vancomycin  1,000 mg Intravenous Q12H   Continuous Infusions: . sodium chloride 10 mL/hr at 05/13/15 1214    Time spent:15 min   LOS: 3 days   Charlynne Cousins  [facility] Pager [phone]  *Please refer to [email or web address], password [email or web address] to get updated schedule on who will round on this patient, as hospitalists switch teams weekly. If 7PM-7AM,  please contact night-coverage at [email or web address], password [email or web address] for any overnight needs.  05/15/2015, 10:56 AM

## 2015-05-15 NOTE — Progress Notes (Signed)
Patient ID: Joel Macdonald, male   DOB: May 10, 1930, 82 y.o.   MRN: [id number] Vera flow wound VAC is not functioning properly. The vera flow boxes been changed out twice the canister has been changed twice the machine reads a blockage however there is good drainage through the tube. There is no leak. We will see if the track pad can be changed out today.If changing the track pad does not work then we'll need to return to the operating room this evening for replacement of the sponge.

## 2015-05-15 NOTE — Progress Notes (Signed)
Pharmacy Antibiotic Note  Joel Macdonald is a 82 y.o. male admitted on 05/12/2015 with Ankle wound abscess/cellulitis.  Pharmacy has been consulted for Vancomycin dosing.  Plan: -Increase vancomycin to 1000 mg IV q12h -Re-check vancomycin trough at steady state  Height: 6' (182.9 cm) Weight: 195 lb 5.2 oz (88.6 kg) IBW/kg (Calculated) : 77.6  Temp (24hrs), Avg:98.7 F (37.1 C), Min:98.3 F (36.8 C), Max:99.4 F (37.4 C)   Recent Labs Lab 05/09/15 1027 05/12/15 1357 05/13/15 0630 05/15/15 0405  WBC 5.3 5.8 6.6  --   CREATININE 0.84 0.99 1.06  --   VANCOTROUGH  --   --   --  12    Estimated Creatinine Clearance: 59 mL/min (by C-G formula based on Cr of 1.06).    Allergies  Allergen Reactions  . Nsaids Other (See Comments)    Nose bleeds from high doses of ibuprofen  . Azithromycin Nausea And Vomiting  . Other Other (See Comments)    Ragweed causes nasal congestion    Thank you for allowing pharmacy to be a part of this patient's care.  [name] 05/15/2015 5:02 AM

## 2015-05-15 NOTE — Progress Notes (Signed)
Physical Therapy Treatment Patient Details Name: Joel Macdonald MRN: [id number] DOB: 1930/11/06 Today's Date: 05/15/2015    History of Present Illness 82 y.o. male with history of myelodysplastic syndrome, GERD, prostate cancer. S/p I&D 99991111 of RLE with Application of antibiotic beads. Application of vera flow wound VAC    PT Comments    Pt performed increased gait distance with trial of flight of stairs.  Pt demonstrated good safety with gait and stair training.  Plan to attempt gait training without pushing IV pole next visit to assess need for cane at d/c.    Follow Up Recommendations  No PT follow up     Equipment Recommendations  None recommended by PT    Recommendations for Other Services       Precautions / Restrictions Precautions Precautions: None Restrictions Weight Bearing Restrictions: Yes RLE Weight Bearing: Weight bearing as tolerated    Mobility  Bed Mobility Overal bed mobility: Modified Independent             General bed mobility comments: extra time  Transfers Overall transfer level: Modified independent Equipment used: None Transfers: Sit to/from Stand Sit to Stand: Modified independent (Device/Increase time);Supervision         General transfer comment: Supervision initially for IV and wound vac line lead management.  Ambulation/Gait Ambulation/Gait assistance: Supervision Ambulation Distance (Feet): 275 Feet Assistive device: None (pushed IV pole, will attempt gait training without AD next visit.) Gait Pattern/deviations: Step-through pattern;Shuffle;Steppage Gait velocity: decreased   General Gait Details: Gt unsteady but difficult to distinguish if pt's gait pattern in unsteady from weakness or trying to avoid IV pole during gait training.   Stairs Stairs: Yes Stairs assistance: Supervision Stair Management: One rail Right;Two rails (intermittent use of single rail vs. B rails) Number of Stairs: 12 General stair comments: Pt  demonstrated reciprocal pattern ascending and non-reciprocal pattern descending required S for line/lead management to avoid pulling on lines/leads.  Wheelchair Mobility    Modified Rankin (Stroke Patients Only)       Balance Overall balance assessment:  (no apparent balance deficits)                                  Cognition Arousal/Alertness: Awake/alert Behavior During Therapy: WFL for tasks assessed/performed Overall Cognitive Status: Within Functional Limits for tasks assessed                      Exercises      General Comments        Pertinent Vitals/Pain Pain Assessment: 0-10 Pain Score: 1  Pain Descriptors / Indicators: Throbbing Pain Intervention(s): Monitored during session    Home Living                      Prior Function            PT Goals (current goals can now be found in the care plan section) Acute Rehab PT Goals Potential to Achieve Goals: Good Progress towards PT goals: Progressing toward goals    Frequency  Min 3X/week    PT Plan      Co-evaluation             End of Session Equipment Utilized During Treatment: Gait belt Activity Tolerance: Patient tolerated treatment well Patient left: in bed;with call bell/phone within reach     Time: 1112-1136 PT Time Calculation (min) (ACUTE ONLY): 24 min  Charges:  $Gait Training: 23-37 mins                    G Codes:      Cristela Blue 06-12-15, 12:48 PM Governor Rooks, PTA pager [phone]

## 2015-05-16 ENCOUNTER — Ambulatory Visit: Payer: Medicare Other | Admitting: Oncology

## 2015-05-16 ENCOUNTER — Encounter (HOSPITAL_COMMUNITY): Payer: Self-pay | Admitting: Orthopedic Surgery

## 2015-05-16 LAB — WOUND CULTURE

## 2015-05-16 MED ORDER — SODIUM CHLORIDE 0.9 % IV SOLN
2.0000 g | Freq: Four times a day (QID) | INTRAVENOUS | Status: DC
  Administered 2015-05-16 – 2015-05-19 (×11): 2 g via INTRAVENOUS
  Filled 2015-05-16 (×14): qty 2000

## 2015-05-16 NOTE — Progress Notes (Signed)
[facility] PROGRESS NOTE    Progress Note   KERSHAW SPHAR [id number] DOB: 1930/07/25 DOA: 05/12/2015 PCP: Mathews Argyle, MD   Brief Narrative:   VYRON SHARPLEY is an 82 y.o. male with history of prostate cancer and myelodysplastic syndrome comes in from his PCPs office for nonhealing and worsening abscess.  Assessment/Plan:  Right Cellulitis and abscess of leg/cellulitis: Started empirically on vancomycin and Zosyn orthopedic surgery was consulted who performed an I and D of the right leg and place wound VAC. Wound culture grew enterococcus species sensitive to ampicillin. We'll DC Zosyn. Orthopedic surgery plan to takes patient surgery on 05/16/2014 for grafting.  Hypothyroidism: Continue Synthroid.  History of myelodysplastic syndrome: Follow-up with [place] hematology as an outpatient.     DVT Prophylaxis - Lovenox ordered.  Family Communication: none Disposition Plan: Home 2 days Code Status:     Code Status Orders        Start     Ordered   05/12/15 1339  Full code   Continuous     05/12/15 1338    Code Status History    Date Active Date Inactive Code Status Order ID Comments User Context   07/12/2013  6:02 PM 07/14/2013  5:08 PM Full Code [id number]  Charlynne Cousins, MD Inpatient    Advance Directive Documentation        Most Recent Value   Type of Advance Directive  Healthcare Power of Attorney, Living will   Pre-existing out of facility DNR order (yellow form or pink MOST form)     "MOST" Form in Place?          IV Access:    Peripheral IV   Procedures and diagnostic studies:   No results found.   Medical Consultants:    None.  Anti-Infectives:   Anti-infectives    Start     Dose/Rate Route Frequency Ordered Stop   05/15/15 0500  vancomycin (VANCOCIN) IVPB 1000 mg/200 mL premix     1,000 mg 200 mL/hr over 60 Minutes Intravenous Every 12 hours 05/15/15 0504     05/13/15 0748  vancomycin (VANCOCIN) powder  Status:   Discontinued       As needed 05/13/15 0804 05/13/15 0849   05/13/15 0745  gentamicin (GARAMYCIN) injection  Status:  Discontinued       As needed 05/13/15 0803 05/13/15 0849   05/13/15 0500  vancomycin (VANCOCIN) IVPB 750 mg/150 ml premix  Status:  Discontinued     750 mg 150 mL/hr over 60 Minutes Intravenous Every 12 hours 05/12/15 1410 05/15/15 0504   05/12/15 2200  piperacillin-tazobactam (ZOSYN) IVPB 3.375 g     3.375 g 12.5 mL/hr over 240 Minutes Intravenous 3 times per day 05/12/15 1353     05/12/15 1400  vancomycin (VANCOCIN) IVPB 1000 mg/200 mL premix     1,000 mg 200 mL/hr over 60 Minutes Intravenous  Once 05/12/15 1338 05/12/15 1657   05/12/15 1400  piperacillin-tazobactam (ZOSYN) IVPB 3.375 g     3.375 g 100 mL/hr over 30 Minutes Intravenous  Once 05/12/15 1352 05/12/15 1510      Subjective:    Lonna Cobb no complains, feels great.  Objective:    Filed Vitals:   05/15/15 0448 05/15/15 1256 05/15/15 2126 05/16/15 0525  BP: 122/57 139/80 132/66 148/75  Pulse: 79 84 74 74  Temp: 99.4 F (37.4 C) 98.1 F (36.7 C) 98.9 F (37.2 C) 98.1 F (36.7 C)  TempSrc: Oral Oral Oral Oral  Resp:  18 19 16 16   Height:      Weight:      SpO2: 99% 99% 99% 96%    Intake/Output Summary (Last 24 hours) at 05/16/15 1042 Last data filed at 05/16/15 0525  Gross per 24 hour  Intake    670 ml  Output   1425 ml  Net   -755 ml   Filed Weights   05/12/15 1217  Weight: 88.6 kg (195 lb 5.2 oz)    Exam: Gen:  NAD Cardiovascular:  RRR. Chest and lungs:   CTA B/L Abdomen:  Abdomen soft, NT/ND, + BS Extremities:  Wound vac in place.   Data Reviewed:    Labs: Basic Metabolic Panel:  Recent Labs Lab 05/12/15 1357 05/13/15 0630 05/15/15 0405  NA 140 137 137  K 3.8 3.6 3.8  CL 106 104 102  CO2 25 23 24   GLUCOSE 107* 97 92  BUN 18 10 13   CREATININE 0.99 1.06 1.07  CALCIUM 8.6* 8.8* 9.2   GFR Estimated Creatinine Clearance: 58.4 mL/min (by C-G formula based on  Cr of 1.07). Liver Function Tests:  Recent Labs Lab 05/12/15 1357  AST 16  ALT 11*  ALKPHOS 68  BILITOT 0.5  PROT 6.8  ALBUMIN 3.6   No results for input(s): LIPASE, AMYLASE in the last 168 hours. No results for input(s): AMMONIA in the last 168 hours. Coagulation profile No results for input(s): INR, PROTIME in the last 168 hours.  CBC:  Recent Labs Lab 05/12/15 1357 05/13/15 0630  WBC 5.8 6.6  NEUTROABS 2.0  --   HGB 8.9* 9.3*  HCT 27.6* 28.5*  MCV 104.2* 105.2*  PLT 134* 127*   Cardiac Enzymes: No results for input(s): CKTOTAL, CKMB, CKMBINDEX, TROPONINI in the last 168 hours. BNP (last 3 results) No results for input(s): PROBNP in the last 8760 hours. CBG: No results for input(s): GLUCAP in the last 168 hours. D-Dimer: No results for input(s): DDIMER in the last 72 hours. Hgb A1c: No results for input(s): HGBA1C in the last 72 hours. Lipid Profile: No results for input(s): CHOL, HDL, LDLCALC, TRIG, CHOLHDL, LDLDIRECT in the last 72 hours. Thyroid function studies: No results for input(s): TSH, T4TOTAL, T3FREE, THYROIDAB in the last 72 hours.  Invalid input(s): FREET3 Anemia work up: No results for input(s): VITAMINB12, FOLATE, FERRITIN, TIBC, IRON, RETICCTPCT in the last 72 hours. Sepsis Labs:  Recent Labs Lab 05/12/15 1357 05/13/15 0630  WBC 5.8 6.6   Microbiology Recent Results (from the past 240 hour(s))  Surgical pcr screen     Status: None   Collection Time: 05/12/15 11:09 PM  Result Value Ref Range Status   MRSA, PCR NEGATIVE NEGATIVE Final   Staphylococcus aureus NEGATIVE NEGATIVE Final    Comment:        The Xpert SA Assay (FDA approved for NASAL specimens in patients over 40 years of age), is one component of a comprehensive surveillance program.  Test performance has been validated by [facility] for patients greater than or equal to 63 year old. It is not intended to diagnose infection nor to guide or monitor treatment.     Anaerobic culture     Status: None (Preliminary result)   Collection Time: 05/13/15  8:18 AM  Result Value Ref Range Status   Specimen Description ABSCESS RIGHT LEG  Final   Special Requests SPEC A  Final   Gram Stain   Final    ABUNDANT WBC PRESENT, PREDOMINANTLY PMN NO SQUAMOUS EPITHELIAL CELLS SEEN MODERATE  GRAM POSITIVE COCCI IN PAIRS IN CLUSTERS IN CHAINS FEW GRAM NEGATIVE RODS Performed at [facility]    Culture PENDING  Incomplete   Report Status PENDING  Incomplete  Wound culture     Status: None   Collection Time: 05/13/15  8:18 AM  Result Value Ref Range Status   Specimen Description ABSCESS RIGHT LEG  Final   Special Requests SPEC A  Final   Gram Stain   Final    MODERATE WBC PRESENT, PREDOMINANTLY PMN NO SQUAMOUS EPITHELIAL CELLS SEEN FEW GRAM POSITIVE COCCI IN PAIRS FEW GRAM NEGATIVE RODS Performed at [facility]    Culture   Final    ABUNDANT ENTEROCOCCUS SPECIES Performed at [facility]    Report Status 05/16/2015 FINAL  Final   Organism ID, Bacteria ENTEROCOCCUS SPECIES  Final      Susceptibility   Enterococcus species - MIC*    VANCOMYCIN 2 SENSITIVE Sensitive     AMPICILLIN <=2 SENSITIVE Sensitive     * ABUNDANT ENTEROCOCCUS SPECIES     Medications:   . clobetasol ointment  1 application Topical BID  . enoxaparin (LOVENOX) injection  40 mg Subcutaneous Q24H  . levothyroxine  50 mcg Oral QAC breakfast  . pantoprazole  40 mg Oral Daily  . piperacillin-tazobactam (ZOSYN)  IV  3.375 g Intravenous 3 times per day  . saccharomyces boulardii  250 mg Oral BID  . sodium chloride flush  3 mL Intravenous Q12H  . vancomycin  1,000 mg Intravenous Q12H   Continuous Infusions: . sodium chloride 10 mL/hr at 05/13/15 1214    Time spent:15 min   LOS: 4 days   Charlynne Cousins  [facility] Pager [phone]  *Please refer to [email or web address], password [email or web address] to get updated schedule on who will round on this patient, as  hospitalists switch teams weekly. If 7PM-7AM, please contact night-coverage at [email or web address], password [email or web address] for any overnight needs.  05/16/2015, 10:42 AM

## 2015-05-16 NOTE — Progress Notes (Signed)
Physical Therapy Treatment Patient Details Name: Joel Macdonald MRN: [id number] DOB: Feb 01, 1931 Today's Date: 05/16/2015    History of Present Illness 82 y.o. male with history of myelodysplastic syndrome, GERD, prostate cancer. S/p I&D 99991111 of RLE with Application of antibiotic beads. Application of vera flow wound VAC    PT Comments    Pt performed increased gt distance with decreased assist and no use of AD.  Pt able to self correct staggering.  Pt performed stair training with decreased cueing but remains to require cues for and S for line lead management during stair training.    Follow Up Recommendations  No PT follow up     Equipment Recommendations  None recommended by PT    Recommendations for Other Services       Precautions / Restrictions Precautions Precautions: None Restrictions Weight Bearing Restrictions: Yes RLE Weight Bearing: Weight bearing as tolerated    Mobility  Bed Mobility Overal bed mobility: Independent                Transfers Overall transfer level: Modified independent Equipment used: None Transfers: Sit to/from Stand Sit to Stand: Modified independent (Device/Increase time)         General transfer comment: Pt very aware of lines and leads during transfer techniques.    Ambulation/Gait Ambulation/Gait assistance: Supervision Ambulation Distance (Feet): 320 Feet Assistive device: None (PTA pushed IV pole and wound vacx during session to assess gait without AD.  Pt demo's weaving with slight staggering but able to correct without LOB.  ) Gait Pattern/deviations: Staggering left;Staggering right;Step-through pattern   Gait velocity interpretation: Below normal speed for age/gender General Gait Details: Gait unsteady but able to self correct, Required cues for reciprocal armswing during gait to improve balance and righting.     Stairs   Stairs assistance: Supervision Stair Management: One rail Right Number of Stairs: 13 (cues  for pacing and turning to maintain safety.  ) General stair comments: Pt demonstrated reciprocal pattern ascending and non-reciprocal pattern descending required S for line/lead management to avoid pulling on lines/leads.  Wheelchair Mobility    Modified Rankin (Stroke Patients Only)       Balance Overall balance assessment:  (no apparent balance deficits.  )                                  Cognition Arousal/Alertness: Awake/alert Behavior During Therapy: WFL for tasks assessed/performed Overall Cognitive Status: Within Functional Limits for tasks assessed                      Exercises      General Comments        Pertinent Vitals/Pain Pain Assessment: 0-10 Pain Score: 1  Pain Location: RLE Pain Descriptors / Indicators: Burning Pain Intervention(s): Monitored during session    Home Living                      Prior Function            PT Goals (current goals can now be found in the care plan section) Acute Rehab PT Goals Potential to Achieve Goals: Good Progress towards PT goals: Progressing toward goals    Frequency  Min 3X/week    PT Plan      Co-evaluation             End of Session Equipment Utilized During Treatment: Gait belt  Activity Tolerance: Patient tolerated treatment well Patient left: in bed;with call bell/phone within reach     Time: 1003-1019 PT Time Calculation (min) (ACUTE ONLY): 16 min  Charges:  $Gait Training: 8-22 mins                    G Codes:      Cristela Blue 2015/06/01, 10:30 AM Governor Rooks, PTA pager [phone]

## 2015-05-16 NOTE — Progress Notes (Signed)
Pharmacy Antibiotic Note  Joel Macdonald is a 82 y.o. male admitted on 05/12/2015 with Ankle wound abscess/cellulitis, now with enterococcus in wound culture, sensitive to ampicillin. Pharmacy has been consulted to switch V/Z to ampicillin. Recent surgical debridement, repeat I&D 2/4 with placement of abx beads- afebrile, WBC WNL.  Ampicillin >> Zosyn 2/3 >>2/7 Vanc 2/3 >>2/7 Keflex PTA Clinda PTA  2/4 wound cx: enterococcus (S-amp) 11/17/14 wound cx - MSSA  VT 2/6: 12 on 750 q12h  Plan: - Ampicillin 2g IV q6h - Monitor clinical progress, c/s, renal function, abx plan/LOT   Height: 6' (182.9 cm) Weight: 195 lb 5.2 oz (88.6 kg) IBW/kg (Calculated) : 77.6  Temp (24hrs), Avg:98.5 F (36.9 C), Min:98.1 F (36.7 C), Max:98.9 F (37.2 C)   Recent Labs Lab 05/12/15 1357 05/13/15 0630 05/15/15 0405  WBC 5.8 6.6  --   CREATININE 0.99 1.06 1.07  VANCOTROUGH  --   --  12    Estimated Creatinine Clearance: 58.4 mL/min (by C-G formula based on Cr of 1.07).    Allergies  Allergen Reactions  . Nsaids Other (See Comments)    Nose bleeds from high doses of ibuprofen  . Azithromycin Nausea And Vomiting  . Other Other (See Comments)    Ragweed causes nasal congestion    Elicia Lamp, PharmD, [facility] Clinical Pharmacist Pager [phone] 05/16/2015 1:01 PM

## 2015-05-17 ENCOUNTER — Inpatient Hospital Stay (HOSPITAL_COMMUNITY): Payer: Medicare Other | Admitting: Anesthesiology

## 2015-05-17 ENCOUNTER — Encounter (HOSPITAL_COMMUNITY)
Admission: AD | Disposition: A | Discharge status: Home / Self Care | Payer: Self-pay | Source: Ambulatory Visit | Attending: Internal Medicine

## 2015-05-17 ENCOUNTER — Encounter (HOSPITAL_COMMUNITY): Payer: Self-pay | Admitting: Anesthesiology

## 2015-05-17 DIAGNOSIS — L02419 Cutaneous abscess of limb, unspecified: Secondary | ICD-10-CM

## 2015-05-17 DIAGNOSIS — L02415 Cutaneous abscess of right lower limb: Principal | ICD-10-CM

## 2015-05-17 DIAGNOSIS — I739 Peripheral vascular disease, unspecified: Secondary | ICD-10-CM

## 2015-05-17 DIAGNOSIS — L03119 Cellulitis of unspecified part of limb: Secondary | ICD-10-CM

## 2015-05-17 DIAGNOSIS — L03115 Cellulitis of right lower limb: Secondary | ICD-10-CM

## 2015-05-17 DIAGNOSIS — L0291 Cutaneous abscess, unspecified: Secondary | ICD-10-CM

## 2015-05-17 HISTORY — PX: I&D EXTREMITY: SHX5045

## 2015-05-17 HISTORY — PX: APPLICATION OF WOUND VAC: SHX5189

## 2015-05-17 SURGERY — IRRIGATION AND DEBRIDEMENT EXTREMITY
Anesthesia: General | Site: Leg Lower | Laterality: Right

## 2015-05-17 MED ORDER — ONDANSETRON HCL 4 MG PO TABS: 4.0000 mg | ORAL_TABLET | Freq: Four times a day (QID) | ORAL | Status: DC | PRN

## 2015-05-17 MED ORDER — ACETAMINOPHEN 325 MG PO TABS
650.0000 mg | ORAL_TABLET | Freq: Four times a day (QID) | ORAL | Status: DC | PRN
  Administered 2015-05-17: 650 mg via ORAL
  Filled 2015-05-17 (×2): qty 2

## 2015-05-17 MED ORDER — HYDROMORPHONE HCL 1 MG/ML IJ SOLN
INTRAMUSCULAR | Status: AC
  Filled 2015-05-17: qty 1

## 2015-05-17 MED ORDER — PROMETHAZINE HCL 25 MG/ML IJ SOLN: 6.2500 mg | INTRAMUSCULAR | Status: DC | PRN

## 2015-05-17 MED ORDER — LACTATED RINGERS IV SOLN
INTRAVENOUS | Status: DC
  Administered 2015-05-17: 10:00:00 via INTRAVENOUS

## 2015-05-17 MED ORDER — PHENYLEPHRINE 40 MCG/ML (10ML) SYRINGE FOR IV PUSH (FOR BLOOD PRESSURE SUPPORT)
PREFILLED_SYRINGE | INTRAVENOUS | Status: AC
  Filled 2015-05-17: qty 10

## 2015-05-17 MED ORDER — ONDANSETRON HCL 4 MG/2ML IJ SOLN
INTRAMUSCULAR | Status: DC | PRN
  Administered 2015-05-17: 4 mg via INTRAVENOUS

## 2015-05-17 MED ORDER — SODIUM CHLORIDE 0.9 % IV SOLN
INTRAVENOUS | Status: DC
  Administered 2015-05-17 – 2015-05-18 (×2): via INTRAVENOUS

## 2015-05-17 MED ORDER — FENTANYL CITRATE (PF) 250 MCG/5ML IJ SOLN
INTRAMUSCULAR | Status: AC
  Filled 2015-05-17: qty 5

## 2015-05-17 MED ORDER — PROPOFOL 10 MG/ML IV BOLUS
INTRAVENOUS | Status: DC | PRN
  Administered 2015-05-17: 170 mg via INTRAVENOUS

## 2015-05-17 MED ORDER — LIDOCAINE HCL (CARDIAC) 20 MG/ML IV SOLN
INTRAVENOUS | Status: DC | PRN
  Administered 2015-05-17: 70 mg via INTRAVENOUS

## 2015-05-17 MED ORDER — METOCLOPRAMIDE HCL 10 MG PO TABS: 5.0000 mg | ORAL_TABLET | Freq: Three times a day (TID) | ORAL | Status: DC | PRN

## 2015-05-17 MED ORDER — METOCLOPRAMIDE HCL 5 MG/ML IJ SOLN: 5.0000 mg | Freq: Three times a day (TID) | INTRAMUSCULAR | Status: DC | PRN

## 2015-05-17 MED ORDER — FENTANYL CITRATE (PF) 100 MCG/2ML IJ SOLN
INTRAMUSCULAR | Status: AC
  Filled 2015-05-17: qty 2

## 2015-05-17 MED ORDER — ACETAMINOPHEN 650 MG RE SUPP: 650.0000 mg | Freq: Four times a day (QID) | RECTAL | Status: DC | PRN

## 2015-05-17 MED ORDER — FENTANYL CITRATE (PF) 100 MCG/2ML IJ SOLN
25.0000 ug | INTRAMUSCULAR | Status: DC | PRN
  Administered 2015-05-17: 25 ug via INTRAVENOUS

## 2015-05-17 MED ORDER — FENTANYL CITRATE (PF) 100 MCG/2ML IJ SOLN
INTRAMUSCULAR | Status: DC | PRN
  Administered 2015-05-17 (×3): 50 ug via INTRAVENOUS

## 2015-05-17 MED ORDER — METHOCARBAMOL 1000 MG/10ML IJ SOLN
500.0000 mg | Freq: Four times a day (QID) | INTRAVENOUS | Status: DC | PRN
  Filled 2015-05-17: qty 5

## 2015-05-17 MED ORDER — LIDOCAINE HCL (CARDIAC) 20 MG/ML IV SOLN
INTRAVENOUS | Status: AC
  Filled 2015-05-17: qty 5

## 2015-05-17 MED ORDER — PROPOFOL 10 MG/ML IV BOLUS
INTRAVENOUS | Status: AC
  Filled 2015-05-17: qty 20

## 2015-05-17 MED ORDER — ONDANSETRON HCL 4 MG/2ML IJ SOLN: 4.0000 mg | Freq: Four times a day (QID) | INTRAMUSCULAR | Status: DC | PRN

## 2015-05-17 MED ORDER — PHENYLEPHRINE HCL 10 MG/ML IJ SOLN
INTRAMUSCULAR | Status: DC | PRN
  Administered 2015-05-17 (×2): 40 ug via INTRAVENOUS

## 2015-05-17 MED ORDER — FENTANYL CITRATE (PF) 100 MCG/2ML IJ SOLN: 25.0000 ug | INTRAMUSCULAR | Status: DC | PRN

## 2015-05-17 MED ORDER — 0.9 % SODIUM CHLORIDE (POUR BTL) OPTIME
TOPICAL | Status: DC | PRN
  Administered 2015-05-17: 1000 mL

## 2015-05-17 MED ORDER — METHOCARBAMOL 500 MG PO TABS: 500.0000 mg | ORAL_TABLET | Freq: Four times a day (QID) | ORAL | Status: DC | PRN

## 2015-05-17 SURGICAL SUPPLY — 46 items
BLADE SURG 10 STRL SS (BLADE) IMPLANT
BLADE SURG 21 STRL SS (BLADE) ×2 IMPLANT
BNDG COHESIVE 4X5 TAN STRL (GAUZE/BANDAGES/DRESSINGS) IMPLANT
BNDG COHESIVE 6X5 TAN STRL LF (GAUZE/BANDAGES/DRESSINGS) IMPLANT
BNDG GAUZE ELAST 4 BULKY (GAUZE/BANDAGES/DRESSINGS) ×4 IMPLANT
COVER SURGICAL LIGHT HANDLE (MISCELLANEOUS) ×4 IMPLANT
DRAPE INCISE IOBAN 66X45 STRL (DRAPES) ×1 IMPLANT
DRAPE U-SHAPE 47X51 STRL (DRAPES) ×2 IMPLANT
DRSG ADAPTIC 3X8 NADH LF (GAUZE/BANDAGES/DRESSINGS) ×2 IMPLANT
DRSG MEPITEL 4X7.2 (GAUZE/BANDAGES/DRESSINGS) ×2 IMPLANT
DURAPREP 26ML APPLICATOR (WOUND CARE) ×2 IMPLANT
ELECT CAUTERY BLADE 6.4 (BLADE) IMPLANT
ELECT REM PT RETURN 9FT ADLT (ELECTROSURGICAL)
ELECTRODE REM PT RTRN 9FT ADLT (ELECTROSURGICAL) IMPLANT
GAUZE SPONGE 4X4 12PLY STRL (GAUZE/BANDAGES/DRESSINGS) ×2 IMPLANT
GLOVE BIOGEL PI IND STRL 9 (GLOVE) ×1 IMPLANT
GLOVE BIOGEL PI INDICATOR 9 (GLOVE) ×1
GLOVE SURG ORTHO 9.0 STRL STRW (GLOVE) ×2 IMPLANT
GOWN STRL REUS W/ TWL LRG LVL3 (GOWN DISPOSABLE) ×1 IMPLANT
GOWN STRL REUS W/ TWL XL LVL3 (GOWN DISPOSABLE) ×2 IMPLANT
GOWN STRL REUS W/TWL LRG LVL3 (GOWN DISPOSABLE) ×2
GOWN STRL REUS W/TWL XL LVL3 (GOWN DISPOSABLE) ×4
GRAFT TISS THERASKIN 1X2 (Tissue) IMPLANT
HANDPIECE INTERPULSE COAX TIP (DISPOSABLE)
KIT BASIN OR (CUSTOM PROCEDURE TRAY) ×2 IMPLANT
KIT PREVENA INCISION MGT 13 (CANNISTER) ×1 IMPLANT
KIT PREVENA INCISION MGT20CM45 (CANNISTER) ×1 IMPLANT
KIT ROOM TURNOVER OR (KITS) ×2 IMPLANT
MANIFOLD NEPTUNE II (INSTRUMENTS) ×2 IMPLANT
MATRIX SURGICAL PSM 10X15CM (Tissue) ×1 IMPLANT
MICROMATRIX 1000MG (Tissue) ×2 IMPLANT
NS IRRIG 1000ML POUR BTL (IV SOLUTION) ×2 IMPLANT
PACK ORTHO EXTREMITY (CUSTOM PROCEDURE TRAY) ×2 IMPLANT
PAD ARMBOARD 7.5X6 YLW CONV (MISCELLANEOUS) ×4 IMPLANT
SET HNDPC FAN SPRY TIP SCT (DISPOSABLE) IMPLANT
SOLUTION PARTIC MCRMTRX 1000MG (Tissue) IMPLANT
STOCKINETTE IMPERVIOUS 9X36 MD (GAUZE/BANDAGES/DRESSINGS) IMPLANT
SWAB COLLECTION DEVICE MRSA (MISCELLANEOUS) ×2 IMPLANT
TISSUE THERASKIN 1X2 (Tissue) ×8 IMPLANT
TOWEL OR 17X24 6PK STRL BLUE (TOWEL DISPOSABLE) ×2 IMPLANT
TOWEL OR 17X26 10 PK STRL BLUE (TOWEL DISPOSABLE) ×2 IMPLANT
TUBE ANAEROBIC SPECIMEN COL (MISCELLANEOUS) IMPLANT
TUBE CONNECTING 12X1/4 (SUCTIONS) ×2 IMPLANT
UNDERPAD 30X30 INCONTINENT (UNDERPADS AND DIAPERS) ×2 IMPLANT
WATER STERILE IRR 1000ML POUR (IV SOLUTION) ×2 IMPLANT
YANKAUER SUCT BULB TIP NO VENT (SUCTIONS) ×2 IMPLANT

## 2015-05-17 NOTE — H&P (View-Only) (Signed)
Patient ID: Joel Macdonald, male   DOB: 1930-10-19, 82 y.o.   MRN: [id number] Vera flow wound VAC is not functioning properly. The vera flow boxes been changed out twice the canister has been changed twice the machine reads a blockage however there is good drainage through the tube. There is no leak. We will see if the track pad can be changed out today.If changing the track pad does not work then we'll need to return to the operating room this evening for replacement of the sponge.

## 2015-05-17 NOTE — Progress Notes (Signed)
PROGRESS NOTE  Joel Macdonald [id number] DOB: [birth year] DOA: 05/12/2015 PCP: Mathews Argyle, MD  HPI: Joel Macdonald is an 82 y.o. male with history of prostate cancer s/p radical prostatectomy, myelodysplastic syndrome, recurrent cellulitis, GERD, and hypothyroidism, who presented from his PCPs office for nonhealing and worsening RLE abscess.  Subjective / 24 H Interval events Dr. Tamala Julian was seen this morning prior to his surgery. He is doing very well today with no chest pain, dyspnea, abdominal pain or cough.   Assessment/Plan: Active Problems:   Cellulitis of leg, left   Cellulitis and abscess of leg   Peripheral vascular disease, unspecified (HCC)   MDS (myelodysplastic syndrome), high grade (HCC)   Abscess   Right Leg Cellulitis with Abscess, failing outpatient therapy - Started initially empirically on vancomycin and Zosyn - Orthopedic surgery performed I&D and placed wound VAC 05/13/2015 followed by wound debridement and skin graft placement 05/17/2015 - Wound culture grew enterococcus species, narrowed to ampicillin - will discuss with ID today   History of myelodysplastic syndrome with pancytopenia - Hgb stable at baseline, 9.3 with last reading, will repeat CBC tomorrow AM - Follow-up with hematology as an outpatient.  History of Prostate CA s/p prostatectomy - Follows with Urologist as outpatient  Hypothyroidism - Continue Synthroid  GERD: -Continue PPI   Diet: Resume carb modified diet as tolerated post-operatively  Fluids: LRs at present DVT Prophylaxis: Resume lovenox when ok with surgery  Code Status: Full Code Family Communication: None at bedside Disposition Plan: likely home when clinically improved Barriers to discharge: Post-op recovery, arrangement of antibiotic regimen  Consultants:  Orthopedics, ID  Procedures:  Right leg abscess I&D 05/13/2015  Right leg abscess debridement and skin graft placement  05/17/2015   Antibiotics Ampicillin  05/16/2015 >> Zosyn   05/12/2015 >> 05/16/2015 Vancomycin  05/12/2015 >> 05/16/2015    Studies  No results found.  Objective  Filed Vitals:   05/17/15 1230 05/17/15 1232 05/17/15 1235 05/17/15 1303  BP:  147/79  147/92  Pulse: 69 68  70  Temp:   97.3 F (36.3 C) 98.3 F (36.8 C)  TempSrc:    Oral  Resp: 11 13    Height:      Weight:      SpO2: 100% 100%  100%    Intake/Output Summary (Last 24 hours) at 05/17/15 1306 Last data filed at 05/17/15 1244  Gross per 24 hour  Intake  719.5 ml  Output   1200 ml  Net -480.5 ml   Filed Weights   05/12/15 1217  Weight: 88.6 kg (195 lb 5.2 oz)    Exam:  GENERAL: NAD  HEENT: no scleral icterus, PERRL  NECK: supple, no LAD  LUNGS: CTA biL, no wheezing  HEART: RRR without MRG  ABDOMEN: soft, non tender  EXTREMITIES: no clubbing / cyanosis  NEUROLOGIC: non focal  PSYCHIATRIC: normal mood and affect  SKIN: RLE with broad erythema and hyperpigmentation, Wound Vac in place over lateral abscess site; LLE with hyperpigmentation and small <1cm lesion without drainage on medial calf. Well healed surgical scar over left scapula.  Data Reviewed: Basic Metabolic Panel:  Recent Labs Lab 05/12/15 1357 05/13/15 0630 05/15/15 0405  NA 140 137 137  K 3.8 3.6 3.8  CL 106 104 102  CO2 25 23 24   GLUCOSE 107* 97 92  BUN 18 10 13   CREATININE 0.99 1.06 1.07  CALCIUM 8.6* 8.8* 9.2   Liver Function Tests:  Recent Labs Lab 05/12/15 1357  AST 16  ALT 11*  ALKPHOS 68  BILITOT 0.5  PROT 6.8  ALBUMIN 3.6   CBC:  Recent Labs Lab 05/12/15 1357 05/13/15 0630  WBC 5.8 6.6  NEUTROABS 2.0  --   HGB 8.9* 9.3*  HCT 27.6* 28.5*  MCV 104.2* 105.2*  PLT 134* 127*    Recent Results (from the past 240 hour(s))  Surgical pcr screen     Status: None   Collection Time: 05/12/15 11:09 PM  Result Value Ref Range Status   MRSA, PCR NEGATIVE NEGATIVE Final   Staphylococcus aureus NEGATIVE  NEGATIVE Final    Comment:        The Xpert SA Assay (FDA approved for NASAL specimens in patients over 56 years of age), is one component of a comprehensive surveillance program.  Test performance has been validated by [facility] for patients greater than or equal to 87 year old. It is not intended to diagnose infection nor to guide or monitor treatment.   Anaerobic culture     Status: None (Preliminary result)   Collection Time: 05/13/15  8:18 AM  Result Value Ref Range Status   Specimen Description ABSCESS RIGHT LEG  Final   Special Requests SPEC A  Final   Gram Stain   Final    ABUNDANT WBC PRESENT, PREDOMINANTLY PMN NO SQUAMOUS EPITHELIAL CELLS SEEN MODERATE GRAM POSITIVE COCCI IN PAIRS IN CLUSTERS IN CHAINS FEW GRAM NEGATIVE RODS Performed at [facility]    Culture   Final    NO ANAEROBES ISOLATED; CULTURE IN PROGRESS FOR 5 DAYS Performed at [facility]    Report Status PENDING  Incomplete  Wound culture     Status: None   Collection Time: 05/13/15  8:18 AM  Result Value Ref Range Status   Specimen Description ABSCESS RIGHT LEG  Final   Special Requests SPEC A  Final   Gram Stain   Final    MODERATE WBC PRESENT, PREDOMINANTLY PMN NO SQUAMOUS EPITHELIAL CELLS SEEN FEW GRAM POSITIVE COCCI IN PAIRS FEW GRAM NEGATIVE RODS Performed at [facility]    Culture   Final    ABUNDANT ENTEROCOCCUS SPECIES Performed at [facility]    Report Status 05/16/2015 FINAL  Final   Organism ID, Bacteria ENTEROCOCCUS SPECIES  Final      Susceptibility   Enterococcus species - MIC*    VANCOMYCIN 2 SENSITIVE Sensitive     AMPICILLIN <=2 SENSITIVE Sensitive     * ABUNDANT ENTEROCOCCUS SPECIES     Scheduled Meds: . ampicillin (OMNIPEN) IV  2 g Intravenous 4 times per day  . clobetasol ointment  1 application Topical BID  . enoxaparin (LOVENOX) injection  40 mg Subcutaneous Q24H  . levothyroxine  50 mcg Oral QAC breakfast  . pantoprazole  40  mg Oral Daily  . saccharomyces boulardii  250 mg Oral BID  . sodium chloride flush  3 mL Intravenous Q12H   Continuous Infusions: . sodium chloride 10 mL/hr at 05/13/15 1214  . sodium chloride    . lactated ringers      Marzetta Board, MD [facility] Pager [phone]. If 7 PM - 7 AM, please contact night-coverage at [email or web address], password [facility] 05/17/2015, 1:06 PM  LOS: 5 days

## 2015-05-17 NOTE — Transfer of Care (Signed)
Immediate Anesthesia Transfer of Care Note  Patient: Joel Macdonald  Procedure(s) Performed: Procedure(s): IRRIGATION AND DEBRIDEMENT RIGHT LEG, APPLY SKIN GRAFT  (Right) VAC placement (Right)  Patient Location: PACU  Anesthesia Type:General  Level of Consciousness: awake, oriented and patient cooperative  Airway & Oxygen Therapy: Patient Spontanous Breathing and Patient connected to nasal cannula oxygen  Post-op Assessment: Report given to RN and Post -op Vital signs reviewed and stable  Post vital signs: Reviewed  Last Vitals:  Filed Vitals:   05/16/15 2206 05/17/15 0558  BP:  139/71  Pulse: 71 72  Temp: 36.9 C 36.7 C  Resp:  16    Complications: No apparent anesthesia complications

## 2015-05-17 NOTE — Anesthesia Procedure Notes (Signed)
Procedure Name: LMA Insertion Date/Time: 05/17/2015 10:15 AM Performed by: Luciana Axe K Pre-anesthesia Checklist: Patient identified, Emergency Drugs available, Suction available, Patient being monitored and Timeout performed Patient Re-evaluated:Patient Re-evaluated prior to inductionOxygen Delivery Method: Circle system utilized Preoxygenation: Pre-oxygenation with 100% oxygen Intubation Type: IV induction Ventilation: Mask ventilation without difficulty LMA: LMA inserted LMA Size: 4.0 Number of attempts: 1 Placement Confirmation: positive ETCO2,  CO2 detector and breath sounds checked- equal and bilateral Tube secured with: Tape Dental Injury: Teeth and Oropharynx as per pre-operative assessment

## 2015-05-17 NOTE — Anesthesia Postprocedure Evaluation (Signed)
Anesthesia Post Note  Patient: Joel Macdonald  Procedure(s) Performed: Procedure(s) (LRB): IRRIGATION AND DEBRIDEMENT RIGHT LEG, APPLY SKIN GRAFT  (Right) VAC placement (Right)  Patient location during evaluation: PACU Anesthesia Type: General Level of consciousness: awake Pain management: pain level controlled Vital Signs Assessment: post-procedure vital signs reviewed and stable Respiratory status: spontaneous breathing Cardiovascular status: stable Anesthetic complications: no    Last Vitals:  Filed Vitals:   05/17/15 1235 05/17/15 1303  BP:  147/92  Pulse:  70  Temp: 36.3 C 36.8 C  Resp:      Last Pain:  Filed Vitals:   05/17/15 1303  PainSc: 4                  EDWARDS,Audia Amick

## 2015-05-17 NOTE — Op Note (Signed)
05/12/2015 - 05/17/2015  11:08 AM  PATIENT:  Joel Macdonald    PRE-OPERATIVE DIAGNOSIS:  Right Calf Wound  POST-OPERATIVE DIAGNOSIS:  Same  PROCEDURE:  Excision skin soft tissue and muscle with a 10 blade knife final wound dimensions 13 x 13 cm x 5 mm deep, APPLY SKIN GRAFT both a cell and TheraSkin , Prevena VAC placement  SURGEON:  Newt Minion, MD  PHYSICIAN ASSISTANT:None ANESTHESIA:   General  PREOPERATIVE INDICATIONS:  Joel Macdonald is a  82 y.o. male with a diagnosis of Right Calf Wound who failed conservative measures and elected for surgical management.    The risks benefits and alternatives were discussed with the patient preoperatively including but not limited to the risks of infection, bleeding, nerve injury, cardiopulmonary complications, the need for revision surgery, among others, and the patient was willing to proceed.  OPERATIVE IMPLANTS: 10 x 12 a cell sheet, 1000 mg a cell powder, 4 units of the TheraSkin 1 x 3" skin graft, Prevena wound VAC  OPERATIVE FINDINGS: Large area of necrotic skin around the wound no signs of recurrent infection, cultures obtained 2.  OPERATIVE PROCEDURE: Patient was brought to the operating room and underwent general anesthetic. After adequate levels anesthesia obtained patient's right lower extremity had the wound VAC removed and the leg was prepped using Betadine paint and draped into a sterile field. A timeout was called. On examination patient had significant amount of necrotic skin surrounding the wound. Using a 10 blade knife this skin was debrided back to healthy viable skin. The final wound dimensions were 13 x 13 cm and 5 mm deep. Using a curette the fascia and muscle was further debrided back to good bleeding granulation tissue there is no signs of purulence no signs of abscess all the antibiotic beads were removed. The wound was irrigated with normal saline. A cell powder was then applied to the entire wound for of the TheraSkin skin  grafts were applied this was then covered with an a cell sheet these were held in place with staples. 2 Prevena vacs were placed side-by-side to allow for coverage of the wound. This had a good suction fit at -75 mmHg. H was extubated taken to the PACU in stable condition.  Infectious disease was consulted due to multiple organisms cultured from the patient's lower extremities last year staph aureus and deep operative cultures from February 4 were positive for enterococcus. Anticipate patient will need to be discharged on prolonged antibiotics IV.

## 2015-05-17 NOTE — Progress Notes (Signed)
UR COMPLETED  

## 2015-05-17 NOTE — Interval H&P Note (Signed)
History and Physical Interval Note:  05/17/2015 6:39 AM  Joel Macdonald  has presented today for surgery, with the diagnosis of Right Calf Wound  The various methods of treatment have been discussed with the patient and family. After consideration of risks, benefits and other options for treatment, the patient has consented to  Procedure(s): IRRIGATION AND DEBRIDEMENT RIGHT LEG, APPLY SKIN GRAFT  (Right) AND VAC  (Right) as a surgical intervention .  The patient's history has been reviewed, patient examined, no change in status, stable for surgery.  I have reviewed the patient's chart and labs.  Questions were answered to the patient's satisfaction.     DUDA,MARCUS V

## 2015-05-17 NOTE — Consult Note (Addendum)
[place] for Infectious Disease  Total days of antibiotics 6        Day 2 ampicillin               Reason for Consult: leg abcess and cellulitis in immunocompromised host    Referring Physician: [email or web address]  Active Problems:   Cellulitis of leg, left   Cellulitis and abscess of leg   Peripheral vascular disease, unspecified ([place])   MDS (myelodysplastic syndrome), high grade ([place])   Abscess    HPI: Joel Macdonald is a 82 y.o. 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 He reports that he first noticed a small lesion to his right lower leg during the week of christmas, with leg swelling, warmth and associated chills. Initially treated with doxycycline by his PCP. Due to poor response, he was switched to cephalexin. Since he still did not have impressive improvement, he was referred to wound care. Clindamycin was added in addition to cephalexin which he took up until the day of admit. His PCP ordered CT of leg concerning for poor response and imaging revealed abscess. He was admitted on 2/3 for management of cellulitis and deep tissue infection. He has been debrided on 2/4 and on 2/8 which now showing good tissue bed. OR culture showing amp S enterococcus. OR report measurement of wound is 13 x 13 cm. His abtx changed from vanco/piptazo to ampicillin. He would like to go home soon to care for his wife who has symptomatic AS soon to get TAVR.  Past Medical History  Diagnosis Date  . MGUS (monoclonal gammopathy of unknown significance) 06/17/2011  . Cellulitis of leg, right 07/12/2013  . Ischemia of extremity 07/12/2013    Bilateral toes  2,3,4 right; 1,3 left  Vascular doppler normal for large vessel occlusion 2/15  . Heart murmur 1936  . Pancytopenia, acquired ([place]) 06/17/2011    "from my Myelodysplastic syndrome"  . GERD (gastroesophageal reflux disease)   . Osteoarthritis     "back, legs, arms,  shoulders" (07/12/2013)  . MDS (myelodysplastic syndrome), low grade ([place]) 09/20/2011    Bone marrow bx 06/13/11: mild dyserythro/dymegakaryopoiesis.  Normal cytogenetics.  FISH negative for chrom 5 or 7 deletions.  Scattered small infiltrates of plasmacytoid lymphs  . Prostate cancer ([place])   . Edema, peripheral 03/21/2014  . Monocytosis 03/21/2014  . MDS (myelodysplastic syndrome), high grade ([place]) 06/13/2014    7% blasts BM bx 05/05/14  . Senile purpura ([place]) 10/18/2014  . Abscess of skin 11/17/2014    Left ankle: incise & drain 11/17/14  . Cellulitis and abscess of leg 05/12/2015    rt leg    Allergies:  Allergies  Allergen Reactions  . Nsaids Other (See Comments)    Nose bleeds from high doses of ibuprofen  . Azithromycin Nausea And Vomiting  . Other Other (See Comments)    Ragweed causes nasal congestion    MEDICATIONS: . ampicillin (OMNIPEN) IV  2 g Intravenous 4 times per day  . clobetasol ointment  1 application Topical BID  . enoxaparin (LOVENOX) injection  40 mg Subcutaneous Q24H  . levothyroxine  50 mcg Oral QAC breakfast  . pantoprazole  40 mg Oral Daily  . saccharomyces boulardii  250 mg Oral BID  . sodium chloride flush  3 mL Intravenous Q12H    Social History  Substance Use Topics  . Smoking status: Former Smoker -- 40 years    Types: Pipe  Quit date: 07/12/1993  . Smokeless tobacco: Never Used  . Alcohol Use: 0.0 oz/week    0 Standard drinks or equivalent per week     Comment: occasionally.    Family History  Problem Relation Age of Onset  . Other Mother   . Other Father   . Heart disease Father   . Hyperlipidemia Son      Review of Systems  Constitutional: Negative for fever, chills, diaphoresis, activity change, appetite change, fatigue and unexpected weight change.  HENT: Negative for congestion, sore throat, rhinorrhea, sneezing, trouble swallowing and sinus pressure.  Eyes: Negative for photophobia and visual disturbance.  Respiratory: Negative  for cough, chest tightness, shortness of breath, wheezing and stridor.  Cardiovascular: Negative for chest pain, palpitations and leg swelling.  Gastrointestinal: Negative for nausea, vomiting, abdominal pain, diarrhea, constipation, blood in stool, abdominal distention and anal bleeding.  Genitourinary: Negative for dysuria, hematuria, flank pain and difficulty urinating.  Musculoskeletal: Negative for myalgias, back pain, joint swelling, arthralgias and gait problem.  Skin: + rash, swelling, erythema Negative for color change, pallor, rash and wound.  Neurological: Negative for dizziness, tremors, weakness and light-headedness.  Hematological: Negative for adenopathy. Does not bruise/bleed easily.  Psychiatric/Behavioral: Negative for behavioral problems, confusion, sleep disturbance, dysphoric mood, decreased concentration and agitation.     OBJECTIVE: Temp:  [97.3 F (36.3 C)-99.6 F (37.6 C)] 98.3 F (36.8 C) (02/08 1303) Pulse Rate:  [65-83] 70 (02/08 1303) Resp:  [10-21] 13 (02/08 1232) BP: (121-154)/(70-92) 147/92 mmHg (02/08 1303) SpO2:  [98 %-100 %] 100 % (02/08 1303) Physical Exam  Constitutional: He is oriented to person, place, and time. He appears well-developed and well-nourished. No distress.  HENT:  Mouth/Throat: Oropharynx is clear and moist. No oropharyngeal exudate.  Cardiovascular: Normal rate, regular rhythm and normal heart sounds. Exam reveals no gallop and no friction rub.  No murmur heard.  Pulmonary/Chest: Effort normal and breath sounds normal. No respiratory distress. He has no wheezes.  Abdominal: Soft. Bowel sounds are normal. He exhibits no distension. There is no tenderness.  Lymphadenopathy:  He has no cervical adenopathy.  Neurological: He is alert and oriented to person, place, and time. Ext: trace edema to right calf and ankle  Skin: Skin is warm and dry. Hyperpigmentation due venous stasis to lower legs. He has cyanotic appearing 2-3-4th toes on  left and 2nd toe on the right.wound vac in place lateral calf. Psychiatric: He has a normal mood and affect. His behavior is normal.     LABS: Lab Results  Component Value Date   WBC 6.6 05/13/2015   HGB 9.3* 05/13/2015   HCT 28.5* 05/13/2015   MCV 105.2* 05/13/2015   PLT 127* 05/13/2015   Lab Results  Component Value Date   CREATININE 1.07 05/15/2015     MICRO: 2/4 OR cx enterococcus amp S IMAGING: 2/1 tibia ct showed evidence of complex fluid collection concerning for abscess per my read FINDINGS: There is an area of skin irregularity posterolaterally in the distal third of the right lower leg, presumably at the site of recent surgical debridement. Within the underlying subcutaneous fat, there is a complex ill-defined collection of fluid and gas. This measures up to 3.4 x 1.5 cm on axial image 135 and extends approximately 3.9 cm cephalocaudad. No foreign bodies are apparent in this area. The inflammatory changes appear confined to the subcutaneous fat. The underlying muscles demonstrate normal enhancement and no focal fluid collection.  There is generalized subcutaneous edema throughout the distal lower leg.  No other focal fluid collections are seen. There are some muscular venous collateral vessels. Mild popliteal arterial atherosclerosis noted.  There is no evidence of acute fracture, dislocation or bone destruction. Relatively mild degenerative changes are present at the knee and ankle. No significant joint effusions seen.  IMPRESSION: 1. Superficial collection of gas and fluid in the subcutaneous fat of the distal lower leg posterolaterally, suspicious for residual abscess status post recent debridement. No involvement of the underlying musculature identified. 2. No evidence of osteomyelitis or acute osseous findings.  Assessment/Plan:  82yo M with MDS who has deep tissue infection right lower leg with cultures showing + enterococcus.  - plan to do 2 wk  of ampicillin IV via picc line - will place order for picc line - will repeat cbc with diff to see how he has responded to abtx   - MDS = appears stable  - cyanotic toes = at his baseline  - chills = likely due to underlying infection, that appears to have resolved  I have spent 60 min with patient with more than 50% in counseling, discussion of treatment planand reviewing records   .... Home health orders.............Marland Kitchen  Diagnosis: Cellulitis and deep abscess  Culture Result: enterococcus  Allergies  Allergen Reactions  . Nsaids Other (See Comments)    Nose bleeds from high doses of ibuprofen  . Azithromycin Nausea And Vomiting  . Other Other (See Comments)    Ragweed causes nasal congestion    Discharge antibiotics: Per pharmacy protocol  Ampicillin continues infusion  Duration:  14 days End Date: Feb 24th  [place] Per Protocol:  Labs weekly while on IV antibiotics: _x_ CBC with differential _x_ CMP   Fax weekly labs to [phone]  Clinic Follow Up Appt: In 2 wk with Dr Baxter Flattery  @

## 2015-05-17 NOTE — Anesthesia Preprocedure Evaluation (Signed)
Anesthesia Evaluation  Patient identified by MRN, date of birth, ID band Patient awake    Reviewed: Allergy & Precautions, NPO status , Patient's Chart, lab work & pertinent test results  Airway Mallampati: II  TM Distance: >3 FB Neck ROM: Full    Dental   Pulmonary former smoker,    breath sounds clear to auscultation       Cardiovascular hypertension, + Peripheral Vascular Disease   Rhythm:Regular Rate:Normal  History noted. CE   Neuro/Psych    GI/Hepatic Neg liver ROS, GERD  ,  Endo/Other    Renal/GU negative Renal ROS     Musculoskeletal  (+) Arthritis ,   Abdominal   Peds  Hematology  (+) anemia ,   Anesthesia Other Findings   Reproductive/Obstetrics                             Anesthesia Physical Anesthesia Plan  ASA: III  Anesthesia Plan: General   Post-op Pain Management:    Induction: Intravenous  Airway Management Planned: LMA  Additional Equipment:   Intra-op Plan:   Post-operative Plan: Extubation in OR  Informed Consent: I have reviewed the patients History and Physical, chart, labs and discussed the procedure including the risks, benefits and alternatives for the proposed anesthesia with the patient or authorized representative who has indicated his/her understanding and acceptance.   Dental advisory given  Plan Discussed with: CRNA and Anesthesiologist  Anesthesia Plan Comments:         Anesthesia Quick Evaluation

## 2015-05-18 ENCOUNTER — Encounter (HOSPITAL_COMMUNITY): Payer: Self-pay | Admitting: Orthopedic Surgery

## 2015-05-18 LAB — BASIC METABOLIC PANEL
ANION GAP: 10 (ref 5–15)
BUN: 20 mg/dL (ref 6–20)
CALCIUM: 8.8 mg/dL — AB (ref 8.9–10.3)
CO2: 26 mmol/L (ref 22–32)
CREATININE: 1.21 mg/dL (ref 0.61–1.24)
Chloride: 103 mmol/L (ref 101–111)
GFR calc non Af Amer: 54 mL/min — ABNORMAL LOW (ref >60)
Glucose, Bld: 100 mg/dL — ABNORMAL HIGH (ref 65–99)
Potassium: 4.1 mmol/L (ref 3.5–5.1)
SODIUM: 139 mmol/L (ref 135–145)

## 2015-05-18 LAB — CBC WITH DIFFERENTIAL/PLATELET
BASOS ABS: 0 10*3/uL (ref 0.0–0.1)
Basophils Relative: 0 %
EOS ABS: 0 10*3/uL (ref 0.0–0.7)
Eosinophils Relative: 1 %
HEMATOCRIT: 28.1 % — AB (ref 39.0–52.0)
HEMOGLOBIN: 9.2 g/dL — AB (ref 13.0–17.0)
LYMPHS ABS: 1.5 10*3/uL (ref 0.7–4.0)
LYMPHS PCT: 42 %
MCH: 33.9 pg (ref 26.0–34.0)
MCHC: 32.7 g/dL (ref 30.0–36.0)
MCV: 103.7 fL — ABNORMAL HIGH (ref 78.0–100.0)
MONOS PCT: 39 %
Monocytes Absolute: 1.3 10*3/uL — ABNORMAL HIGH (ref 0.1–1.0)
NEUTROS ABS: 0.6 10*3/uL — AB (ref 1.7–7.7)
Neutrophils Relative %: 18 %
Platelets: 182 10*3/uL (ref 150–400)
RBC: 2.71 MIL/uL — AB (ref 4.22–5.81)
RDW: 15.8 % — AB (ref 11.5–15.5)
WBC: 3.4 10*3/uL — AB (ref 4.0–10.5)

## 2015-05-18 LAB — SEDIMENTATION RATE: SED RATE: 107 mm/h — AB (ref 0–16)

## 2015-05-18 LAB — ANAEROBIC CULTURE

## 2015-05-18 LAB — C-REACTIVE PROTEIN: CRP: 2.5 mg/dL — AB (ref <1.0)

## 2015-05-18 NOTE — Progress Notes (Signed)
Patient ID: Joel Macdonald, male   DOB: [birth year], 82 y.o.   MRN: [id number] Postoperative day 1 wound VAC functioning well. Of note patient had approximately 2 cm of necrotic tissue around the wound bed. The wound bed had good granulation tissue. Discussed with the patient these findings. Have consulted infectious disease for long-term antibiotics. Possible discharge to home tomorrow if patient is ready from an antibiotic standpoint for discharge.

## 2015-05-18 NOTE — Care Management Note (Signed)
Case Management Note  Patient Details  Name: Joel Macdonald MRN: [id number] Date of Birth: [birth year]  Subjective/Objective:                 Admitted with L cellulitis. From home with wife. Independent with ADL's.    -S/P Excisional debridement right leg with necrotic wound / wound vac  05/13/15  S/p PROCEDURE: Excision skin soft tissue and muscle,  SKIN GRAFT both a cell and TheraSkin , Prevena VAC placement   05/17/15  Action/Plan: Return to home when medically stable. CM to f/u with disposition needs.  Expected Discharge Date:                  Expected Discharge Plan:  Home/Self Care  In-House Referral:     Discharge planning Services  CM Consult  Post Acute Care Choice:    Choice offered to:  Patient  DME Arranged:  IV pump/equipment DME Agency:  [place]:  RN [facility] Agency:  [place]  Status of Service:  Completed, signed off  Medicare Important Message Given:   Date Medicare IM Given:    Medicare IM give by:    Date Additional Medicare IM Given:    Additional Medicare Important Message give by:     If discussed at [place] of Stay Meetings, dates discussed:    Additional Comments:  05/19/15 @ 10:00 am  d/c to home with home health services([facility]), continuous IV antibiotics infusion. Pt is to f/u with Dr.Duda regarding prevena vac.  DILAN NETRO (Spouse)  [phone]  [place], [place] [phone] 05/18/2015, 8:31 PM

## 2015-05-18 NOTE — Progress Notes (Addendum)
    [facility] for Infectious Disease    Date of Admission:  05/12/2015   Total days of antibiotics 7        Day 3 ampicillin           ID: Joel Macdonald is a 82 y.o. male with  MDS with enterococcal abscess/cellulitis to left leg s/p debridement and wound vac Active Problems:   Cellulitis of leg, left   Cellulitis and abscess of leg   Peripheral vascular disease, unspecified (HCC)   MDS (myelodysplastic syndrome), high grade (HCC)   Abscess    Subjective: Feeling better, feels possibly less swelling to left leg. Anxious to get home tomorrow  Medications:  . ampicillin (OMNIPEN) IV  2 g Intravenous 4 times per day  . clobetasol ointment  1 application Topical BID  . enoxaparin (LOVENOX) injection  40 mg Subcutaneous Q24H  . levothyroxine  50 mcg Oral QAC breakfast  . pantoprazole  40 mg Oral Daily  . saccharomyces boulardii  250 mg Oral BID  . sodium chloride flush  3 mL Intravenous Q12H    O: BP 134/87 mmHg  Pulse 87  Temp(Src) 98.1 F (36.7 C) (Oral)  Resp 18  Ht 6' (1.829 m)  Wt 195 lb 5.2 oz (88.6 kg)  BMI 26.49 kg/m2  SpO2 100% Physical Exam  Constitutional: He is oriented to person, place, and time. He appears well-developed and well-nourished. No distress.  HENT:  Mouth/Throat: Oropharynx is clear and moist. No oropharyngeal exudate.  Cardiovascular: Normal rate, regular rhythm and normal heart sounds. Exam reveals no gallop and no friction rub.  No murmur heard.  Pulmonary/Chest: Effort normal and breath sounds normal. No respiratory distress. He has no wheezes.  Abdominal: Soft. Bowel sounds are normal. He exhibits no distension. There is no tenderness.  Lymphadenopathy:  He has no cervical adenopathy.  Neurological: He is alert and oriented to person, place, and time.  Skin: Skin is warm and dry. No rash noted. No erythema.  Psychiatric: He has a normal mood and affect. His behavior is normal. Ext: wound vac-  In place. Trace ankle swelling  Lab :  no recent labs  Sedimentation Rate No results for input(s): ESRSEDRATE in the last 72 hours. C-Reactive Protein No results for input(s): CRP in the last 72 hours.  Microbiology: 2/4 wound cx enterococcus Amp S  Assessment/Plan: Enterococcus abscess/ cellulitis of left leg = will get picc line today with anticipation of 2 wk of ampicillin continuous infusion. i have spoke to patient with home health coordinator to benefits of doing continuous infusion. Will get baseline cbc, bmp, sed rate adn crp today  Spent 34m in with patient with greater than 50% in coordination of care and counseling  Baxter Flattery [facility] Cell: [phone] Pager: [phone]  05/18/2015, 4:44 PM

## 2015-05-18 NOTE — Care Management Important Message (Signed)
Important Message  Patient Details  Name: Joel Macdonald MRN: [id number] Date of Birth: Feb 09, 1931   Medicare Important Message Given:  Yes    Loann Quill 05/18/2015, 8:36 AM

## 2015-05-18 NOTE — Progress Notes (Signed)
PT Cancellation and Discharge Note  Patient Details Name: Joel Macdonald MRN: [id number] DOB: Sep 07, 1930   Cancelled Treatment:    Reason Eval/Treat Not Completed: Other (comment) Spoke with PTA. Patient has been doing very well with therapy. Adequately completed stair training and is now ambulating without assistance. Education complete. Pt adequate for d/c from PT standpoint when medically ready. No further acute PT needs identified. Will sign-off at this time. Please call for any questions. Thanks!  Ellouise Newer 05/18/2015, 3:59 PM  [place], [place]

## 2015-05-18 NOTE — Progress Notes (Signed)
PROGRESS NOTE  Joel Macdonald [id number] DOB: 12/04/1930 DOA: 05/12/2015 PCP: Mathews Argyle, MD  HPI: Joel Macdonald is an 82 y.o. male with history of prostate cancer s/p radical prostatectomy, myelodysplastic syndrome, recurrent cellulitis, GERD, and hypothyroidism, who presented from his PCPs office for nonhealing and worsening RLE abscess.  Subjective / 24 H Interval events - no complaints this morning, surgery went well  Assessment/Plan: Active Problems:   Cellulitis of leg, left   Cellulitis and abscess of leg   Peripheral vascular disease, unspecified (HCC)   MDS (myelodysplastic syndrome), high grade (HCC)   Abscess   Right Leg Cellulitis with Abscess, failing outpatient therapy - Started initially empirically on vancomycin and Zosyn - Orthopedic surgery performed I&D and placed wound VAC 05/13/2015 followed by wound debridement and skin graft placement 05/17/2015 - Wound culture grew enterococcus species, narrowed to ampicillin - discussed with ID, will need Ampicillin for 2 weeks - home with wound vac, portable, will follow up with Dr. Sharol Given next week  History of myelodysplastic syndrome with pancytopenia - Hgb stable, outpatient follow up with Dr. Beryle Beams   History of Prostate CA s/p prostatectomy - Follows with Urologist as outpatient  Hypothyroidism - Continue Synthroid  GERD: -Continue PPI   Diet: Resume carb modified diet as tolerated post-operatively  Fluids: LRs at present DVT Prophylaxis: Resume lovenox when ok with surgery  Code Status: Full Code Family Communication: None at bedside Disposition Plan: likely home  tomorrow Barriers to discharge: Post-op recovery, arrangement of antibiotic regimen  Consultants:  Orthopedics, ID  Procedures:  Right leg abscess I&D 05/13/2015  Right leg abscess debridement and skin graft placement 05/17/2015   Antibiotics Ampicillin  05/16/2015 >> Zosyn   05/12/2015 >> 05/16/2015 Vancomycin  05/12/2015 >>  05/16/2015    Studies  No results found.  Objective  Filed Vitals:   05/17/15 1232 05/17/15 1235 05/17/15 1303 05/17/15 2113  BP: 147/79  147/92 139/71  Pulse: 68  70 72  Temp:  97.3 F (36.3 C) 98.3 F (36.8 C) 98.7 F (37.1 C)  TempSrc:   Oral Oral  Resp: 13   18  Height:      Weight:      SpO2: 100%  100% 98%    Intake/Output Summary (Last 24 hours) at 05/18/15 1321 Last data filed at 05/18/15 0600  Gross per 24 hour  Intake   1045 ml  Output   1930 ml  Net   -885 ml   Filed Weights   05/12/15 1217  Weight: 88.6 kg (195 lb 5.2 oz)    Exam:  GENERAL: NAD  HEENT: no scleral icterus, PERRL  NECK: supple, no LAD  LUNGS: CTA biL, no wheezing  EXTREMITIES: no clubbing / cyanosis  NEUROLOGIC: non focal  PSYCHIATRIC: normal mood and affect  SKIN: RLE with broad erythema and hyperpigmentation, Wound Vac in place over lateral abscess site; LLE with hyperpigmentation and small <1cm lesion without drainage on medial calf. Well healed surgical scar over left scapula.  Data Reviewed: Basic Metabolic Panel:  Recent Labs Lab 05/12/15 1357 05/13/15 0630 05/15/15 0405  NA 140 137 137  K 3.8 3.6 3.8  CL 106 104 102  CO2 25 23 24   GLUCOSE 107* 97 92  BUN 18 10 13   CREATININE 0.99 1.06 1.07  CALCIUM 8.6* 8.8* 9.2   Liver Function Tests:  Recent Labs Lab 05/12/15 1357  AST 16  ALT 11*  ALKPHOS 68  BILITOT 0.5  PROT 6.8  ALBUMIN 3.6  CBC:  Recent Labs Lab 05/12/15 1357 05/13/15 0630  WBC 5.8 6.6  NEUTROABS 2.0  --   HGB 8.9* 9.3*  HCT 27.6* 28.5*  MCV 104.2* 105.2*  PLT 134* 127*    Recent Results (from the past 240 hour(s))  Surgical pcr screen     Status: None   Collection Time: 05/12/15 11:09 PM  Result Value Ref Range Status   MRSA, PCR NEGATIVE NEGATIVE Final   Staphylococcus aureus NEGATIVE NEGATIVE Final    Comment:        The Xpert SA Assay (FDA approved for NASAL specimens in patients over 38 years of age), is one  component of a comprehensive surveillance program.  Test performance has been validated by [facility] for patients greater than or equal to 26 year old. It is not intended to diagnose infection nor to guide or monitor treatment.   Anaerobic culture     Status: None (Preliminary result)   Collection Time: 05/13/15  8:18 AM  Result Value Ref Range Status   Specimen Description ABSCESS RIGHT LEG  Final   Special Requests SPEC A  Final   Gram Stain   Final    ABUNDANT WBC PRESENT, PREDOMINANTLY PMN NO SQUAMOUS EPITHELIAL CELLS SEEN MODERATE GRAM POSITIVE COCCI IN PAIRS IN CLUSTERS IN CHAINS FEW GRAM NEGATIVE RODS Performed at [facility]    Culture   Final    NO ANAEROBES ISOLATED; CULTURE IN PROGRESS FOR 5 DAYS Performed at [facility]    Report Status PENDING  Incomplete  Wound culture     Status: None   Collection Time: 05/13/15  8:18 AM  Result Value Ref Range Status   Specimen Description ABSCESS RIGHT LEG  Final   Special Requests SPEC A  Final   Gram Stain   Final    MODERATE WBC PRESENT, PREDOMINANTLY PMN NO SQUAMOUS EPITHELIAL CELLS SEEN FEW GRAM POSITIVE COCCI IN PAIRS FEW GRAM NEGATIVE RODS Performed at [facility]    Culture   Final    ABUNDANT ENTEROCOCCUS SPECIES Performed at [facility]    Report Status 05/16/2015 FINAL  Final   Organism ID, Bacteria ENTEROCOCCUS SPECIES  Final      Susceptibility   Enterococcus species - MIC*    VANCOMYCIN 2 SENSITIVE Sensitive     AMPICILLIN <=2 SENSITIVE Sensitive     * ABUNDANT ENTEROCOCCUS SPECIES     Scheduled Meds: . ampicillin (OMNIPEN) IV  2 g Intravenous 4 times per day  . clobetasol ointment  1 application Topical BID  . enoxaparin (LOVENOX) injection  40 mg Subcutaneous Q24H  . levothyroxine  50 mcg Oral QAC breakfast  . pantoprazole  40 mg Oral Daily  . saccharomyces boulardii  250 mg Oral BID  . sodium chloride flush  3 mL Intravenous Q12H   Continuous  Infusions: . sodium chloride 10 mL/hr at 05/13/15 1214  . sodium chloride 10 mL/hr at 05/17/15 1313  . lactated ringers Stopped (05/17/15 1314)    Marzetta Board, MD [facility] Pager [phone]. If 7 PM - 7 AM, please contact night-coverage at [email or web address], password [facility] 05/18/2015, 1:21 PM  LOS: 6 days

## 2015-05-18 NOTE — Progress Notes (Signed)
   05/18/15 1019  PT Visit Information  Reason Eval/Treat Not Completed Other (comment) (Pt ambulating in room unassisted and managing lines and leads without assist.  Will inform supervising PT to sign off.  )

## 2015-05-18 NOTE — Progress Notes (Signed)
[facility]  Patient Status:  New pt for [facility] this admission  [facility] is providing the following services: HHRN and Home Infusion Pharmacy team for home IV ABX.  In hospital teaching with pt regarding the CADD pump for continuous infusion at home for ease of administration for pt with a 24 hour bag of Ampicillin to be change Q 24 hours. [facility] will follow until DC to support transition home.   If patient discharges after hours, please call [phone].   Larry Sierras 05/18/2015, 9:42 PM

## 2015-05-19 DIAGNOSIS — B952 Enterococcus as the cause of diseases classified elsewhere: Secondary | ICD-10-CM

## 2015-05-19 LAB — CREATININE, SERUM
Creatinine, Ser: 1.06 mg/dL (ref 0.61–1.24)
GFR calc Af Amer: >60 mL/min (ref >60)
GFR calc non Af Amer: >60 mL/min (ref >60)

## 2015-05-19 MED ORDER — SODIUM CHLORIDE 0.9% FLUSH: 10.0000 mL | INTRAVENOUS | Status: DC | PRN

## 2015-05-19 MED ORDER — SODIUM CHLORIDE 0.9 % IV SOLN: 2.0000 g | Freq: Four times a day (QID) | INTRAVENOUS | Status: DC

## 2015-05-19 NOTE — Progress Notes (Signed)
Patient spoke with [facility] regarding IV antibiotics at discharge and was told they will be there at 3:00pm to start his continuous antibiotic infusion. He does not want to wait for 12pm dose to be administered before discharge and wants to leave now. Case Management notified so that [facility] will be notified of this dose not being given.  Patient discharge teaching given, including activity, diet, follow-up appoints, and medications. Patient verbalized understanding of all discharge instructions. IV access was d/c'd. Vitals are stable. Skin is intact except as charted in most recent assessments. Pt to be escorted out by NT, to be driven home by family.

## 2015-05-19 NOTE — Progress Notes (Signed)
Pt slept throughout night, abx given as ordered, pt stable

## 2015-05-19 NOTE — Progress Notes (Signed)
Patient ID: Joel Macdonald, male   DOB: 04-14-1930, 82 y.o.   MRN: [id number] VAC changed over to [place], good suction, I'll follow up in office next week

## 2015-05-19 NOTE — Progress Notes (Signed)
Peripherally Inserted Central Catheter/Midline Placement  The IV Nurse has discussed with the patient and/or persons authorized to consent for the patient, the purpose of this procedure and the potential benefits and risks involved with this procedure.  The benefits include less needle sticks, lab draws from the catheter and patient may be discharged home with the catheter.  Risks include, but not limited to, infection, bleeding, blood clot (thrombus formation), and puncture of an artery; nerve damage and irregular heat beat.  Alternatives to this procedure were also discussed.  PICC/Midline Placement Documentation        Joel Macdonald 05/19/2015, 9:34 AM

## 2015-05-19 NOTE — Discharge Summary (Signed)
Physician Discharge Summary  Joel Macdonald [id number] DOB: 1931-03-13 DOA: 05/12/2015  PCP: Mathews Argyle, MD  Admit date: 05/12/2015 Discharge date: 05/19/2015  Time spent: > 30 minutes  Recommendations for Outpatient Follow-up:  1. Follow up with Dr. Sharol Given in 1 week 2. Follow up with Dr. Baxter Flattery in 2 weeks 3. Follow up with Dr. Felipa Eth as scheduled  4. Follow-up with Dr. Beryle Beams as scheduled  Discharge Diagnoses:  Active Problems:   Cellulitis of leg, left   Cellulitis and abscess of leg   Peripheral vascular disease, unspecified (HCC)   MDS (myelodysplastic syndrome), high grade (HCC)   Abscess   Enterococcus faecalis infection  Discharge Condition: stable  Diet recommendation: regular  Filed Weights   05/12/15 1217  Weight: 88.6 kg (195 lb 5.2 oz)    History of present illness:  Per Dr. Ree Kida, Joel Macdonald is a 82 y.o. male With a history of myelodysplastic syndrome, GERD, prostate cancer, presented to the hospital as a direct admission from Dr. Janalyn Rouse office (wound care) for nonhealing and worsening abscess. It seems the patient has had this right lower extremity abscess/cellulitis and wound since November of this past year, although in August 2016, wound culture showed MSSA. Patient was initially placed on doxycycline however then transitioned to Levaquin most recently Keflex and clindamycin. Unfortunately, patient does have chronic venous insufficiency with poor wound healing. Patient was sent for admission for IV antibiotics and surgical intervention. Patient did have CT scan on 05/10/2015 which did show a superficial abscess.  Hospital Course:  Right leg cellulitis with abscess - despite therapy with antibiotics as an outpatient, his wound has progressively gotten worse and was sent here for IV antibiotics and orthopedic surgery consult. Dr. Sharol Given evaluated patient and took him to the operating room twice, on 05/13/2015 for I&D followed by wound VAC  placement, and patient had a repeat intervention on 05/17/2015 for skin graft placement. Wound culture from first intervention grew enterococcus species sensitive to ampicillin, and his antibiotics were narrowed to that. Given depth of infection and need for outpatient follow-up, infectious disease was consulted and have followed patient hospitalized. They recommended ampicillin for 2 additional weeks weeks via IV route. A PICC line was placed, and patient will be sent home with continuous infusion of ampicillin as it would be difficult to administer every 6 hours. He will go home with a wound VAC and follow up with Dr. Sharol Given in one week as an outpatient. History of myelodysplastic syndrome with pancytopenia - is followed by Dr. Beryle Beams, outpatient follow-up History of Prostate CA s/p prostatectomy - Follows with Urology as outpatient Hypothyroidism - Continue Synthroid GERD -Continue PPI  Procedures:  I&D 2/4, skin graft 2/8   Consultations:  Orthopedic surgery   ID  Discharge Exam: Filed Vitals:   05/17/15 2113 05/18/15 1457 05/18/15 2113 05/19/15 0539  BP: 139/71 134/87 124/54 142/72  Pulse: 72 87 72 66  Temp: 98.7 F (37.1 C) 98.1 F (36.7 C) 98 F (36.7 C) 97.7 F (36.5 C)  TempSrc: Oral Oral Oral Oral  Resp: 18 18 20 20   Height:      Weight:      SpO2: 98% 100% 98% 100%   General: NAD Cardiovascular: RRR without MRG Respiratory: CTA biL Ext: RLE with wound vac in place  Discharge Instructions Activity:  As tolerated   Get Medicines reviewed and adjusted: Please take all your medications with you for your next visit with your Primary MD  Please request your Primary MD  to go over all hospital tests and procedure/radiological results at the follow up, please ask your Primary MD to get all Hospital records sent to his/her office.  If you experience worsening of your admission symptoms, develop shortness of breath, life threatening emergency, suicidal or homicidal  thoughts you must seek medical attention immediately by calling 911 or calling your MD immediately if symptoms less severe.  You must read complete instructions/literature along with all the possible adverse reactions/side effects for all the Medicines you take and that have been prescribed to you. Take any new Medicines after you have completely understood and accpet all the possible adverse reactions/side effects.   Do not drive when taking Pain medications.   Do not take more than prescribed Pain, Sleep and Anxiety Medications  Special Instructions: If you have smoked or chewed Tobacco in the last 2 yrs please stop smoking, stop any regular Alcohol and or any Recreational drug use.  Wear Seat belts while driving.  Please note  You were cared for by a hospitalist during your hospital stay. Once you are discharged, your primary care physician will handle any further medical issues. Please note that NO REFILLS for any discharge medications will be authorized once you are discharged, as it is imperative that you return to your primary care physician (or establish a relationship with a primary care physician if you do not have one) for your aftercare needs so that they can reassess your need for medications and monitor your lab values.    Medication List    STOP taking these medications        cephALEXin 500 MG capsule  Commonly known as:  KEFLEX     clindamycin 300 MG capsule  Commonly known as:  CLEOCIN      TAKE these medications        acetaminophen 500 MG tablet  Commonly known as:  TYLENOL  Take 500 mg by mouth every 4 (four) hours as needed (pain).     ALKA-SELTZER EXTRA STRENGTH 500 MG Tbef  Generic drug:  Aspirin Effervescent  Take 1,000 mg by mouth every 4 (four) hours as needed (pain/ indigestion/ heartburn).     ampicillin 2 g in sodium chloride 0.9 % 50 mL  Inject 2 g into the vein every 6 (six) hours.     clobetasol ointment 0.05 %  Commonly known as:   TEMOVATE  Apply 1 application topically 2 (two) times daily as needed (psoriasis).     fluticasone 50 MCG/ACT nasal spray  Commonly known as:  FLONASE  Place 1 spray into both nostrils daily as needed (seasonal allergies).     ibuprofen 200 MG tablet  Commonly known as:  ADVIL,MOTRIN  Take 400 mg by mouth every 4 (four) hours as needed for mild pain (pain).     levothyroxine 50 MCG tablet  Commonly known as:  SYNTHROID, LEVOTHROID  Take 50 mcg by mouth daily before breakfast.     ranitidine 150 MG tablet  Commonly known as:  ZANTAC  Take 150 mg by mouth daily as needed for heartburn.     triamcinolone cream 0.1 %  Commonly known as:  KENALOG  Apply 1 application topically daily as needed (wound care/ dry skin).           Follow-up Information    Follow up with Newt Minion, MD On 05/25/2015.   Specialty:  Orthopedic Surgery   Why:  APPT on 05/25/15 @ 8:15am. PLEASE BRING A LIST OF CURRENT MEDICATIONS THAT YOU ARE  TAKING ON A DAILY BASIS   Contact information:   [place] [address] [phone]       Follow up with [facility].   Why:  Home Health RN arranged   Contact information:   [address] [place] [address] [phone]       Follow up with [facility].   Why:  Home Health IV infusion arranged   Contact information:   [address] [place] [address] [phone]       Follow up with Carlyle Basques, MD. Schedule an appointment as soon as possible for a visit in 2 weeks.   Specialty:  Infectious Diseases   Why:  NS left voicemail for KIM @ 05/19/15 @ 11:36 to call patient back on home# [phone] or cell# [phone] for APPT. NS alert RN on 05/19/15 @ 11:38 RN acknowledge barrier.   [address] [place] [address] [phone]      The results of significant diagnostics from this hospitalization (including imaging,  microbiology, ancillary and laboratory) are listed below for reference.    Significant Diagnostic Studies: Ct Tibia Fibula Right W Contrast  05/10/2015  CLINICAL DATA:  Evaluate for recurrent right lower leg abscess. History of bilateral leg abscesses with debridement 2 weeks ago. History of myelodysplastic syndrome and prostate cancer. EXAM: CT OF THE LOWER RIGHT EXTREMITY WITH CONTRAST TECHNIQUE: Multidetector CT imaging of the right lower leg was performed according to the standard protocol following intravenous contrast administration. COMPARISON:  None. CONTRAST:  12mL ISOVUE-300 IOPAMIDOL (ISOVUE-300) INJECTION 61% FINDINGS: There is an area of skin irregularity posterolaterally in the distal third of the right lower leg, presumably at the site of recent surgical debridement. Within the underlying subcutaneous fat, there is a complex ill-defined collection of fluid and gas. This measures up to 3.4 x 1.5 cm on axial image 135 and extends approximately 3.9 cm cephalocaudad. No foreign bodies are apparent in this area. The inflammatory changes appear confined to the subcutaneous fat. The underlying muscles demonstrate normal enhancement and no focal fluid collection. There is generalized subcutaneous edema throughout the distal lower leg. No other focal fluid collections are seen. There are some muscular venous collateral vessels. Mild popliteal arterial atherosclerosis noted. There is no evidence of acute fracture, dislocation or bone destruction. Relatively mild degenerative changes are present at the knee and ankle. No significant joint effusions seen. IMPRESSION: 1. Superficial collection of gas and fluid in the subcutaneous fat of the distal lower leg posterolaterally, suspicious for residual abscess status post recent debridement. No involvement of the underlying musculature identified. 2. No evidence of osteomyelitis or acute osseous findings. 3. These results will be called to the ordering clinician  or representative by the Radiologist Assistant, and communication documented in the PACS or zVision Dashboard. Electronically Signed   By: Richardean Sale M.D.   On: 05/10/2015 16:20   Dg Bone Density  04/25/2015  EXAM: DUAL X-RAY ABSORPTIOMETRY (DXA) FOR BONE MINERAL DENSITY IMPRESSION: Referring Physician:  Jovita Gamma PATIENT: Name: STEPEHEN, BUSSIE Patient ID: [id number] Birth Date: [birth year] Height: 72.5 in. Sex: Male Measured: 04/25/2015 Weight: 226.0 lbs. Indications: Advanced Age, Caucasian, Height Loss (781.91), History of Fracture (Adult) (V15.51), Low Calcium Intake (269.3), myelodysplastic syndrome Fractures: Spine Treatments: ASSESSMENT: The BMD measured at Forearm Radius 33% is 0.891 g/cm2 with a T-score of -1.1 is considered osteopenic. Lumbar spine was not utilized due to advanced degenerative changes.  There has been no statistically significant change in BMD of Left hip since prior exam dated 11/10/2009. Site Region Measured Date Measured Age YA BMD Significant CHANGE T-score Left Forearm Radius 33% 04/25/2015 82.7 -1.1 0.891 g/cm2 DualFemur Neck Left 04/25/2015 82.7 -0.1 1.030 g/cm2 World Health Organization [facility]) criteria for post-menopausal, Caucasian Women: Normal       T-score at or above -1 SD Osteopenia   T-score between -1 and -2.5 SD Osteoporosis T-score at or below -2.5 SD RECOMMENDATION: [facility] recommends that FDA-approved medical therapies be considered in postmenopausal women and men age 3 or older with a: 1. Hip or vertebral (clinical or morphometric) fracture. 2. T-score of <-2.5 at the spine or hip. 3. Ten-year fracture probability by FRAX of 3% or greater for hip fracture or 20% or greater for major osteoporotic fracture. All treatment decisions require clinical judgment and consideration of individual patient factors, including patient preferences, co-morbidities, previous drug use, risk factors not captured in the FRAX model (e.g. falls, vitamin D  deficiency, increased bone turnover, interval significant decline in bone density) and possible under - or over-estimation of fracture risk by FRAX. All patients should ensure an adequate intake of dietary calcium (1200 mg/d) and vitamin D (800 IU daily) unless contraindicated. FOLLOW-UP: People with diagnosed cases of osteoporosis or osteopenia should be regularly tested for bone mineral density. For patients eligible for Medicare, routine testing is allowed once every 2 years. The testing frequency can be increased to one year for patients who have rapidly progressing disease, or for those who are receiving medical therapy to restore bone mass. I have reviewed this report, and agree with the above findings. [facility] FRAX* 10-year Probability of Fracture Based on femoral neck BMD: DualFemur (Left) Major Osteoporotic Fracture: 7.8% Hip Fracture:                2.0% Population:                  [place] (Caucasian) Risk Factors:                History of Fracture (Adult) (V15.51) *FRAX is a Materials engineer of the [employer] of [employer] for Metabolic Bone Disease, a World Pharmacologist (WHO) [facility]. ASSESSMENT: The probability of a major osteoporotic fracture is 7.8 % within the next ten years. The probability of a hip fracture is 2.0 % within the next ten years. Electronically Signed   By: Claudie Revering M.D.   On: 04/25/2015 10:50    Microbiology: Recent Results (from the past 240 hour(s))  Surgical pcr screen     Status: None   Collection Time: 05/12/15 11:09 PM  Result Value Ref Range Status   MRSA, PCR NEGATIVE NEGATIVE Final   Staphylococcus aureus NEGATIVE NEGATIVE Final    Comment:        The Xpert SA Assay (FDA approved for NASAL specimens in patients over 46 years of age), is one component of a comprehensive surveillance program.  Test performance has been validated by [facility] for patients greater than or equal to 73 year old. It is not  intended to diagnose infection nor to guide or monitor treatment.   Anaerobic culture     Status: None   Collection Time: 05/13/15  8:18 AM  Result Value Ref Range Status   Specimen Description ABSCESS RIGHT LEG  Final   Special Requests SPEC A  Final   Gram Stain   Final    ABUNDANT WBC PRESENT,  PREDOMINANTLY PMN NO SQUAMOUS EPITHELIAL CELLS SEEN MODERATE GRAM POSITIVE COCCI IN PAIRS IN CLUSTERS IN CHAINS FEW GRAM NEGATIVE RODS Performed at [facility]    Culture   Final    BACTEROIDES CACCAE Note: BETA LACTAMASE POSITIVE Performed at [facility]    Report Status 05/18/2015 FINAL  Final  Wound culture     Status: None   Collection Time: 05/13/15  8:18 AM  Result Value Ref Range Status   Specimen Description ABSCESS RIGHT LEG  Final   Special Requests SPEC A  Final   Gram Stain   Final    MODERATE WBC PRESENT, PREDOMINANTLY PMN NO SQUAMOUS EPITHELIAL CELLS SEEN FEW GRAM POSITIVE COCCI IN PAIRS FEW GRAM NEGATIVE RODS Performed at [facility]    Culture   Final    ABUNDANT ENTEROCOCCUS SPECIES Performed at [facility]    Report Status 05/16/2015 FINAL  Final   Organism ID, Bacteria ENTEROCOCCUS SPECIES  Final      Susceptibility   Enterococcus species - MIC*    VANCOMYCIN 2 SENSITIVE Sensitive     AMPICILLIN <=2 SENSITIVE Sensitive     * ABUNDANT ENTEROCOCCUS SPECIES     Labs: Basic Metabolic Panel:  Recent Labs Lab 05/13/15 0630 05/15/15 0405 05/18/15 1658 05/19/15 0618  NA 137 137 139  --   K 3.6 3.8 4.1  --   CL 104 102 103  --   CO2 23 24 26   --   GLUCOSE 97 92 100*  --   BUN 10 13 20   --   CREATININE 1.06 1.07 1.21 1.06  CALCIUM 8.8* 9.2 8.8*  --    CBC:  Recent Labs Lab 05/13/15 0630 05/18/15 1658  WBC 6.6 3.4*  NEUTROABS  --  0.6*  HGB 9.3* 9.2*  HCT 28.5* 28.1*  MCV 105.2* 103.7*  PLT 127* 182    Signed:  GHERGHE, COSTIN  [facility] 05/19/2015, 3:30 PM

## 2015-05-19 NOTE — Progress Notes (Signed)
Pharmacy Antibiotic Note  Joel Macdonald is a 82 y.o. male admitted on 05/12/2015 continuing on Ampicillin D#4/14 for deep tissue infxn RLL w/ +enterococcus. Repeat I&D 2/4 with placement of abx beads, debridement + wound vac 2/8. Afebrile, WBC low 3.4, CRP 2.5. Plan 2 wks abx via PICC.  Ampicillin >> Zosyn 2/3 >>2/7 Vanc 2/3 >>2/7 Keflex PTA [place] PTA  2/4 wound cx: enterococcus (S-amp) 11/17/14 wound cx - MSSA  Plan: - Ampicillin 2g IV q6h via PICC x 2 weeks - Monitor clinical progress, c/s, renal function, abx plan/LOT  Height: 6' (182.9 cm) Weight: 195 lb 5.2 oz (88.6 kg) IBW/kg (Calculated) : 77.6  Temp (24hrs), Avg:97.9 F (36.6 C), Min:97.7 F (36.5 C), Max:98.1 F (36.7 C)   Recent Labs Lab 05/12/15 1357 05/13/15 0630 05/15/15 0405 05/18/15 1658 05/19/15 0618  WBC 5.8 6.6  --  3.4*  --   CREATININE 0.99 1.06 1.07 1.21 1.06  VANCOTROUGH  --   --  12  --   --     Estimated Creatinine Clearance: 59 mL/min (by C-G formula based on Cr of 1.06).    Allergies  Allergen Reactions  . Nsaids Other (See Comments)    Nose bleeds from high doses of ibuprofen  . Azithromycin Nausea And Vomiting  . Other Other (See Comments)    Ragweed causes nasal congestion    Elicia Lamp, PharmD, [facility] Clinical Pharmacist Pager [phone] 05/19/2015 11:22 AM

## 2015-05-19 NOTE — Discharge Instructions (Signed)
Follow with Mathews Argyle, MD in 2-4 weeks   Please get a complete blood count and chemistry panel checked by your Primary MD at your next visit, and again as instructed by your Primary MD. Please get your medications reviewed and adjusted by your Primary MD.  Please request your Primary MD to go over all Hospital Tests and Procedure/Radiological results at the follow up, please get all Hospital records sent to your Prim MD by signing hospital release before you go home.  If you had Pneumonia of Lung problems at the Hospital: Please get a 2 view Chest X ray done in 6-8 weeks after hospital discharge or sooner if instructed by your Primary MD.  If you have Congestive Heart Failure: Please call your Cardiologist or Primary MD anytime you have any of the following symptoms:  1) 3 pound weight gain in 24 hours or 5 pounds in 1 week  2) shortness of breath, with or without a dry hacking cough  3) swelling in the hands, feet or stomach  4) if you have to sleep on extra pillows at night in order to breathe  Follow cardiac low salt diet and 1.5 lit/day fluid restriction.  If you have diabetes Accuchecks 4 times/day, Once in AM empty stomach and then before each meal. Log in all results and show them to your primary doctor at your next visit. If any glucose reading is under 80 or above 300 call your primary MD immediately.  If you have Seizure/Convulsions/Epilepsy: Please do not drive, operate heavy machinery, participate in activities at heights or participate in high speed sports until you have seen by Primary MD or a Neurologist and advised to do so again.  If you had Gastrointestinal Bleeding: Please ask your Primary MD to check a complete blood count within one week of discharge or at your next visit. Your endoscopic/colonoscopic biopsies that are pending at the time of discharge, will also need to followed by your Primary MD.  Get Medicines reviewed and adjusted. Please take all  your medications with you for your next visit with your Primary MD  Please request your Primary MD to go over all hospital tests and procedure/radiological results at the follow up, please ask your Primary MD to get all Hospital records sent to his/her office.  If you experience worsening of your admission symptoms, develop shortness of breath, life threatening emergency, suicidal or homicidal thoughts you must seek medical attention immediately by calling 911 or calling your MD immediately  if symptoms less severe.  You must read complete instructions/literature along with all the possible adverse reactions/side effects for all the Medicines you take and that have been prescribed to you. Take any new Medicines after you have completely understood and accpet all the possible adverse reactions/side effects.   Do not drive or operate heavy machinery when taking Pain medications.   Do not take more than prescribed Pain, Sleep and Anxiety Medications  Special Instructions: If you have smoked or chewed Tobacco  in the last 2 yrs please stop smoking, stop any regular Alcohol  and or any Recreational drug use.  Wear Seat belts while driving.  Please note You were cared for by a hospitalist during your hospital stay. If you have any questions about your discharge medications or the care you received while you were in the hospital after you are discharged, you can call the unit and asked to speak with the hospitalist on call if the hospitalist that took care of you is not available.  Once you are discharged, your primary care physician will handle any further medical issues. Please note that NO REFILLS for any discharge medications will be authorized once you are discharged, as it is imperative that you return to your primary care physician (or establish a relationship with a primary care physician if you do not have one) for your aftercare needs so that they can reassess your need for medications and monitor  your lab values.  You can reach the hospitalist office at phone [phone] or fax [phone]   If you do not have a primary care physician, you can call [phone] for a physician referral.  Activity: As tolerated with Full fall precautions use walker/cane & assistance as needed  Diet: regular  Disposition Home

## 2015-05-29 ENCOUNTER — Encounter: Payer: Self-pay | Admitting: Oncology

## 2015-05-29 ENCOUNTER — Encounter (HOSPITAL_COMMUNITY)
Admission: RE | Admit: 2015-05-29 | Discharge: 2015-05-29 | Disposition: A | Discharge status: Home / Self Care | Payer: Medicare Other | Source: Ambulatory Visit | Attending: Oncology | Admitting: Oncology

## 2015-05-29 ENCOUNTER — Ambulatory Visit (INDEPENDENT_AMBULATORY_CARE_PROVIDER_SITE_OTHER): Payer: Medicare Other | Admitting: Oncology

## 2015-05-29 VITALS — BP 164/80 | HR 71 | Temp 98.0°F | Resp 20

## 2015-05-29 VITALS — BP 145/86 | HR 72 | Temp 98.3°F | Ht 73.0 in | Wt 202.6 lb

## 2015-05-29 DIAGNOSIS — D72821 Monocytosis (symptomatic): Secondary | ICD-10-CM | POA: Diagnosis present

## 2015-05-29 DIAGNOSIS — D4620 Refractory anemia with excess of blasts, unspecified: Secondary | ICD-10-CM

## 2015-05-29 DIAGNOSIS — I878 Other specified disorders of veins: Secondary | ICD-10-CM

## 2015-05-29 DIAGNOSIS — D46Z Other myelodysplastic syndromes: Secondary | ICD-10-CM

## 2015-05-29 DIAGNOSIS — D61818 Other pancytopenia: Secondary | ICD-10-CM

## 2015-05-29 DIAGNOSIS — L0291 Cutaneous abscess, unspecified: Secondary | ICD-10-CM | POA: Diagnosis not present

## 2015-05-29 DIAGNOSIS — D472 Monoclonal gammopathy: Secondary | ICD-10-CM

## 2015-05-29 DIAGNOSIS — I739 Peripheral vascular disease, unspecified: Secondary | ICD-10-CM

## 2015-05-29 DIAGNOSIS — D619 Aplastic anemia, unspecified: Secondary | ICD-10-CM | POA: Insufficient documentation

## 2015-05-29 LAB — POCT HEMOGLOBIN-HEMACUE: HEMOGLOBIN: 8.1 g/dL — AB (ref 13.0–17.0)

## 2015-05-29 MED ORDER — DARBEPOETIN ALFA 200 MCG/0.4ML IJ SOSY
PREFILLED_SYRINGE | INTRAMUSCULAR | Status: AC
  Administered 2015-05-29: 400 ug via SUBCUTANEOUS
  Filled 2015-05-29: qty 0.8

## 2015-05-29 MED ORDER — DARBEPOETIN ALFA 100 MCG/0.5ML IJ SOSY: 400.0000 ug | PREFILLED_SYRINGE | INTRAMUSCULAR | Status: DC

## 2015-05-29 NOTE — Progress Notes (Signed)
Patient ID: Joel Macdonald, male   DOB: 1930-11-11, 82 y.o.   MRN: [id number] Hematology and Oncology Follow Up Visit  Joel Macdonald [id number] 03/13/1931 82 y.o. 05/29/2015 5:29 PM   Principle Diagnosis: Encounter Diagnoses  Name Primary?  . Pancytopenia, acquired ([place])   . MDS (myelodysplastic syndrome), low grade ([place])   . MDS (myelodysplastic syndrome), high grade ([place]) Yes  . MGUS (monoclonal gammopathy of unknown significance)   . Abscess   . 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 In addition, he has prostate cancer in remission. He initially presented with pancytopenia in February 2013. No excess blasts on that bone marrow and he was followed with observation alone until he developed progressive, transfusion dependent, anemia and monocytosis. Bone marrow aspiration and biopsy repeated on 05/05/2014 now showed excess blasts of 7%. Normal cytogenetics. In view of his age and comorbid conditions, I elected not to put him on a chemotherapy program. He was started on Aranesp subcutaneous every 3 weeks to support his red cells and this has been successful in keeping his hemoglobin in the 9-10 gram range and sparing him from blood transfusions. He has developed a number of other problems summarized in my office note of 02/20/2015. He developed refractory unexplained bilateral peripheral edema with a negative cardiac and renal evaluation. No evidence for blood clots. Secondary to the persistent edema, he developed recurrent episodes of bilateral cellulitis requiring multiple courses of antibiotics. At time of a 11/18/2014 visit, he had developed a superficial abscess on the skin of his left leg which was incised and drained in the office. He  Presented to the hospital on 05/12/2015 with a nonhealing ulcer with purulent discharge  anterolateral aspect of his right ankle which began to develop just 2 days before Christmas. Has had a number of courses of oral antibiotics with doxycycline and Keflex. He was referred back to the wound center. He had another incision and drainage procedure and packing was left in the wound. Since there was been no significant improvement, a CT scan of the right ankle was done on February 1 which does show a superficial collection of gas and fluid in the subcutaneous fat suspicious for residual abscess but no involvement of the underlying muscle or bone.  Interim History:   As noted above, he was recently admitted to the hospital on February 3. He developed a large,nonhealing, ulcerated wound over a approximate 20 cm area in length from ankle to mid shin. He had no response to aggressive outpatient oral antibiotics and local wound care at the wound care center and was referred for hospital admission by Dr. Dellia Nims to receive parenteral antibiotics and orthopedic surgery consultation. The wound required extensive debridement 2. A skin graft was placed. He was seen in consultation by orthopedic surgery as well as infectious disease. Wound cultures taken at time of debridement showed ampicillin sensitive enterococcus. Antibiotics changed from initial vancomycin and Zosyn to ampicillin. A PICC catheter was placed right proximal arm. He was sent home on a continuous infusion of ampicillin with planned 14 day total course.  He has been stable since discharge on February 10. Of note he never had no fever or leukocytosis. He has chronic monocytosis secondary to his underlying MDS. There were no signs of leukemic conversion but I believe he has neutrophil dysfunction as the reason why his wounds are slow to heal. Appetite is down. Fingerstick  hemoglobin today lower than his baseline likely related to infection and parenteral antibiotics. He continues on every three-week Aranesp.  His wife has to have an aortic valve  replacement tomorrow. His son who is a cardiologist is flying in from [place]. His other son who lives at home is handicapped.  Medications: reviewed  Allergies:  Allergies  Allergen Reactions  . Nsaids Other (See Comments)    Nose bleeds from high doses of ibuprofen  . Azithromycin Nausea And Vomiting  . Other Other (See Comments)    Ragweed causes nasal congestion    Review of Systems: See Interim Summary Remaining ROS negative:   Physical Exam: Blood pressure 145/86, pulse 72, temperature 98.3 F (36.8 C), temperature source Oral, height 6\' 1"  (1.854 m), weight 202 lb 9.6 oz (91.899 kg), SpO2 100 %. Wt Readings from Last 3 Encounters:  05/29/15 202 lb 9.6 oz (91.899 kg)  05/12/15 195 lb 5.2 oz (88.6 kg)  02/20/15 198 lb 11.2 oz (90.13 kg)     General appearance: adequately nourished Caucasian man HENNT: Pharynx no erythema, exudate, mass, or ulcer. No thyromegaly or thyroid nodules Lymph nodes: No cervical, supraclavicular, or axillary lymphadenopathy Breasts:  Lungs: Clear to auscultation, resonant to percussion throughout Heart: Regular rhythm, no murmur, no gallop, no rub, no click, no edema Abdomen: Soft, nontender, normal bowel sounds, no mass, no organomegaly Extremities:bilateral ankle and calf edema,right worse than left, no calf tenderness; extensive venous stasis changes bilaterally. Musculoskeletal: no joint deformities GU:  Vascular: Carotid pulses 2+, no bruits,  Neurologic: Alert, oriented, PERRLA,  cranial nerves grossly normal, motor strength 5 over 5, reflexes 1+ symmetric, upper body coordination normal, gait normal, Skin: multiple areas of purpura on the skin of his forearms. Examination of the wound above the right ankle shows that it is now healing well. Healthy granulation tissue. Some surgical staples still in place distally. There is a small dime-sized lesion medial aspect of the right calf where a squamous cell carcinoma was recently  excised.  Lab Results: CBC W/Diff    Component Value Date/Time   WBC 3.4* 05/18/2015 1658   WBC 5.3 05/09/2015 1027   WBC 3.0* 05/17/2013 1258   RBC 2.71* 05/18/2015 1658   RBC 2.84* 05/09/2015 1027   RBC 3.13* 06/13/2014 1022   RBC 3.01* 05/17/2013 1258   HGB 8.1* 05/29/2015 1225   HGB 10.4* 05/17/2013 1258   HCT 28.1* 05/18/2015 1658   HCT 28.9* 05/09/2015 1027   HCT 31.5* 05/17/2013 1258   PLT 182 05/18/2015 1658   PLT 154 05/09/2015 1027   PLT 84* 05/17/2013 1258   MCV 103.7* 05/18/2015 1658   MCV 102* 05/09/2015 1027   MCV 104.7* 05/17/2013 1258   MCH 33.9 05/18/2015 1658   MCH 33.5* 05/09/2015 1027   MCH 34.6* 05/17/2013 1258   MCHC 32.7 05/18/2015 1658   MCHC 32.9 05/09/2015 1027   MCHC 33.0 05/17/2013 1258   RDW 15.8* 05/18/2015 1658   RDW 16.4* 05/09/2015 1027   RDW 16.0* 05/17/2013 1258   LYMPHSABS 1.5 05/18/2015 1658   LYMPHSABS 1.7 05/09/2015 1027   LYMPHSABS 1.5 05/17/2013 1258   MONOABS 1.3* 05/18/2015 1658   MONOABS 0.8 05/17/2013 1258   EOSABS 0.0 05/18/2015 1658   EOSABS 0.0 05/09/2015 1027   EOSABS 0.0 05/17/2013 1258   BASOSABS 0.0 05/18/2015 1658   BASOSABS 0.0 05/09/2015 1027   BASOSABS 0.0 05/17/2013 1258     Chemistry      Component Value Date/Time   NA  139 05/18/2015 1658   NA 139 05/09/2015 1027   NA 139 11/09/2012 1425   K 4.1 05/18/2015 1658   K 4.2 11/09/2012 1425   CL 103 05/18/2015 1658   CO2 26 05/18/2015 1658   CO2 24 11/09/2012 1425   BUN 20 05/18/2015 1658   BUN 24 05/09/2015 1027   BUN 22.4 11/09/2012 1425   CREATININE 1.06 05/19/2015 0618   CREATININE 0.88 10/17/2014 1101   CREATININE 1.39* 03/23/2014 0730   CREATININE 1.2 11/09/2012 1425      Component Value Date/Time   CALCIUM 8.8* 05/18/2015 1658   CALCIUM 9.3 11/09/2012 1425   ALKPHOS 68 05/12/2015 1357   ALKPHOS 62 11/09/2012 1425   AST 16 05/12/2015 1357   AST 20 11/09/2012 1425   ALT 11* 05/12/2015 1357   ALT 19 11/09/2012 1425   BILITOT 0.5 05/12/2015  1357   BILITOT 0.4 05/09/2015 1027   BILITOT 0.57 11/09/2012 1425       Radiological Studies: Ct Tibia Fibula Right W Contrast  05/10/2015  CLINICAL DATA:  Evaluate for recurrent right lower leg abscess. History of bilateral leg abscesses with debridement 2 weeks ago. History of myelodysplastic syndrome and prostate cancer. EXAM: CT OF THE LOWER RIGHT EXTREMITY WITH CONTRAST TECHNIQUE: Multidetector CT imaging of the right lower leg was performed according to the standard protocol following intravenous contrast administration. COMPARISON:  None. CONTRAST:  151mL ISOVUE-300 IOPAMIDOL (ISOVUE-300) INJECTION 61% FINDINGS: There is an area of skin irregularity posterolaterally in the distal third of the right lower leg, presumably at the site of recent surgical debridement. Within the underlying subcutaneous fat, there is a complex ill-defined collection of fluid and gas. This measures up to 3.4 x 1.5 cm on axial image 135 and extends approximately 3.9 cm cephalocaudad. No foreign bodies are apparent in this area. The inflammatory changes appear confined to the subcutaneous fat. The underlying muscles demonstrate normal enhancement and no focal fluid collection. There is generalized subcutaneous edema throughout the distal lower leg. No other focal fluid collections are seen. There are some muscular venous collateral vessels. Mild popliteal arterial atherosclerosis noted. There is no evidence of acute fracture, dislocation or bone destruction. Relatively mild degenerative changes are present at the knee and ankle. No significant joint effusions seen. IMPRESSION: 1. Superficial collection of gas and fluid in the subcutaneous fat of the distal lower leg posterolaterally, suspicious for residual abscess status post recent debridement. No involvement of the underlying musculature identified. 2. No evidence of osteomyelitis or acute osseous findings. 3. These results will be called to the ordering clinician or  representative by the Radiologist Assistant, and communication documented in the PACS or zVision Dashboard. Electronically Signed   By: Richardean Sale M.D.   On: 05/10/2015 16:20    Impression:  #1. High risk myelodysplastic syndrome evolving out of a low risk MDS. Currently receiving supportive care only with every 3 weekly Aranesp injections to support his hemoglobin. Platelet count has been consistently normal. He has chronic monocytosis but no signs yet for transformation to acute leukemia.I will continue to monitor blood counts frequently. If leukemic conversion occurs, due to his other comorbid medical conditions and age,he would not be a candidate for intensive antileukemic chemotherapy.  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 Recurrent cutaneous abscesses and bilateral lower extremity cellulitis secondary to #1 complicated by chronic venous stasis.see discussion above. He will continue close  follow-up with orthopedic surgery and infectious disease. He isgoing to complete prescribed 14 day course of parenteral antibiotics  this week.  #4.chronic macrocytic anemia secondary to #1  #5.IgM monoclonal gammopathy of undetermined significance  #6. History of prostate cancer treated with prostatectomy.  #7. Status post excision squamous cell carcinoma left calf.   CC: Patient Care Team: Lajean Manes, MD as PCP - General (Internal Medicine) Annia Belt, MD as Consulting Physician (Oncology)   Annia Belt, MD 2/20/20175:29 PM

## 2015-05-29 NOTE — Patient Instructions (Signed)
Continue every 3 week aranesp injections Return visit with Dr Darnell Level in May 1 -2 weeks after lab Lab on May 8

## 2015-06-01 ENCOUNTER — Ambulatory Visit (INDEPENDENT_AMBULATORY_CARE_PROVIDER_SITE_OTHER): Payer: Medicare Other | Admitting: Internal Medicine

## 2015-06-01 ENCOUNTER — Telehealth: Payer: Self-pay | Admitting: *Deleted

## 2015-06-01 ENCOUNTER — Encounter: Payer: Self-pay | Admitting: Internal Medicine

## 2015-06-01 VITALS — BP 165/80 | HR 71 | Temp 97.5°F | Wt 198.0 lb

## 2015-06-01 DIAGNOSIS — B952 Enterococcus as the cause of diseases classified elsewhere: Secondary | ICD-10-CM

## 2015-06-01 DIAGNOSIS — A491 Streptococcal infection, unspecified site: Secondary | ICD-10-CM

## 2015-06-01 DIAGNOSIS — S81801S Unspecified open wound, right lower leg, sequela: Secondary | ICD-10-CM | POA: Diagnosis not present

## 2015-06-01 MED ORDER — AMOXICILLIN 500 MG PO TABS: 500.0000 mg | ORAL_TABLET | Freq: Two times a day (BID) | ORAL | Status: DC

## 2015-06-01 NOTE — Progress Notes (Signed)
Rfv: follow up on enterococcal ulcerative wound to right leg Subjective:    Patient ID: Joel Macdonald, male    DOB: July 30, 1930, 82 y.o.   MRN: [id number]  HPI 82yo M with myelodysplastic syndrome who has recurrent right leg ulcer, failed numerous courses of oral abtx. Recently hospitalized for debridement of right leg, cultures + enterococcus, treated with IV ampicillin x 14 d. Which finishes today. Tolerating his medications without difficulty. Sees dr. Sharol Given later today  Allergies  Allergen Reactions  . Azithromycin Nausea And Vomiting  . Other Other (See Comments)    Ragweed causes nasal congestion    Current Outpatient Prescriptions on File Prior to Visit  Medication Sig Dispense Refill  . acetaminophen (TYLENOL) 500 MG tablet Take 500 mg by mouth every 4 (four) hours as needed (pain).    Marland Kitchen ampicillin 2 g in sodium chloride 0.9 % 50 mL Inject 2 g into the vein every 6 (six) hours. (Patient not taking: Reported on 06/12/2015) 112 g 0  . Aspirin Effervescent (ALKA-SELTZER EXTRA STRENGTH) 500 MG TBEF Take 1,000 mg by mouth every 4 (four) hours as needed (pain/ indigestion/ heartburn).    . clobetasol ointment (TEMOVATE) AB-123456789 % Apply 1 application topically 2 (two) times daily as needed (psoriasis). Reported on 06/26/2015    . fluticasone (FLONASE) 50 MCG/ACT nasal spray Place 1 spray into both nostrils daily as needed (seasonal allergies).    Marland Kitchen ibuprofen (ADVIL,MOTRIN) 200 MG tablet Take 400 mg by mouth every 4 (four) hours as needed for mild pain (pain).     Marland Kitchen levothyroxine (SYNTHROID, LEVOTHROID) 50 MCG tablet Take 50 mcg by mouth daily before breakfast.    . ranitidine (ZANTAC) 150 MG tablet Take 150 mg by mouth daily as needed for heartburn.     . triamcinolone cream (KENALOG) 0.1 % Apply 1 application topically daily as needed (wound care/ dry skin). Reported on 06/26/2015     No current facility-administered medications on file prior to visit.   Active Ambulatory Problems   Diagnosis Date Noted  . SKIN CANCER 06/05/2006  . ADENOCARCINOMA, PROSTATE, GLEASON GRADE 7 10/17/2009  . HYPERTENSION, BENIGN SYSTEMIC 06/05/2006  . RHINITIS, ALLERGIC 06/05/2006  . REFLUX ESOPHAGITIS 06/05/2006  . HIP PAIN, BILATERAL 11/07/2009  . BACK PAIN, LUMBAR 10/17/2009  . Sciatica 10/17/2009  . COMPRESSION FRACTURE, THORACIC VERTEBRA 11/07/2009  . Pancytopenia, acquired ([place]) 06/17/2011  . MGUS (monoclonal gammopathy of unknown significance) 06/17/2011  . MDS (myelodysplastic syndrome), low grade ([place]) 09/20/2011  . Compression fracture of T12 vertebra ([place]) 10/08/2011  . Cellulitis of leg, right 07/12/2013  . Ischemia of extremity 07/12/2013  . Cellulitis of leg, left 07/12/2013  . Cellulitis and abscess of leg 07/12/2013  . Vasculitis ([place]) 08/02/2013  . Peripheral vascular disease, unspecified ([place]) 08/03/2013  . Edema, peripheral 03/21/2014  . Monocytosis 03/21/2014  . MDS (myelodysplastic syndrome), high grade ([place]) 06/13/2014  . Senile purpura ([place]) 10/18/2014  . Abscess of skin 11/17/2014  . Abscess   . Enterococcus faecalis infection    Resolved Ambulatory Problems    Diagnosis Date Noted  . No Resolved Ambulatory Problems   Past Medical History  Diagnosis Date  . Heart murmur 1936  . GERD (gastroesophageal reflux disease)   . Osteoarthritis   . Prostate cancer [facility])       Review of Systems + leg wound. Otherwise 10 point ROS is negative    Objective:   Physical Exam  BP 165/80 mmHg  Pulse 71  Temp(Src) 97.5 F (36.4 C) (  Oral)  Wt 198 lb (89.812 kg)  Skin = 14 x 11cm with graft. Some retained staples     Assessment & Plan:  Enterococcal leg wound =Will change him over to amox 500mg  TID x 10d after he finishes his IV abtx

## 2015-06-01 NOTE — Telephone Encounter (Signed)
Per verbal order from Dr Baxter Flattery called [facility] and gave pull date of 06/02/15 for the patient PICC.

## 2015-06-12 ENCOUNTER — Ambulatory Visit (INDEPENDENT_AMBULATORY_CARE_PROVIDER_SITE_OTHER): Payer: Medicare Other | Admitting: Internal Medicine

## 2015-06-12 ENCOUNTER — Encounter: Payer: Self-pay | Admitting: Internal Medicine

## 2015-06-12 VITALS — BP 146/78 | HR 77 | Temp 98.1°F | Wt 193.0 lb

## 2015-06-12 DIAGNOSIS — T148 Other injury of unspecified body region: Secondary | ICD-10-CM

## 2015-06-12 DIAGNOSIS — L089 Local infection of the skin and subcutaneous tissue, unspecified: Secondary | ICD-10-CM | POA: Diagnosis not present

## 2015-06-12 DIAGNOSIS — A491 Streptococcal infection, unspecified site: Secondary | ICD-10-CM

## 2015-06-12 DIAGNOSIS — D469 Myelodysplastic syndrome, unspecified: Secondary | ICD-10-CM

## 2015-06-12 DIAGNOSIS — B952 Enterococcus as the cause of diseases classified elsewhere: Secondary | ICD-10-CM

## 2015-06-12 DIAGNOSIS — T148XXA Other injury of unspecified body region, initial encounter: Secondary | ICD-10-CM

## 2015-06-12 NOTE — Progress Notes (Signed)
RFV: enterococcal deep wound/cellulitis Subjective:    Patient ID: Joel Macdonald, male    DOB: 06/01/1930, 82 y.o.   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 He had recent admission for right leg wound/cellulitis not responding to oral abtx, thus admitted for IV abtx and surgical debridement with porcine material graft. OR cultures growing amp S enterococcus. He was placed on IV abtx for 14 days then transitioned to oral amoxicillin for which he has 5 days left of abtx. Slowly healing. He has followed up with dr. Sharol Given who reports that it is healing slowly but not seeing signs of infection. He does daily dressing changes and ace wrap to his right lower leg to help with swelling.  he denies any fever, chills, no purulent drainage from wound  Allergies  Allergen Reactions  . Azithromycin Nausea And Vomiting  . Other Other (See Comments)    Ragweed causes nasal congestion   Current Outpatient Prescriptions on File Prior to Visit  Medication Sig Dispense Refill  . acetaminophen (TYLENOL) 500 MG tablet Take 500 mg by mouth every 4 (four) hours as needed (pain).    Marland Kitchen amoxicillin (AMOXIL) 500 MG tablet Take 1 tablet (500 mg total) by mouth 2 (two) times daily. 30 tablet 0  . Aspirin Effervescent (ALKA-SELTZER EXTRA STRENGTH) 500 MG TBEF Take 1,000 mg by mouth every 4 (four) hours as needed (pain/ indigestion/ heartburn).    . clobetasol ointment (TEMOVATE) [id number] % Apply 1 application topically 2 (two) times daily as needed (psoriasis).     . fluticasone (FLONASE) 50 MCG/ACT nasal spray Place 1 spray into both nostrils daily as needed (seasonal allergies).    Marland Kitchen ibuprofen (ADVIL,MOTRIN) 200 MG tablet Take 400 mg by mouth every 4 (four) hours as needed for mild pain (pain).     Marland Kitchen levothyroxine (SYNTHROID, LEVOTHROID) 50 MCG tablet Take 50 mcg by mouth daily before breakfast.    . ranitidine (ZANTAC) 150 MG tablet Take 150 mg by  mouth daily as needed for heartburn.     . triamcinolone cream (KENALOG) 0.1 % Apply 1 application topically daily as needed (wound care/ dry skin).     Marland Kitchen ampicillin 2 g in sodium chloride 0.9 % 50 mL Inject 2 g into the vein every 6 (six) hours. (Patient not taking: Reported on 06/12/2015) 112 g 0   No current facility-administered medications on file prior to visit.   Active Ambulatory Problems    Diagnosis Date Noted  . SKIN CANCER 06/05/2006  . ADENOCARCINOMA, PROSTATE, GLEASON GRADE 7 10/17/2009  . HYPERTENSION, BENIGN SYSTEMIC 06/05/2006  . RHINITIS, ALLERGIC 06/05/2006  . REFLUX ESOPHAGITIS 06/05/2006  . HIP PAIN, BILATERAL 11/07/2009  . BACK PAIN, LUMBAR 10/17/2009  . Sciatica 10/17/2009  . COMPRESSION FRACTURE, THORACIC VERTEBRA 11/07/2009  . Pancytopenia, acquired ([place]) 06/17/2011  . MGUS (monoclonal gammopathy of unknown significance) 06/17/2011  . MDS (myelodysplastic syndrome), low grade ([place]) 09/20/2011  . Compression fracture of T12 vertebra ([place]) 10/08/2011  . Cellulitis of leg, right 07/12/2013  . Ischemia of extremity 07/12/2013  . Cellulitis of leg, left 07/12/2013  . Cellulitis and abscess of leg 07/12/2013  . Vasculitis ([place]) 08/02/2013  . Peripheral vascular disease, unspecified ([place]) 08/03/2013  . Edema, peripheral 03/21/2014  . Monocytosis 03/21/2014  . MDS (myelodysplastic syndrome), high grade ([place]) 06/13/2014  . Senile purpura ([place]) 10/18/2014  . Abscess of skin 11/17/2014  . Abscess   . Enterococcus  faecalis infection    Resolved Ambulatory Problems    Diagnosis Date Noted  . No Resolved Ambulatory Problems   Past Medical History  Diagnosis Date  . Heart murmur 1936  . GERD (gastroesophageal reflux disease)   . Osteoarthritis   . Prostate cancer [facility])    Social History  Substance Use Topics  . Smoking status: Former Smoker -- 40 years    Types: Pipe    Quit date: 07/12/1993  . Smokeless tobacco: Never Used  . Alcohol Use: 0.0 oz/week    0  Standard drinks or equivalent per week     Comment: occasionally.  family history includes Heart disease in his father; Hyperlipidemia in his son; Other in his father and mother.  Review of Systems Review of Systems  Constitutional: Negative for fever, chills, diaphoresis, activity change, appetite change, fatigue and unexpected weight change.  HENT: Negative for congestion, sore throat, rhinorrhea, sneezing, trouble swallowing and sinus pressure.  Eyes: Negative for photophobia and visual disturbance.  Respiratory: Negative for cough, chest tightness, shortness of breath, wheezing and stridor.  Cardiovascular: Negative for chest pain, palpitations and leg swelling.  Gastrointestinal: Negative for nausea, vomiting, abdominal pain, diarrhea, constipation, blood in stool, abdominal distention and anal bleeding.  Genitourinary: Negative for dysuria, hematuria, flank pain and difficulty urinating.  Musculoskeletal: Negative for myalgias, back pain, joint swelling, arthralgias and gait problem.  Skin: Negative for color change, pallor, rash and wound.  Neurological: Negative for dizziness, tremors, weakness and light-headedness.  Hematological: Negative for adenopathy. Does not bruise/bleed easily.  Psychiatric/Behavioral: Negative for behavioral problems, confusion, sleep disturbance, dysphoric mood, decreased concentration and agitation.       Objective:   Physical Exam BP 146/78 mmHg  Pulse 77  Temp(Src) 98.1 F (36.7 C) (Oral)  Wt 193 lb (87.544 kg) gen = a xo by 3 in NAD Ext= +1 edema to dorsum of foot Skin = hemosiderin deposition, wound bed still has graft material in wound bed thus difficult to assess for granulation tissue. Edges of wound has areas of good granualtion tissue. No surrounding erythema. Measurement similar to last visit, essentially unchanged       Assessment & Plan:  Enterococcal wound infection = continue with amoxicillin for addn 5 days, then we will observe  off of therapy. Tolerating amox without diarrhea, thrush. Dressing changes daily with silver based ointment, as directed by Dr. Sharol Given  MDS = stable , followed by dr. Beryle Beams Follow up in 2 wk

## 2015-06-19 ENCOUNTER — Encounter (HOSPITAL_COMMUNITY)
Admission: RE | Admit: 2015-06-19 | Discharge: 2015-06-19 | Disposition: A | Discharge status: Home / Self Care | Payer: Medicare Other | Source: Ambulatory Visit | Attending: Oncology | Admitting: Oncology

## 2015-06-19 DIAGNOSIS — D61818 Other pancytopenia: Secondary | ICD-10-CM

## 2015-06-19 DIAGNOSIS — D619 Aplastic anemia, unspecified: Secondary | ICD-10-CM | POA: Diagnosis present

## 2015-06-19 DIAGNOSIS — D72821 Monocytosis (symptomatic): Secondary | ICD-10-CM | POA: Diagnosis present

## 2015-06-19 DIAGNOSIS — D46Z Other myelodysplastic syndromes: Secondary | ICD-10-CM | POA: Diagnosis not present

## 2015-06-19 DIAGNOSIS — D4620 Refractory anemia with excess of blasts, unspecified: Secondary | ICD-10-CM

## 2015-06-19 MED ORDER — DARBEPOETIN ALFA 200 MCG/0.4ML IJ SOSY
400.0000 ug | PREFILLED_SYRINGE | Freq: Once | INTRAMUSCULAR | Status: AC
  Administered 2015-06-19: 400 ug via SUBCUTANEOUS

## 2015-06-19 MED ORDER — DARBEPOETIN ALFA 200 MCG/0.4ML IJ SOSY
PREFILLED_SYRINGE | INTRAMUSCULAR | Status: AC
  Filled 2015-06-19: qty 0.8

## 2015-06-20 LAB — POCT HEMOGLOBIN-HEMACUE: HEMOGLOBIN: 10.2 g/dL — AB (ref 13.0–17.0)

## 2015-06-26 ENCOUNTER — Ambulatory Visit (INDEPENDENT_AMBULATORY_CARE_PROVIDER_SITE_OTHER): Payer: Medicare Other | Admitting: Internal Medicine

## 2015-06-26 ENCOUNTER — Encounter: Payer: Self-pay | Admitting: Internal Medicine

## 2015-06-26 VITALS — BP 121/73 | HR 98 | Temp 98.3°F | Wt 192.0 lb

## 2015-06-26 DIAGNOSIS — T798XXS Other early complications of trauma, sequela: Secondary | ICD-10-CM

## 2015-06-26 MED ORDER — AMOXICILLIN-POT CLAVULANATE 875-125 MG PO TABS: 1.0000 | ORAL_TABLET | Freq: Two times a day (BID) | ORAL | Status: DC

## 2015-06-26 MED ORDER — COLLAGENASE 250 UNIT/GM EX OINT: 1.0000 "application " | TOPICAL_OINTMENT | Freq: Every day | CUTANEOUS | Status: DC

## 2015-06-26 NOTE — Progress Notes (Signed)
  Rfv: follow up to wound infection, enterococcal Subjective:    Patient ID: Joel Macdonald, male    DOB: [birth year], 82 y.o.   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 He underwent i x d with porcine graft. Wound cx at that time grew enterococcus. He was given iv therapy with ampicillin plus a few weeks of oral amox. He states that he finished his course 2 wks ago. He saw dr. Sharol Given roughly 2 wk ago and at that time recommended daily silversaldine  It has a heavy purulent exudate though no smell. Patient is unclear if this is worse than before. Per my last note i feel that this worse than before, though he had porcine graft cover that appears that it is removed. The wound is 13.5cm wide by 13.5 length, heavy tannish exudate on lower third  Review of Systems No fever, chills, nightsweats, no worsening redness, but has drainage on wound    Objective:   Physical Exam BP 121/73 mmHg  Pulse 98  Temp(Src) 98.3 F (36.8 C) (Oral)  Wt 192 lb (87.091 kg) gen = a xo by 3 in nad Skin = right leg has heavy purulent drainage on bandages. Measures 13.5 cm by 13.5 in length anterior shin       Assessment & Plan:  - will do santyl to lower half of wound daily dressing, wet to dray - see duda for input on Thursday - go back to wound care clinic as well - will start him on amox/clav  rtc in 2 wk

## 2015-07-03 ENCOUNTER — Ambulatory Visit: Payer: Medicare Other | Admitting: Internal Medicine

## 2015-07-10 ENCOUNTER — Encounter (HOSPITAL_COMMUNITY)
Admission: RE | Admit: 2015-07-10 | Discharge: 2015-07-10 | Disposition: A | Discharge status: Home / Self Care | Payer: Medicare Other | Source: Ambulatory Visit | Attending: Oncology | Admitting: Oncology

## 2015-07-10 VITALS — BP 151/80 | HR 85 | Temp 97.8°F | Resp 20

## 2015-07-10 DIAGNOSIS — D61818 Other pancytopenia: Secondary | ICD-10-CM

## 2015-07-10 DIAGNOSIS — D619 Aplastic anemia, unspecified: Secondary | ICD-10-CM | POA: Diagnosis present

## 2015-07-10 DIAGNOSIS — D46Z Other myelodysplastic syndromes: Secondary | ICD-10-CM | POA: Insufficient documentation

## 2015-07-10 DIAGNOSIS — D72821 Monocytosis (symptomatic): Secondary | ICD-10-CM | POA: Insufficient documentation

## 2015-07-10 DIAGNOSIS — D4620 Refractory anemia with excess of blasts, unspecified: Secondary | ICD-10-CM

## 2015-07-10 LAB — POCT HEMOGLOBIN-HEMACUE: HEMOGLOBIN: 9.4 g/dL — AB (ref 13.0–17.0)

## 2015-07-10 MED ORDER — DARBEPOETIN ALFA 200 MCG/0.4ML IJ SOSY
PREFILLED_SYRINGE | INTRAMUSCULAR | Status: DC
Start: 2015-07-10 — End: 2015-07-11
  Filled 2015-07-10: qty 0.4

## 2015-07-10 MED ORDER — DARBEPOETIN ALFA 300 MCG/0.6ML IJ SOSY
300.0000 ug | PREFILLED_SYRINGE | INTRAMUSCULAR | Status: DC
  Administered 2015-07-10: 300 ug via SUBCUTANEOUS
  Filled 2015-07-10: qty 0.6

## 2015-07-10 MED ORDER — DARBEPOETIN ALFA 100 MCG/0.5ML IJ SOSY
PREFILLED_SYRINGE | INTRAMUSCULAR | Status: DC
Start: 2015-07-10 — End: 2015-07-11
  Filled 2015-07-10: qty 0.5

## 2015-07-11 ENCOUNTER — Encounter: Payer: Self-pay | Admitting: Internal Medicine

## 2015-07-11 ENCOUNTER — Ambulatory Visit (INDEPENDENT_AMBULATORY_CARE_PROVIDER_SITE_OTHER): Payer: Medicare Other | Admitting: Internal Medicine

## 2015-07-11 VITALS — BP 132/80 | HR 78 | Temp 98.0°F | Ht 73.0 in | Wt 196.5 lb

## 2015-07-11 DIAGNOSIS — T798XXS Other early complications of trauma, sequela: Secondary | ICD-10-CM | POA: Diagnosis not present

## 2015-07-11 DIAGNOSIS — B952 Enterococcus as the cause of diseases classified elsewhere: Secondary | ICD-10-CM

## 2015-07-11 DIAGNOSIS — A491 Streptococcal infection, unspecified site: Secondary | ICD-10-CM

## 2015-07-11 MED FILL — Darbepoetin Alfa Soln Prefilled Syringe 100 MCG/0.5ML: INTRAMUSCULAR | Qty: 0.5 | Status: AC

## 2015-07-11 MED FILL — Darbepoetin Alfa Soln Prefilled Syringe 200 MCG/0.4ML: INTRAMUSCULAR | Qty: 0.4 | Status: AC

## 2015-07-11 NOTE — Progress Notes (Signed)
RFV: follow up on cellulitis/deep tissue infection Subjective:    Patient ID: Joel Macdonald, male    DOB: 10/25/1930, 82 y.o.   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 E failed multiple rounds of antibiotics ,and eventually was hospitalized for I x D of affected area. His wound OR cx grew enterococcus. Initially treated with IV ampicillin x 14 d then 10 d course of amoxicillin. At the last visit, he had heavy exudate to wound bed thus extended abtx of amox/clav x 10 days which he finished yesterday. He saw dr duda last week nad transitioned him off of santyl and placed him on sliver/copper compression socks. The wound bed is clean now, beefy red, though easily to bleed when removing bandage  Current Outpatient Prescriptions on File Prior to Visit  Medication Sig Dispense Refill  . acetaminophen (TYLENOL) 500 MG tablet Take 500 mg by mouth every 4 (four) hours as needed (pain).    Marland Kitchen amoxicillin (AMOXIL) 500 MG tablet Take 1 tablet (500 mg total) by mouth 2 (two) times daily. (Patient not taking: Reported on 06/26/2015) 30 tablet 0  . amoxicillin-clavulanate (AUGMENTIN) 875-125 MG tablet Take 1 tablet by mouth 2 (two) times daily. 30 tablet 0  . ampicillin 2 g in sodium chloride 0.9 % 50 mL Inject 2 g into the vein every 6 (six) hours. (Patient not taking: Reported on 06/12/2015) 112 g 0  . Aspirin Effervescent (ALKA-SELTZER EXTRA STRENGTH) 500 MG TBEF Take 1,000 mg by mouth every 4 (four) hours as needed (pain/ indigestion/ heartburn).    . clobetasol ointment (TEMOVATE) AB-123456789 % Apply 1 application topically 2 (two) times daily as needed (psoriasis). Reported on 06/26/2015    . collagenase (SANTYL) ointment Apply 1 application topically daily. 15 g 0  . fluticasone (FLONASE) 50 MCG/ACT nasal spray Place 1 spray into both nostrils daily as needed (seasonal allergies).    Marland Kitchen ibuprofen (ADVIL,MOTRIN) 200 MG tablet Take 400  mg by mouth every 4 (four) hours as needed for mild pain (pain).     Marland Kitchen levothyroxine (SYNTHROID, LEVOTHROID) 50 MCG tablet Take 50 mcg by mouth daily before breakfast.    . ranitidine (ZANTAC) 150 MG tablet Take 150 mg by mouth daily as needed for heartburn.     . triamcinolone cream (KENALOG) 0.1 % Apply 1 application topically daily as needed (wound care/ dry skin). Reported on 06/26/2015     No current facility-administered medications on file prior to visit.   Active Ambulatory Problems    Diagnosis Date Noted  . SKIN CANCER 06/05/2006  . ADENOCARCINOMA, PROSTATE, GLEASON GRADE 7 10/17/2009  . HYPERTENSION, BENIGN SYSTEMIC 06/05/2006  . RHINITIS, ALLERGIC 06/05/2006  . REFLUX ESOPHAGITIS 06/05/2006  . HIP PAIN, BILATERAL 11/07/2009  . BACK PAIN, LUMBAR 10/17/2009  . Sciatica 10/17/2009  . COMPRESSION FRACTURE, THORACIC VERTEBRA 11/07/2009  . Pancytopenia, acquired ([place]) 06/17/2011  . MGUS (monoclonal gammopathy of unknown significance) 06/17/2011  . MDS (myelodysplastic syndrome), low grade ([place]) 09/20/2011  . Compression fracture of T12 vertebra ([place]) 10/08/2011  . Cellulitis of leg, right 07/12/2013  . Ischemia of extremity 07/12/2013  . Cellulitis of leg, left 07/12/2013  . Cellulitis and abscess of leg 07/12/2013  . Vasculitis ([place]) 08/02/2013  . Peripheral vascular disease, unspecified ([place]) 08/03/2013  . Edema, peripheral 03/21/2014  . Monocytosis 03/21/2014  . MDS (myelodysplastic syndrome), high grade ([place]) 06/13/2014  . Senile purpura ([place]) 10/18/2014  .  Abscess of skin 11/17/2014  . Abscess   . Enterococcus faecalis infection    Resolved Ambulatory Problems    Diagnosis Date Noted  . No Resolved Ambulatory Problems   Past Medical History  Diagnosis Date  . Heart murmur 1936  . GERD (gastroesophageal reflux disease)   . Osteoarthritis   . Prostate cancer ([place])      Review of Systems  Constitutional: Negative for fever, chills, diaphoresis, activity  change, appetite change, fatigue and unexpected weight change.  HENT: Negative for congestion, sore throat, rhinorrhea, sneezing, trouble swallowing and sinus pressure.  Eyes: Negative for photophobia and visual disturbance.  Respiratory: Negative for cough, chest tightness, shortness of breath, wheezing and stridor.  Cardiovascular: Negative for chest pain, palpitations and leg swelling.  Gastrointestinal: Negative for nausea, vomiting, abdominal pain, diarrhea, constipation, blood in stool, abdominal distention and anal bleeding.  Genitourinary: Negative for dysuria, hematuria, flank pain and difficulty urinating.  Musculoskeletal: Negative for myalgias, back pain, joint swelling, arthralgias and gait problem.  Skin: Negative for color change, pallor, rash and wound.  Neurological: Negative for dizziness, tremors, weakness and light-headedness.  Hematological: Negative for adenopathy. Does not bruise/bleed easily.  Psychiatric/Behavioral: Negative for behavioral problems, confusion, sleep disturbance, dysphoric mood, decreased concentration and agitation.       Objective:   Physical Exam     BP 132/80 mmHg  Pulse 78  Temp(Src) 98 F (36.7 C) (Oral)  Ht 6\' 1"  (1.854 m)  Wt 196 lb 8 oz (89.132 kg)  BMI 25.93 kg/m2  Physical Exam  Constitutional: He is oriented to person, place, and time. He appears well-developed and well-nourished. No distress.  HENT:  Mouth/Throat: Oropharynx is clear and moist. No oropharyngeal exudate.  Skin: right lower leg wound bed beefy red. No longer has any exudate Psychiatric: He has a normal mood and affect. His behavior is normal.    CBC Lab Results  Component Value Date   WBC 3.4* 05/18/2015   RBC 2.71* 05/18/2015   HGB 9.4* 07/10/2015   HCT 28.1* 05/18/2015   PLT 182 05/18/2015   MCV 103.7* 05/18/2015   MCH 33.9 05/18/2015   MCHC 32.7 05/18/2015   RDW 15.8* 05/18/2015   LYMPHSABS 1.5 05/18/2015   MONOABS 1.3* 05/18/2015   EOSABS 0.0  05/18/2015   BASOSABS 0.0 05/18/2015   BMET Lab Results  Component Value Date   NA 139 05/18/2015   K 4.1 05/18/2015   CL 103 05/18/2015   CO2 26 05/18/2015   GLUCOSE 100* 05/18/2015   BUN 20 05/18/2015   CREATININE 1.06 05/19/2015   CALCIUM 8.8* 05/18/2015   GFRNONAA >60 05/19/2015   GFRAA >60 05/19/2015        Assessment & Plan:  Will hold off on abtx since it appears now needds to have wound healing. Continue with daily sock dressing and follow up weekly with dr duda  To return to clinic in 3 wk to observe off of abtx

## 2015-08-04 ENCOUNTER — Other Ambulatory Visit (HOSPITAL_COMMUNITY): Payer: Self-pay | Admitting: *Deleted

## 2015-08-07 ENCOUNTER — Encounter (HOSPITAL_COMMUNITY)
Admission: RE | Admit: 2015-08-07 | Discharge: 2015-08-07 | Disposition: A | Discharge status: Home / Self Care | Payer: Medicare Other | Source: Ambulatory Visit | Attending: Oncology | Admitting: Oncology

## 2015-08-07 DIAGNOSIS — D46Z Other myelodysplastic syndromes: Secondary | ICD-10-CM | POA: Diagnosis present

## 2015-08-07 DIAGNOSIS — D4620 Refractory anemia with excess of blasts, unspecified: Secondary | ICD-10-CM

## 2015-08-07 DIAGNOSIS — D72821 Monocytosis (symptomatic): Secondary | ICD-10-CM

## 2015-08-07 DIAGNOSIS — D61818 Other pancytopenia: Secondary | ICD-10-CM

## 2015-08-07 DIAGNOSIS — D619 Aplastic anemia, unspecified: Secondary | ICD-10-CM | POA: Insufficient documentation

## 2015-08-07 LAB — POCT HEMOGLOBIN-HEMACUE: HEMOGLOBIN: 9 g/dL — AB (ref 13.0–17.0)

## 2015-08-07 MED ORDER — DARBEPOETIN ALFA 300 MCG/0.6ML IJ SOSY
300.0000 ug | PREFILLED_SYRINGE | Freq: Once | INTRAMUSCULAR | Status: AC
  Administered 2015-08-07: 300 ug via SUBCUTANEOUS
  Filled 2015-08-07: qty 0.6

## 2015-08-07 MED ORDER — DARBEPOETIN ALFA 150 MCG/0.3ML IJ SOSY
PREFILLED_SYRINGE | INTRAMUSCULAR | Status: DC
Start: 2015-08-07 — End: 2015-08-08
  Filled 2015-08-07: qty 0.6

## 2015-08-07 MED ORDER — CLONIDINE HCL 0.1 MG PO TABS: 0.1000 mg | ORAL_TABLET | Freq: Once | ORAL | Status: DC | PRN

## 2015-08-10 ENCOUNTER — Ambulatory Visit (INDEPENDENT_AMBULATORY_CARE_PROVIDER_SITE_OTHER): Payer: Medicare Other | Admitting: Internal Medicine

## 2015-08-10 ENCOUNTER — Encounter: Payer: Self-pay | Admitting: Internal Medicine

## 2015-08-10 VITALS — BP 168/85 | HR 84 | Temp 97.6°F | Ht 74.0 in | Wt 195.0 lb

## 2015-08-10 DIAGNOSIS — B952 Enterococcus as the cause of diseases classified elsewhere: Secondary | ICD-10-CM

## 2015-08-10 DIAGNOSIS — R630 Anorexia: Secondary | ICD-10-CM

## 2015-08-10 DIAGNOSIS — T798XXS Other early complications of trauma, sequela: Secondary | ICD-10-CM

## 2015-08-10 DIAGNOSIS — D469 Myelodysplastic syndrome, unspecified: Secondary | ICD-10-CM

## 2015-08-10 DIAGNOSIS — A491 Streptococcal infection, unspecified site: Secondary | ICD-10-CM

## 2015-08-10 NOTE — Progress Notes (Signed)
Patient ID: Joel Macdonald, male   DOB: May 31, 1930, 82 y.o.   MRN: [id number]       Patient ID: Joel Macdonald, male   DOB: Nov 11, 1930, 82 y.o.   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 E failed multiple rounds of antibiotics ,and eventually was hospitalized for I x D of affected area. His wound OR cx grew enterococcus. Initially treated with IV ampicillin x 14 d then 10 d course of amoxicillin. At the last visit, he had heavy exudate to wound bed thus extended abtx of amox/clav x 10 days which he finished early April.Marland Kitchen He saw dr duda last week and transitioned to every 2 wk visits. He continues to use sliver/copper compression socks. The wound bed is clean now, beefy red, less bleeding with dressing changes. It measures 10cm long and 8.3cm wide. Shallow. Good beefy red granulation bed. No surrounding erythema. Dry skin thorughout  Cut his toe nail a couple days ago    Outpatient Encounter Prescriptions as of 08/10/2015  Medication Sig  . acetaminophen (TYLENOL) 500 MG tablet Take 500 mg by mouth every 4 (four) hours as needed (pain).  . Aspirin Effervescent (ALKA-SELTZER EXTRA STRENGTH) 500 MG TBEF Take 1,000 mg by mouth every 4 (four) hours as needed (pain/ indigestion/ heartburn).  . clobetasol ointment (TEMOVATE) AB-123456789 % Apply 1 application topically 2 (two) times daily as needed (psoriasis). Reported on 06/26/2015  . fluticasone (FLONASE) 50 MCG/ACT nasal spray Place 1 spray into both nostrils daily as needed (seasonal allergies).  Marland Kitchen ibuprofen (ADVIL,MOTRIN) 200 MG tablet Take 400 mg by mouth every 4 (four) hours as needed for mild pain (pain).   Marland Kitchen levothyroxine (SYNTHROID, LEVOTHROID) 50 MCG tablet Take 50 mcg by mouth daily before breakfast.  . ranitidine (ZANTAC) 150 MG tablet Take 150 mg by mouth daily as needed for heartburn.   . triamcinolone cream (KENALOG) 0.1 % Apply 1 application topically daily as needed  (wound care/ dry skin). Reported on 06/26/2015  . [DISCONTINUED] amoxicillin (AMOXIL) 500 MG tablet Take 1 tablet (500 mg total) by mouth 2 (two) times daily. (Patient not taking: Reported on 06/26/2015)  . [DISCONTINUED] amoxicillin-clavulanate (AUGMENTIN) 875-125 MG tablet Take 1 tablet by mouth 2 (two) times daily.  . [DISCONTINUED] ampicillin 2 g in sodium chloride 0.9 % 50 mL Inject 2 g into the vein every 6 (six) hours. (Patient not taking: Reported on 06/12/2015)  . [DISCONTINUED] collagenase (SANTYL) ointment Apply 1 application topically daily. (Patient not taking: Reported on 08/10/2015)   No facility-administered encounter medications on file as of 08/10/2015.     Patient Active Problem List   Diagnosis Date Noted  . Enterococcus faecalis infection   . Abscess   . Abscess of skin 11/17/2014  . Senile purpura ([place]) 10/18/2014  . MDS (myelodysplastic syndrome), high grade ([place]) 06/13/2014  . Edema, peripheral 03/21/2014  . Monocytosis 03/21/2014  . Peripheral vascular disease, unspecified ([place]) 08/03/2013  . Vasculitis ([place]) 08/02/2013  . Cellulitis of leg, right 07/12/2013  . Ischemia of extremity 07/12/2013  . Cellulitis of leg, left 07/12/2013  . Cellulitis and abscess of leg 07/12/2013  . Compression fracture of T12 vertebra ([place]) 10/08/2011  . MDS (myelodysplastic syndrome), low grade ([place]) 09/20/2011  . Pancytopenia, acquired ([place]) 06/17/2011  . MGUS (monoclonal gammopathy of unknown significance) 06/17/2011  . HIP PAIN, BILATERAL 11/07/2009  . COMPRESSION FRACTURE, THORACIC VERTEBRA 11/07/2009  . ADENOCARCINOMA, PROSTATE, GLEASON GRADE  7 10/17/2009  . BACK PAIN, LUMBAR 10/17/2009  . Sciatica 10/17/2009  . SKIN CANCER 06/05/2006  . HYPERTENSION, BENIGN SYSTEMIC 06/05/2006  . RHINITIS, ALLERGIC 06/05/2006  . REFLUX ESOPHAGITIS 06/05/2006     Health Maintenance Due  Topic Date Due  . TETANUS/TDAP  07/07/1951  . ZOSTAVAX  07/06/1992  . PNA vac Low Risk Adult (2 of 2 -  PPSV23) 05/09/2014     Review of Systems Leg wound. Nasal congestion, mild loss of appetite. Otherwise 10 point ros is negative Physical Exam   BP 168/85 mmHg  Pulse 84  Temp(Src) 97.6 F (36.4 C) (Oral)  Ht 6\' 2"  (1.88 m)  Wt 195 lb (88.451 kg)  BMI 25.03 kg/m2 Physical Exam  Constitutional: He is oriented to person, place, and time. He appears well-developed and well-nourished. No distress.  Neurological: He is alert and oriented to person, place, and time.  Skin: Skin is warm and dry. Has appearance of venous stasis. The wound bed is beefy red good granulation tissue. Less surrounding erythema Psychiatric: He has a normal mood and affect. His behavior is normal.    CBC Lab Results  Component Value Date   WBC 3.4* 05/18/2015   RBC 2.71* 05/18/2015   HGB 9.0* 08/07/2015   HCT 28.1* 05/18/2015   PLT 182 05/18/2015   MCV 103.7* 05/18/2015   MCH 33.9 05/18/2015   MCHC 32.7 05/18/2015   RDW 15.8* 05/18/2015   LYMPHSABS 1.5 05/18/2015   MONOABS 1.3* 05/18/2015   EOSABS 0.0 05/18/2015   BASOSABS 0.0 05/18/2015   BMET Lab Results  Component Value Date   NA 139 05/18/2015   K 4.1 05/18/2015   CL 103 05/18/2015   CO2 26 05/18/2015   GLUCOSE 100* 05/18/2015   BUN 20 05/18/2015   CREATININE 1.06 05/19/2015   CALCIUM 8.8* 05/18/2015   GFRNONAA >60 05/19/2015   GFRAA >60 05/19/2015     Assessment and Plan    Chronic wound = Improving very slowly. Still reasusring. Will not prescribe abtx at this time  Viral illness with nasal congestion/arthralgias/ loss of appetite = appears slowly improving.  Loss of appetite = unclear if just associated with recent viral illness vs longer standing. Recommended to follow weight, measure weekly to see if having associated weight loss

## 2015-08-14 ENCOUNTER — Other Ambulatory Visit (INDEPENDENT_AMBULATORY_CARE_PROVIDER_SITE_OTHER): Payer: Medicare Other

## 2015-08-14 DIAGNOSIS — D46Z Other myelodysplastic syndromes: Secondary | ICD-10-CM

## 2015-08-14 DIAGNOSIS — D4620 Refractory anemia with excess of blasts, unspecified: Secondary | ICD-10-CM

## 2015-08-14 DIAGNOSIS — D61818 Other pancytopenia: Secondary | ICD-10-CM

## 2015-08-14 DIAGNOSIS — L0291 Cutaneous abscess, unspecified: Secondary | ICD-10-CM

## 2015-08-14 LAB — SAVE SMEAR

## 2015-08-15 ENCOUNTER — Telehealth: Payer: Self-pay | Admitting: *Deleted

## 2015-08-15 LAB — COMPREHENSIVE METABOLIC PANEL
A/G RATIO: 1.6 (ref 1.2–2.2)
ALT: 8 IU/L (ref 0–44)
AST: 14 IU/L (ref 0–40)
Albumin: 4.3 g/dL (ref 3.5–4.7)
Alkaline Phosphatase: 81 IU/L (ref 39–117)
BILIRUBIN TOTAL: 0.4 mg/dL (ref 0.0–1.2)
BUN/Creatinine Ratio: 19 (ref 10–24)
BUN: 21 mg/dL (ref 8–27)
CALCIUM: 8.7 mg/dL (ref 8.6–10.2)
CHLORIDE: 99 mmol/L (ref 96–106)
CO2: 20 mmol/L (ref 18–29)
Creatinine, Ser: 1.13 mg/dL (ref 0.76–1.27)
GFR calc Af Amer: 69 mL/min/{1.73_m2} (ref >59)
GFR calc non Af Amer: 60 mL/min/{1.73_m2} (ref >59)
GLUCOSE: 107 mg/dL — AB (ref 65–99)
Globulin, Total: 2.7 g/dL (ref 1.5–4.5)
POTASSIUM: 4.5 mmol/L (ref 3.5–5.2)
Sodium: 137 mmol/L (ref 134–144)
TOTAL PROTEIN: 7 g/dL (ref 6.0–8.5)

## 2015-08-15 LAB — CBC WITH DIFFERENTIAL/PLATELET
BASOS: 0 %
Basophils Absolute: 0 10*3/uL (ref 0.0–0.2)
EOS (ABSOLUTE): 0 10*3/uL (ref 0.0–0.4)
EOS: 0 %
HEMATOCRIT: 28 % — AB (ref 37.5–51.0)
HEMOGLOBIN: 9.2 g/dL — AB (ref 12.6–17.7)
IMMATURE GRANULOCYTES: 2 %
Immature Grans (Abs): 0.1 10*3/uL (ref 0.0–0.1)
LYMPHS ABS: 1.6 10*3/uL (ref 0.7–3.1)
Lymphs: 29 %
MCH: 33.5 pg — ABNORMAL HIGH (ref 26.6–33.0)
MCHC: 32.9 g/dL (ref 31.5–35.7)
MCV: 102 fL — ABNORMAL HIGH (ref 79–97)
MONOS ABS: 1.7 10*3/uL — AB (ref 0.1–0.9)
Monocytes: 32 %
Neutrophils Absolute: 2 10*3/uL (ref 1.4–7.0)
Neutrophils: 37 %
PLATELETS: 235 10*3/uL (ref 150–379)
RBC: 2.75 x10E6/uL — ABNORMAL LOW (ref 4.14–5.80)
RDW: 15.8 % — AB (ref 12.3–15.4)
WBC: 5.4 10*3/uL (ref 3.4–10.8)

## 2015-08-15 LAB — LACTATE DEHYDROGENASE: LDH: 200 IU/L (ref 121–224)

## 2015-08-15 NOTE — Telephone Encounter (Signed)
Pt called / informed "labs stable compared with baseline" per [name]. No questions; stated he will see [place] next Tuesday.

## 2015-08-15 NOTE — Telephone Encounter (Signed)
-----   Message from Annia Belt, MD sent at 08/15/2015 12:31 PM EDT ----- Call pt: lab stable compared with baseline

## 2015-08-22 ENCOUNTER — Ambulatory Visit (INDEPENDENT_AMBULATORY_CARE_PROVIDER_SITE_OTHER): Payer: Medicare Other | Admitting: Oncology

## 2015-08-22 ENCOUNTER — Encounter: Payer: Self-pay | Admitting: Oncology

## 2015-08-22 VITALS — BP 159/78 | HR 82 | Temp 98.6°F | Ht 73.0 in | Wt 197.2 lb

## 2015-08-22 DIAGNOSIS — D61818 Other pancytopenia: Secondary | ICD-10-CM

## 2015-08-22 DIAGNOSIS — D539 Nutritional anemia, unspecified: Secondary | ICD-10-CM

## 2015-08-22 DIAGNOSIS — R600 Localized edema: Secondary | ICD-10-CM

## 2015-08-22 DIAGNOSIS — D72821 Monocytosis (symptomatic): Secondary | ICD-10-CM

## 2015-08-22 DIAGNOSIS — Z9079 Acquired absence of other genital organ(s): Secondary | ICD-10-CM

## 2015-08-22 DIAGNOSIS — D4620 Refractory anemia with excess of blasts, unspecified: Secondary | ICD-10-CM | POA: Diagnosis not present

## 2015-08-22 DIAGNOSIS — D472 Monoclonal gammopathy: Secondary | ICD-10-CM | POA: Diagnosis not present

## 2015-08-22 DIAGNOSIS — Z8546 Personal history of malignant neoplasm of prostate: Secondary | ICD-10-CM

## 2015-08-22 DIAGNOSIS — D46Z Other myelodysplastic syndromes: Secondary | ICD-10-CM

## 2015-08-22 DIAGNOSIS — L97219 Non-pressure chronic ulcer of right calf with unspecified severity: Secondary | ICD-10-CM

## 2015-08-22 NOTE — Patient Instructions (Signed)
Continue every 4 week aranesp at [facility] Lab 9/5 CBC, diff, save smear, Cmet, LDH  MD visit 1-2 weeks after lab

## 2015-08-23 ENCOUNTER — Other Ambulatory Visit: Payer: Self-pay | Admitting: Oncology

## 2015-08-23 DIAGNOSIS — D46Z Other myelodysplastic syndromes: Secondary | ICD-10-CM

## 2015-08-23 NOTE — Progress Notes (Signed)
Patient ID: Joel Macdonald, male   DOB: [birth year], 82 y.o.   MRN: [id number] Hematology and Oncology Follow Up Visit  Joel Macdonald [id number] 1931-03-25 82 y.o. 08/23/2015 10:11 AM   Principle Diagnosis: Encounter Diagnoses  Name Primary?  . Pancytopenia, acquired ([place]) Yes  . Monocytosis   . 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 In addition, he has prostate cancer in remission. He initially presented with pancytopenia in February 2013. No excess blasts on that bone marrow and he was followed with observation alone until he developed progressive, transfusion dependent, anemia and monocytosis. Bone marrow aspiration and biopsy repeated on 05/05/2014 now showed excess blasts of 7%. Normal cytogenetics. In view of his age and comorbid conditions, I elected not to put him on a chemotherapy program. He was started on Aranesp subcutaneous every 3 weeks to support his red cells and this has been successful in keeping his hemoglobin in the 9-10 gram range and sparing him from blood transfusions. He has developed a number of other problems summarized in my office note of 02/20/2015. He developed refractory unexplained bilateral peripheral edema with a negative cardiac and renal evaluation. No evidence for blood clots. Secondary to the persistent edema, he developed recurrent episodes of bilateral cellulitis requiring multiple courses of antibiotics. At time of a 11/18/2014 visit, he had developed a superficial abscess on the skin of his left leg which was incised and drained in the office. He Presented to the hospital on 05/12/2015 with a nonhealing ulcer with purulent discharge anterolateral aspect of his right ankle which began to develop just 2 days before Christmas. Has had a number of courses of oral antibiotics with doxycycline and  Keflex. He was referred back to the wound center. He had another incision and drainage procedure and packing was left in the wound. Since there was  no significant improvement, a CT scan of the right ankle was done on February 1 which showed a superficial collection of gas and fluid in the subcutaneous fat suspicious for residual abscess but no involvement of the underlying muscle or bone. He underwent extensive debridement and skin grafting by Dr. Beverely Low due to. Initial antibiotic therapy with vancomycin and Zosyn. A PICC catheter placed for additional home antibiotics to complete a 14 day course.  Interim History:   He continues to be followed by orthopedic surgery for wound care. Graft is slowly healing. No new areas of ulceration. No exudate on my exam today. No areas of necrotic tissue. He denies any fever. He is having more generalized aches and pains but nothing localized. No cardiorespiratory complaints. No GI complaints. No GU complaints. He has had a number of skin lesions removed recently by his dermatologist. His wife did extremely well with her TAVR procedure and was out of the hospital in 2 days. They are contemplating taking a trip to [place] to see one of their grandchildren graduate.  Medications: reviewed  Allergies:  Allergies  Allergen Reactions  . Azithromycin Nausea And Vomiting  . Other Other (See Comments)    Ragweed causes nasal congestion    Review of Systems: See interim history Remaining ROS negative:   Physical Exam: Blood pressure 159/78, pulse 82, temperature 98.6 F (37 C), temperature source Oral, height 6\' 1"  (1.854 m), weight 197 lb 3.2 oz (89.449 kg), SpO2 99 %. Wt Readings from Last 3 Encounters:  08/22/15 197 lb 3.2 oz (  89.449 kg)  08/10/15 195 lb (88.451 kg)  07/11/15 196 lb 8 oz (89.132 kg)     General appearance:  Adequately nourished Caucasian man HENNT: Pharynx no erythema, exudate, mass, or ulcer. No thyromegaly or thyroid nodules Lymph  nodes: No cervical, supraclavicular, or axillary lymphadenopathy Breasts:  Lungs: Clear to auscultation, resonant to percussion throughout Heart: Regular rhythm, no murmur, no gallop, no rub, no click, no edema Abdomen: Soft, nontender, normal bowel sounds, no mass, no organomegaly Extremities: chronic bilateral edema, and venous stasis changes, no calf tenderness. Minimal cyanotic changes of his toes. Resolved cyanosis of fingers. Musculoskeletal: no joint deformities GU:  Vascular: Carotid pulses 2+, no bruits, distal pulses: Dorsalis pedis 1+ symmetric Neurologic: Alert, oriented, PERRLA,cranial nerves grossly normal, motor strength 5 over 5, reflexes 1+ symmetric, upper body coordination normal, gait normal, Skin: No rash;  Ecchymosis on skin of forearms; large 6x6 cm healing ulcer right lateral calf. Ulcer bed clean. Small wound upper back S/P recent excision of skin lesion. Lab Results: CBC W/Diff    Component Value Date/Time   WBC 5.4 08/14/2015 1117   WBC 3.4* 05/18/2015 1658   WBC 3.0* 05/17/2013 1258   RBC 2.75* 08/14/2015 1117   RBC 2.71* 05/18/2015 1658   RBC 3.13* 06/13/2014 1022   RBC 3.01* 05/17/2013 1258   HGB 9.0* 08/07/2015 1234   HGB 10.4* 05/17/2013 1258   HCT 28.0* 08/14/2015 1117   HCT 28.1* 05/18/2015 1658   HCT 31.5* 05/17/2013 1258   PLT 235 08/14/2015 1117   PLT 182 05/18/2015 1658   PLT 84* 05/17/2013 1258   MCV 102* 08/14/2015 1117   MCV 103.7* 05/18/2015 1658   MCV 104.7* 05/17/2013 1258   MCH 33.5* 08/14/2015 1117   MCH 33.9 05/18/2015 1658   MCH 34.6* 05/17/2013 1258   MCHC 32.9 08/14/2015 1117   MCHC 32.7 05/18/2015 1658   MCHC 33.0 05/17/2013 1258   RDW 15.8* 08/14/2015 1117   RDW 15.8* 05/18/2015 1658   RDW 16.0* 05/17/2013 1258   LYMPHSABS 1.6 08/14/2015 1117   LYMPHSABS 1.5 05/18/2015 1658   LYMPHSABS 1.5 05/17/2013 1258   MONOABS 1.3* 05/18/2015 1658   MONOABS 0.8 05/17/2013 1258   EOSABS 0.0 08/14/2015 1117   EOSABS 0.0 05/18/2015  1658   EOSABS 0.0 05/17/2013 1258   BASOSABS 0.0 08/14/2015 1117   BASOSABS 0.0 05/18/2015 1658   BASOSABS 0.0 05/17/2013 1258     Chemistry      Component Value Date/Time   NA 137 08/14/2015 1117   NA 139 05/18/2015 1658   NA 139 11/09/2012 1425   K 4.5 08/14/2015 1117   K 4.2 11/09/2012 1425   CL 99 08/14/2015 1117   CO2 20 08/14/2015 1117   CO2 24 11/09/2012 1425   BUN 21 08/14/2015 1117   BUN 20 05/18/2015 1658   BUN 22.4 11/09/2012 1425   CREATININE 1.13 08/14/2015 1117   CREATININE 0.88 10/17/2014 1101   CREATININE 1.39* 03/23/2014 0730   CREATININE 1.2 11/09/2012 1425      Component Value Date/Time   CALCIUM 8.7 08/14/2015 1117   CALCIUM 9.3 11/09/2012 1425   ALKPHOS 81 08/14/2015 1117   ALKPHOS 62 11/09/2012 1425   AST 14 08/14/2015 1117   AST 20 11/09/2012 1425   ALT 8 08/14/2015 1117   ALT 19 11/09/2012 1425   BILITOT 0.4 08/14/2015 1117   BILITOT 0.5 05/12/2015 1357   BILITOT 0.57 11/09/2012 1425       Radiological Studies: No results found.  Impression:  #1. High risk myelodysplastic syndrome evolving out of a low risk MDS. Currently receiving supportive care only with every 3 weekly Aranesp injections to support his hemoglobin. Platelet count has been consistently normal. He has chronic monocytosis but no signs yet for transformation to acute leukemia.I will continue to monitor blood counts frequently. If leukemic conversion occurs, due to his other comorbid medical conditions and age, he would not be a candidate for intensive antileukemic chemotherapy.  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 Recurrent cutaneous abscesses and bilateral lower extremity cellulitis secondary to #1 complicated by chronic venous stasis. Status post aggressive debridement and skin grafting of a nonhealing ulcer medial right calf. He will continue close follow-up with orthopedic surgery and infectious disease. He is has completed prescribed 14 day course of parenteral  antibiotics.  #4.chronic macrocytic anemia secondary to #1  #5.IgM monoclonal gammopathy of undetermined significance  #6. History of prostate cancer treated with prostatectomy.  #7. Status post excision squamous cell carcinoma left calf.  #8. Chronic persistent lower extremity edema which does not appear to be cardiac or pulmonary related. Controlled with local elastic stockings.  CC: Patient Care Team: Lajean Manes, MD as PCP - General (Internal Medicine) Annia Belt, MD as Consulting Physician (Oncology)   Annia Belt, MD 5/17/201710:11 AM

## 2015-09-05 ENCOUNTER — Encounter (HOSPITAL_COMMUNITY)
Admission: RE | Admit: 2015-09-05 | Discharge: 2015-09-05 | Disposition: A | Discharge status: Home / Self Care | Payer: Medicare Other | Source: Ambulatory Visit | Attending: Oncology | Admitting: Oncology

## 2015-09-05 VITALS — BP 161/76 | HR 87 | Resp 18

## 2015-09-05 DIAGNOSIS — D46Z Other myelodysplastic syndromes: Secondary | ICD-10-CM

## 2015-09-05 DIAGNOSIS — D61818 Other pancytopenia: Secondary | ICD-10-CM

## 2015-09-05 DIAGNOSIS — D4620 Refractory anemia with excess of blasts, unspecified: Secondary | ICD-10-CM

## 2015-09-05 DIAGNOSIS — D72821 Monocytosis (symptomatic): Secondary | ICD-10-CM

## 2015-09-05 LAB — POCT HEMOGLOBIN-HEMACUE: Hemoglobin: 8.4 g/dL — ABNORMAL LOW (ref 13.0–17.0)

## 2015-09-05 MED ORDER — DARBEPOETIN ALFA 300 MCG/0.6ML IJ SOSY
300.0000 ug | PREFILLED_SYRINGE | Freq: Once | INTRAMUSCULAR | Status: DC
  Filled 2015-09-05: qty 0.6

## 2015-09-05 MED ORDER — CLONIDINE HCL 0.1 MG PO TABS: 0.1000 mg | ORAL_TABLET | Freq: Once | ORAL | Status: DC | PRN

## 2015-09-05 MED ORDER — DARBEPOETIN ALFA 300 MCG/0.6ML IJ SOSY
300.0000 ug | PREFILLED_SYRINGE | INTRAMUSCULAR | Status: DC
  Administered 2015-09-05: 300 ug via SUBCUTANEOUS

## 2015-09-05 MED ORDER — DARBEPOETIN ALFA 150 MCG/0.3ML IJ SOSY
PREFILLED_SYRINGE | INTRAMUSCULAR | Status: AC
  Filled 2015-09-05: qty 0.6

## 2015-09-18 MED FILL — Darbepoetin Alfa Soln Prefilled Syringe 300 MCG/0.6ML: INTRAMUSCULAR | Qty: 0.6 | Status: AC

## 2015-09-28 ENCOUNTER — Other Ambulatory Visit: Payer: Self-pay | Admitting: Oncology

## 2015-09-28 DIAGNOSIS — D46Z Other myelodysplastic syndromes: Secondary | ICD-10-CM

## 2015-10-02 ENCOUNTER — Encounter (HOSPITAL_COMMUNITY)
Admission: RE | Admit: 2015-10-02 | Discharge: 2015-10-02 | Disposition: A | Discharge status: Home / Self Care | Payer: Medicare Other | Source: Ambulatory Visit | Attending: Oncology | Admitting: Oncology

## 2015-10-02 DIAGNOSIS — D61818 Other pancytopenia: Secondary | ICD-10-CM

## 2015-10-02 DIAGNOSIS — D4620 Refractory anemia with excess of blasts, unspecified: Secondary | ICD-10-CM

## 2015-10-02 DIAGNOSIS — D72821 Monocytosis (symptomatic): Secondary | ICD-10-CM | POA: Insufficient documentation

## 2015-10-02 DIAGNOSIS — D46Z Other myelodysplastic syndromes: Secondary | ICD-10-CM | POA: Insufficient documentation

## 2015-10-02 DIAGNOSIS — D619 Aplastic anemia, unspecified: Secondary | ICD-10-CM | POA: Diagnosis present

## 2015-10-02 LAB — POCT HEMOGLOBIN-HEMACUE: HEMOGLOBIN: 8.9 g/dL — AB (ref 13.0–17.0)

## 2015-10-02 MED ORDER — DARBEPOETIN ALFA 100 MCG/0.5ML IJ SOSY
PREFILLED_SYRINGE | INTRAMUSCULAR | Status: AC
  Administered 2015-10-02: 100 ug via SUBCUTANEOUS
  Filled 2015-10-02: qty 0.5

## 2015-10-02 MED ORDER — CLONIDINE HCL 0.1 MG PO TABS: 0.1000 mg | ORAL_TABLET | Freq: Once | ORAL | Status: DC | PRN

## 2015-10-02 MED ORDER — DARBEPOETIN ALFA 150 MCG/0.3ML IJ SOSY
PREFILLED_SYRINGE | INTRAMUSCULAR | Status: AC
  Filled 2015-10-02: qty 0.3

## 2015-10-02 MED ORDER — DARBEPOETIN ALFA 200 MCG/0.4ML IJ SOSY
PREFILLED_SYRINGE | INTRAMUSCULAR | Status: AC
  Administered 2015-10-02: 200 ug
  Filled 2015-10-02: qty 0.4

## 2015-10-02 MED ORDER — DARBEPOETIN ALFA 300 MCG/0.6ML IJ SOSY
300.0000 ug | PREFILLED_SYRINGE | Freq: Once | INTRAMUSCULAR | Status: DC
  Filled 2015-10-02: qty 0.6

## 2015-10-24 ENCOUNTER — Telehealth: Payer: Self-pay | Admitting: *Deleted

## 2015-10-24 NOTE — Telephone Encounter (Signed)
Pt called - Aranesp appt scheduled at [facility] Monday 7/24 @ 1330 PM. Wants to know if Aranesp inj every 3 or 4 weeks? Thanks

## 2015-10-26 ENCOUNTER — Other Ambulatory Visit: Payer: Self-pay | Admitting: Oncology

## 2015-10-26 NOTE — Telephone Encounter (Signed)
Every 3 weeks

## 2015-10-27 NOTE — Telephone Encounter (Signed)
Pt called and informed every 3 weeks for Aranesp inj per [name].

## 2015-10-30 ENCOUNTER — Encounter (HOSPITAL_COMMUNITY)
Admission: RE | Admit: 2015-10-30 | Discharge: 2015-10-30 | Disposition: A | Discharge status: Home / Self Care | Payer: Medicare Other | Source: Ambulatory Visit | Attending: Oncology | Admitting: Oncology

## 2015-10-30 DIAGNOSIS — D46Z Other myelodysplastic syndromes: Secondary | ICD-10-CM | POA: Insufficient documentation

## 2015-10-30 DIAGNOSIS — D61818 Other pancytopenia: Secondary | ICD-10-CM

## 2015-10-30 DIAGNOSIS — D619 Aplastic anemia, unspecified: Secondary | ICD-10-CM | POA: Insufficient documentation

## 2015-10-30 DIAGNOSIS — D72821 Monocytosis (symptomatic): Secondary | ICD-10-CM

## 2015-10-30 DIAGNOSIS — D4620 Refractory anemia with excess of blasts, unspecified: Secondary | ICD-10-CM

## 2015-10-30 LAB — POCT HEMOGLOBIN-HEMACUE: Hemoglobin: 8.2 g/dL — ABNORMAL LOW (ref 13.0–17.0)

## 2015-10-30 MED ORDER — CLONIDINE HCL 0.1 MG PO TABS: 0.1000 mg | ORAL_TABLET | Freq: Once | ORAL | Status: DC | PRN

## 2015-10-30 MED ORDER — DARBEPOETIN ALFA 300 MCG/0.6ML IJ SOSY
300.0000 ug | PREFILLED_SYRINGE | Freq: Once | INTRAMUSCULAR | Status: AC
  Administered 2015-10-30: 300 ug via SUBCUTANEOUS
  Filled 2015-10-30: qty 0.6

## 2015-10-31 ENCOUNTER — Telehealth: Payer: Self-pay | Admitting: *Deleted

## 2015-10-31 NOTE — Telephone Encounter (Signed)
-----   Message from Annia Belt, MD sent at 10/30/2015  5:59 PM EDT ----- Call pt: Hb running lower than usual at 8.2. Let's see if getting Aranesp every 3 weeks helps.

## 2015-10-31 NOTE — Telephone Encounter (Signed)
Pt called / informed Hgb 8.2 and see if getting Aranesp every 3 weeks will help per [name] ; stated he had seen it on My Chart and his next Aranesp has been scheduled.

## 2015-11-20 ENCOUNTER — Encounter (HOSPITAL_COMMUNITY)
Admission: RE | Admit: 2015-11-20 | Discharge: 2015-11-20 | Disposition: A | Discharge status: Home / Self Care | Payer: Medicare Other | Source: Ambulatory Visit | Attending: Oncology | Admitting: Oncology

## 2015-11-20 DIAGNOSIS — D72821 Monocytosis (symptomatic): Secondary | ICD-10-CM | POA: Diagnosis present

## 2015-11-20 DIAGNOSIS — D619 Aplastic anemia, unspecified: Secondary | ICD-10-CM | POA: Insufficient documentation

## 2015-11-20 DIAGNOSIS — D46Z Other myelodysplastic syndromes: Secondary | ICD-10-CM | POA: Diagnosis present

## 2015-11-20 LAB — POCT HEMOGLOBIN-HEMACUE: Hemoglobin: 8.5 g/dL — ABNORMAL LOW (ref 13.0–17.0)

## 2015-11-20 MED ORDER — DARBEPOETIN ALFA 300 MCG/0.6ML IJ SOSY
300.0000 ug | PREFILLED_SYRINGE | INTRAMUSCULAR | Status: DC
  Filled 2015-11-20: qty 0.6

## 2015-11-20 MED ORDER — DARBEPOETIN ALFA 100 MCG/0.5ML IJ SOSY
PREFILLED_SYRINGE | INTRAMUSCULAR | Status: AC
  Administered 2015-11-20: 100 ug via SUBCUTANEOUS
  Filled 2015-11-20: qty 0.5

## 2015-11-20 MED ORDER — DARBEPOETIN ALFA 200 MCG/0.4ML IJ SOSY
PREFILLED_SYRINGE | INTRAMUSCULAR | Status: AC
  Administered 2015-11-20: 200 ug via SUBCUTANEOUS
  Filled 2015-11-20: qty 0.4

## 2015-11-28 ENCOUNTER — Other Ambulatory Visit (HOSPITAL_COMMUNITY): Payer: Self-pay | Admitting: Family

## 2015-12-05 ENCOUNTER — Encounter (HOSPITAL_COMMUNITY): Payer: Self-pay

## 2015-12-05 ENCOUNTER — Encounter (HOSPITAL_COMMUNITY)
Admission: RE | Admit: 2015-12-05 | Discharge: 2015-12-05 | Disposition: A | Discharge status: Home / Self Care | Payer: Medicare Other | Source: Ambulatory Visit | Attending: Orthopedic Surgery | Admitting: Orthopedic Surgery

## 2015-12-05 DIAGNOSIS — Z8546 Personal history of malignant neoplasm of prostate: Secondary | ICD-10-CM | POA: Diagnosis not present

## 2015-12-05 DIAGNOSIS — E039 Hypothyroidism, unspecified: Secondary | ICD-10-CM | POA: Diagnosis not present

## 2015-12-05 DIAGNOSIS — I878 Other specified disorders of veins: Secondary | ICD-10-CM | POA: Diagnosis not present

## 2015-12-05 DIAGNOSIS — K219 Gastro-esophageal reflux disease without esophagitis: Secondary | ICD-10-CM | POA: Diagnosis not present

## 2015-12-05 DIAGNOSIS — M1990 Unspecified osteoarthritis, unspecified site: Secondary | ICD-10-CM | POA: Diagnosis not present

## 2015-12-05 DIAGNOSIS — I10 Essential (primary) hypertension: Secondary | ICD-10-CM | POA: Diagnosis not present

## 2015-12-05 DIAGNOSIS — Z87891 Personal history of nicotine dependence: Secondary | ICD-10-CM | POA: Diagnosis not present

## 2015-12-05 DIAGNOSIS — Z79899 Other long term (current) drug therapy: Secondary | ICD-10-CM | POA: Diagnosis not present

## 2015-12-05 DIAGNOSIS — L97919 Non-pressure chronic ulcer of unspecified part of right lower leg with unspecified severity: Secondary | ICD-10-CM | POA: Diagnosis not present

## 2015-12-05 DIAGNOSIS — Z7982 Long term (current) use of aspirin: Secondary | ICD-10-CM | POA: Diagnosis not present

## 2015-12-05 HISTORY — DX: Unilateral inguinal hernia, without obstruction or gangrene, not specified as recurrent: K40.90

## 2015-12-05 HISTORY — DX: Hypothyroidism, unspecified: E03.9

## 2015-12-05 HISTORY — DX: Lymphedema, not elsewhere classified: I89.0

## 2015-12-05 HISTORY — DX: Personal history of other medical treatment: Z92.89

## 2015-12-05 LAB — CBC
HCT: 28 % — ABNORMAL LOW (ref 39.0–52.0)
HEMOGLOBIN: 8.7 g/dL — AB (ref 13.0–17.0)
MCH: 32.8 pg (ref 26.0–34.0)
MCHC: 31.1 g/dL (ref 30.0–36.0)
MCV: 105.7 fL — ABNORMAL HIGH (ref 78.0–100.0)
PLATELETS: 159 10*3/uL (ref 150–400)
RBC: 2.65 MIL/uL — AB (ref 4.22–5.81)
RDW: 15.8 % — ABNORMAL HIGH (ref 11.5–15.5)
WBC: 4.5 10*3/uL (ref 4.0–10.5)

## 2015-12-05 LAB — BASIC METABOLIC PANEL
ANION GAP: 7 (ref 5–15)
BUN: 15 mg/dL (ref 6–20)
CALCIUM: 8.9 mg/dL (ref 8.9–10.3)
CO2: 22 mmol/L (ref 22–32)
CREATININE: 0.96 mg/dL (ref 0.61–1.24)
Chloride: 108 mmol/L (ref 101–111)
Glucose, Bld: 96 mg/dL (ref 65–99)
Potassium: 4.2 mmol/L (ref 3.5–5.1)
SODIUM: 137 mmol/L (ref 135–145)

## 2015-12-05 NOTE — Progress Notes (Signed)
Pt. 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 Pt. Had a heart murmur as a toddler that resolved after having a tonsillectomy.

## 2015-12-05 NOTE — Pre-Procedure Instructions (Signed)
Joel Macdonald  12/05/2015      [facility] - [place], [place] - [address] [place] [address] Phone: [phone] Fax: [phone]    Your procedure is scheduled on 12/06/2015  Report to [facility] Admitting at 6:30 A.M.  Call this number if you have problems the morning of surgery:  [phone]   Remember:  Do not eat food or drink liquids after midnight.  Take these medicines the morning of surgery with A SIP OF WATER : Tylenol if needed, take thyroid    Do not wear jewelry, make-up or nail polish.  Do not wear lotions, powders, or perfumes, or deoderant.  Do not shave 48 hours prior to surgery.  Men may shave face and neck.  Do not bring valuables to the hospital.  [facility] is not responsible for any belongings or valuables.  Contacts, dentures or bridgework may not be worn into surgery.  Leave your suitcase in the car.  After surgery it may be brought to your room.  For patients admitted to the hospital, discharge time will be determined by your treatment team.  Patients discharged the day of surgery will not be allowed to drive home.   Name and phone number of your driver:   family Special instructions:  Special Instructions: [place] - Preparing for Surgery  Before surgery, you can play an important role.  Because skin is not sterile, your skin needs to be as free of germs as possible.  You can reduce the number of germs on you skin by washing with CHG (chlorahexidine gluconate) soap before surgery.  CHG is an antiseptic cleaner which kills germs and bonds with the skin to continue killing germs even after washing.  Please DO NOT use if you have an allergy to CHG or antibacterial soaps.  If your skin becomes reddened/irritated stop using the CHG and inform your nurse when you arrive at Short Stay.  Do not shave (including legs and underarms) for at least 48 hours prior to the first CHG shower.  You  may shave your face.  Please follow these instructions carefully:   1.  Shower with CHG Soap the night before surgery and the  morning of Surgery.  2.  If you choose to wash your hair, wash your hair first as usual with your  normal shampoo.  3.  After you shampoo, rinse your hair and body thoroughly to remove the  Shampoo.  4.  Use CHG as you would any other liquid soap.  You can apply chg directly to the skin and wash gently with scrungie or a clean washcloth.  5.  Apply the CHG Soap to your body ONLY FROM THE NECK DOWN.    Do not use on open wounds or open sores.  Avoid contact with your eyes, ears, mouth and genitals (private parts).  Wash genitals (private parts)   with your normal soap.  6.  Wash thoroughly, paying special attention to the area where your surgery will be performed.  7.  Thoroughly rinse your body with warm water from the neck down.  8.  DO NOT shower/wash with your normal soap after using and rinsing off   the CHG Soap.  9.  Pat yourself dry with a clean towel.            10.  Wear clean pajamas.            11.  Place clean sheets on your bed the night of your first shower and do not sleep with pets.  Day of Surgery  Do not apply any lotions/deodorants the morning of surgery.  Please wear clean clothes to the hospital/surgery center.  Please read over the following fact sheets that you were given. Pain Booklet and Surgical Site Infection Prevention

## 2015-12-06 ENCOUNTER — Ambulatory Visit (HOSPITAL_COMMUNITY): Payer: Medicare Other | Admitting: Anesthesiology

## 2015-12-06 ENCOUNTER — Encounter (HOSPITAL_COMMUNITY): Payer: Self-pay | Admitting: *Deleted

## 2015-12-06 ENCOUNTER — Encounter (HOSPITAL_COMMUNITY)
Admission: RE | Disposition: A | Discharge status: Home / Self Care | Payer: Self-pay | Source: Ambulatory Visit | Attending: Orthopedic Surgery

## 2015-12-06 ENCOUNTER — Ambulatory Visit (HOSPITAL_COMMUNITY)
Admission: RE | Admit: 2015-12-06 | Discharge: 2015-12-06 | Disposition: A | Discharge status: Home / Self Care | Payer: Medicare Other | Source: Ambulatory Visit | Attending: Orthopedic Surgery | Admitting: Orthopedic Surgery

## 2015-12-06 DIAGNOSIS — Z7982 Long term (current) use of aspirin: Secondary | ICD-10-CM | POA: Insufficient documentation

## 2015-12-06 DIAGNOSIS — E039 Hypothyroidism, unspecified: Secondary | ICD-10-CM | POA: Diagnosis not present

## 2015-12-06 DIAGNOSIS — M1990 Unspecified osteoarthritis, unspecified site: Secondary | ICD-10-CM | POA: Insufficient documentation

## 2015-12-06 DIAGNOSIS — I10 Essential (primary) hypertension: Secondary | ICD-10-CM | POA: Insufficient documentation

## 2015-12-06 DIAGNOSIS — I878 Other specified disorders of veins: Secondary | ICD-10-CM | POA: Insufficient documentation

## 2015-12-06 DIAGNOSIS — Z79899 Other long term (current) drug therapy: Secondary | ICD-10-CM | POA: Insufficient documentation

## 2015-12-06 DIAGNOSIS — L97919 Non-pressure chronic ulcer of unspecified part of right lower leg with unspecified severity: Secondary | ICD-10-CM | POA: Diagnosis not present

## 2015-12-06 DIAGNOSIS — L03119 Cellulitis of unspecified part of limb: Secondary | ICD-10-CM

## 2015-12-06 DIAGNOSIS — L02419 Cutaneous abscess of limb, unspecified: Secondary | ICD-10-CM

## 2015-12-06 DIAGNOSIS — Z87891 Personal history of nicotine dependence: Secondary | ICD-10-CM | POA: Insufficient documentation

## 2015-12-06 DIAGNOSIS — K219 Gastro-esophageal reflux disease without esophagitis: Secondary | ICD-10-CM | POA: Insufficient documentation

## 2015-12-06 DIAGNOSIS — Z8546 Personal history of malignant neoplasm of prostate: Secondary | ICD-10-CM | POA: Insufficient documentation

## 2015-12-06 HISTORY — PX: SKIN SPLIT GRAFT: SHX444

## 2015-12-06 SURGERY — APPLICATION, GRAFT, SKIN, SPLIT-THICKNESS
Anesthesia: Choice | Laterality: Right

## 2015-12-06 MED ORDER — MIDAZOLAM HCL 2 MG/2ML IJ SOLN
INTRAMUSCULAR | Status: AC
  Filled 2015-12-06: qty 2

## 2015-12-06 MED ORDER — MIDAZOLAM HCL 5 MG/5ML IJ SOLN
INTRAMUSCULAR | Status: DC | PRN
  Administered 2015-12-06: 2 mg via INTRAVENOUS

## 2015-12-06 MED ORDER — DEXTROSE 5 % IV SOLN
INTRAVENOUS | Status: DC | PRN
Start: 2015-12-06 — End: 2015-12-06
  Administered 2015-12-06: 08:00:00 via INTRAVENOUS

## 2015-12-06 MED ORDER — ONDANSETRON HCL 4 MG/2ML IJ SOLN
INTRAMUSCULAR | Status: DC | PRN
  Administered 2015-12-06: 4 mg via INTRAVENOUS

## 2015-12-06 MED ORDER — LACTATED RINGERS IV SOLN
INTRAVENOUS | Status: DC | PRN
  Administered 2015-12-06: 08:00:00 via INTRAVENOUS

## 2015-12-06 MED ORDER — MINERAL OIL LIGHT 100 % EX OIL
TOPICAL_OIL | CUTANEOUS | Status: AC
  Filled 2015-12-06: qty 25

## 2015-12-06 MED ORDER — FENTANYL CITRATE (PF) 100 MCG/2ML IJ SOLN
INTRAMUSCULAR | Status: AC
  Filled 2015-12-06: qty 2

## 2015-12-06 MED ORDER — 0.9 % SODIUM CHLORIDE (POUR BTL) OPTIME
TOPICAL | Status: DC | PRN
  Administered 2015-12-06 (×2): 1000 mL

## 2015-12-06 MED ORDER — DEXAMETHASONE SODIUM PHOSPHATE 10 MG/ML IJ SOLN
INTRAMUSCULAR | Status: DC | PRN
  Administered 2015-12-06: 10 mg via INTRAVENOUS

## 2015-12-06 MED ORDER — CEFAZOLIN SODIUM-DEXTROSE 2-4 GM/100ML-% IV SOLN
2.0000 g | INTRAVENOUS | Status: AC
  Administered 2015-12-06: 2 g via INTRAVENOUS
  Filled 2015-12-06: qty 100

## 2015-12-06 MED ORDER — FENTANYL CITRATE (PF) 100 MCG/2ML IJ SOLN
INTRAMUSCULAR | Status: DC | PRN
  Administered 2015-12-06: 100 ug via INTRAVENOUS

## 2015-12-06 MED ORDER — MUPIROCIN CALCIUM 2 % EX CREA
TOPICAL_CREAM | CUTANEOUS | Status: AC
  Filled 2015-12-06: qty 15

## 2015-12-06 MED ORDER — CHLORHEXIDINE GLUCONATE 4 % EX LIQD: 60.0000 mL | Freq: Once | CUTANEOUS | Status: DC

## 2015-12-06 MED ORDER — PROPOFOL 10 MG/ML IV BOLUS
INTRAVENOUS | Status: AC
  Filled 2015-12-06: qty 20

## 2015-12-06 MED ORDER — LIDOCAINE HCL (CARDIAC) 20 MG/ML IV SOLN
INTRAVENOUS | Status: DC | PRN
  Administered 2015-12-06: 60 mg via INTRAVENOUS

## 2015-12-06 MED ORDER — PROPOFOL 10 MG/ML IV BOLUS
INTRAVENOUS | Status: DC | PRN
  Administered 2015-12-06: 140 mg via INTRAVENOUS

## 2015-12-06 SURGICAL SUPPLY — 45 items
BNDG CMPR 9X4 STRL LF SNTH (GAUZE/BANDAGES/DRESSINGS)
BNDG COHESIVE 6X5 TAN STRL LF (GAUZE/BANDAGES/DRESSINGS) IMPLANT
BNDG ESMARK 4X9 LF (GAUZE/BANDAGES/DRESSINGS) ×1 IMPLANT
BNDG GAUZE STRTCH 6 (GAUZE/BANDAGES/DRESSINGS) IMPLANT
COVER SURGICAL LIGHT HANDLE (MISCELLANEOUS) ×6 IMPLANT
CUFF TOURNIQUET SINGLE 18IN (TOURNIQUET CUFF) IMPLANT
CUFF TOURNIQUET SINGLE 24IN (TOURNIQUET CUFF) IMPLANT
DERMACARRIERS GRAFT 1 TO 1.5 (DISPOSABLE)
DRAPE U-SHAPE 47X51 STRL (DRAPES) ×3 IMPLANT
DRSG ADAPTIC 3X8 NADH LF (GAUZE/BANDAGES/DRESSINGS) IMPLANT
DRSG MEPITEL 4X7.2 (GAUZE/BANDAGES/DRESSINGS) ×3 IMPLANT
DRSG VAC ATS MED SENSATRAC (GAUZE/BANDAGES/DRESSINGS) ×2 IMPLANT
DURAPREP 26ML APPLICATOR (WOUND CARE) ×3 IMPLANT
ELECT REM PT RETURN 9FT ADLT (ELECTROSURGICAL) ×3
ELECTRODE REM PT RTRN 9FT ADLT (ELECTROSURGICAL) ×1 IMPLANT
GAUZE SPONGE 4X4 12PLY STRL (GAUZE/BANDAGES/DRESSINGS) IMPLANT
GLOVE BIOGEL PI IND STRL 9 (GLOVE) ×1 IMPLANT
GLOVE BIOGEL PI INDICATOR 9 (GLOVE) ×2
GLOVE SURG ORTHO 9.0 STRL STRW (GLOVE) ×3 IMPLANT
GOWN STRL REUS W/ TWL XL LVL3 (GOWN DISPOSABLE) ×2 IMPLANT
GOWN STRL REUS W/TWL XL LVL3 (GOWN DISPOSABLE) ×6
GRAFT DERMACARRIERS 1 TO 1.5 (DISPOSABLE) IMPLANT
GRAFT TISS THERASKIN 2X3 (Tissue) IMPLANT
KIT BASIN OR (CUSTOM PROCEDURE TRAY) ×3 IMPLANT
KIT PREVENA INCISION MGT 13 (CANNISTER) ×2 IMPLANT
KIT ROOM TURNOVER OR (KITS) ×3 IMPLANT
MANIFOLD NEPTUNE II (INSTRUMENTS) ×1 IMPLANT
NDL HYPO 25GX1X1/2 BEV (NEEDLE) IMPLANT
NEEDLE HYPO 25GX1X1/2 BEV (NEEDLE) IMPLANT
NS IRRIG 1000ML POUR BTL (IV SOLUTION) ×5 IMPLANT
PACK ORTHO EXTREMITY (CUSTOM PROCEDURE TRAY) ×3 IMPLANT
PAD ARMBOARD 7.5X6 YLW CONV (MISCELLANEOUS) ×6 IMPLANT
PAD CAST 4YDX4 CTTN HI CHSV (CAST SUPPLIES) IMPLANT
PADDING CAST COTTON 4X4 STRL (CAST SUPPLIES)
SUCTION FRAZIER HANDLE 10FR (MISCELLANEOUS)
SUCTION TUBE FRAZIER 10FR DISP (MISCELLANEOUS) IMPLANT
SUT ETHILON 2 0 PSLX (SUTURE) ×3 IMPLANT
SUT ETHILON 4 0 PS 2 18 (SUTURE) IMPLANT
SYR CONTROL 10ML LL (SYRINGE) IMPLANT
TISSUE THERASKIN 2X3 (Tissue) ×6 IMPLANT
TOWEL OR 17X24 6PK STRL BLUE (TOWEL DISPOSABLE) ×3 IMPLANT
TOWEL OR 17X26 10 PK STRL BLUE (TOWEL DISPOSABLE) ×3 IMPLANT
TUBE CONNECTING 12'X1/4 (SUCTIONS)
TUBE CONNECTING 12X1/4 (SUCTIONS) IMPLANT
WATER STERILE IRR 1000ML POUR (IV SOLUTION) ×1 IMPLANT

## 2015-12-06 NOTE — H&P (Signed)
Joel Macdonald is an 82 y.o. male.   Chief Complaint: Chronic wound right leg HPI: Patient is status post irrigation and debridement and skin grafting for a massive venous stasis ulcer right leg. Patient has undergone conservative therapy and still has a persistent wound. The wound bed has completely filled and there is no depth to the wound and patient is having slow healing he presents at this time for split thickness skin graft.  Past Medical History:  Diagnosis Date  . Abscess of skin 11/17/2014   Left ankle: incise & drain 11/17/14  . Cellulitis and abscess of leg 05/12/2015   rt leg  . Cellulitis of leg, right 07/12/2013  . Edema, peripheral 03/21/2014  . GERD (gastroesophageal reflux disease)   . Heart murmur 1936  . History of blood transfusion    last episode- 04/2015  . Hypothyroidism   . Inguinal hernia    reducible - on R side   . Ischemia of extremity 07/12/2013   Bilateral toes  2,3,4 right; 1,3 left  Vascular doppler normal for large vessel occlusion 2/15  . Lymphedema    R leg- in the past   . MDS (myelodysplastic syndrome), high grade ([place]) 06/13/2014   7% blasts BM bx 05/05/14  . MDS (myelodysplastic syndrome), low grade ([place]) 09/20/2011   Bone marrow bx 06/13/11: mild dyserythro/dymegakaryopoiesis.  Normal cytogenetics.  FISH negative for chrom 5 or 7 deletions.  Scattered small infiltrates of plasmacytoid lymphs  . MGUS (monoclonal gammopathy of unknown significance) 06/17/2011  . Monocytosis 03/21/2014  . Osteoarthritis    "back, legs, arms, shoulders" (07/12/2013)  . Pancytopenia, acquired ([place]) 06/17/2011   "from my Myelodysplastic syndrome"  . Prostate cancer ([place])   . Senile purpura ([place]) 10/18/2014    Past Surgical History:  Procedure Laterality Date  . APPLICATION OF WOUND VAC Right 05/17/2015   Procedure: VAC placement;  Surgeon: Newt Minion, MD;  Location: [facility];  Service: Orthopedics;  Laterality: Right;  . CATARACT EXTRACTION W/ INTRAOCULAR LENS  IMPLANT,  BILATERAL Bilateral ~ 2012  . I&D EXTREMITY Right 05/13/2015   Procedure: IRRIGATION AND DEBRIDEMENT ANKLE;  Surgeon: Newt Minion, MD;  Location: [facility];  Service: Orthopedics;  Laterality: Right;  . I&D EXTREMITY Right 05/17/2015   Procedure: IRRIGATION AND DEBRIDEMENT RIGHT LEG, APPLY SKIN GRAFT ;  Surgeon: Newt Minion, MD;  Location: [facility];  Service: Orthopedics;  Laterality: Right;  . ROBOT ASSISTED LAPAROSCOPIC RADICAL PROSTATECTOMY  ~ 2010  . TONSILLECTOMY AND ADENOIDECTOMY  ~ 1939    Family History  Problem Relation Age of Onset  . Other Mother   . Other Father   . Heart disease Father   . Hyperlipidemia Son    Social History:  reports that he quit smoking about 22 years ago. His smoking use included Pipe. He quit after 40.00 years of use. He has never used smokeless tobacco. He reports that he drinks alcohol. He reports that he does not use drugs.  Allergies:  Allergies  Allergen Reactions  . Other Other (See Comments)    Ragweed causes nasal congestion    Medications Prior to Admission  Medication Sig Dispense Refill  . acetaminophen (TYLENOL) 500 MG tablet Take 1,000 mg by mouth every 6 (six) hours as needed (pain).     Marland Kitchen aspirin 325 MG tablet Take 650 mg by mouth daily as needed for moderate pain.     . Aspirin Effervescent (ALKA-SELTZER EXTRA STRENGTH) 500 MG TBEF Take 1,000 mg by mouth every  4 (four) hours as needed (pain/ indigestion/ heartburn).    . fluticasone (FLONASE) 50 MCG/ACT nasal spray Place 1 spray into both nostrils daily as needed (seasonal allergies).    Marland Kitchen ibuprofen (ADVIL,MOTRIN) 200 MG tablet Take 400 mg by mouth every 6 (six) hours as needed for mild pain (pain).     Marland Kitchen levothyroxine (SYNTHROID, LEVOTHROID) 50 MCG tablet Take 50 mcg by mouth daily before breakfast.    . Multiple Vitamin (MULTIVITAMIN WITH MINERALS) TABS tablet Take 1 tablet by mouth daily.    . ranitidine (ZANTAC) 150 MG tablet Take 150 mg by mouth daily as needed for heartburn.     .  clobetasol ointment (TEMOVATE) 9.67 % Apply 1 application topically 2 (two) times daily as needed (psoriasis). Reported on 06/26/2015    . triamcinolone cream (KENALOG) 0.1 % Apply 1 application topically daily as needed (wound care/ dry skin). Reported on 06/26/2015      Results for orders placed or performed during the hospital encounter of 12/05/15 (from the past 48 hour(s))  Basic metabolic panel     Status: None   Collection Time: 12/05/15  4:10 PM  Result Value Ref Range   Sodium 137 135 - 145 mmol/L   Potassium 4.2 3.5 - 5.1 mmol/L   Chloride 108 101 - 111 mmol/L   CO2 22 22 - 32 mmol/L   Glucose, Bld 96 65 - 99 mg/dL   BUN 15 6 - 20 mg/dL   Creatinine, Ser 0.96 0.61 - 1.24 mg/dL   Calcium 8.9 8.9 - 10.3 mg/dL   GFR calc non Af Amer >60 >60 mL/min   GFR calc Af Amer >60 >60 mL/min    Comment: (NOTE) The eGFR has been calculated using the CKD EPI equation. This calculation has not been validated in all clinical situations. eGFR's persistently <60 mL/min signify possible Chronic Kidney Disease.    Anion gap 7 5 - 15  CBC     Status: Abnormal   Collection Time: 12/05/15  4:10 PM  Result Value Ref Range   WBC 4.5 4.0 - 10.5 K/uL   RBC 2.65 (L) 4.22 - 5.81 MIL/uL   Hemoglobin 8.7 (L) 13.0 - 17.0 g/dL   HCT 28.0 (L) 39.0 - 52.0 %   MCV 105.7 (H) 78.0 - 100.0 fL   MCH 32.8 26.0 - 34.0 pg   MCHC 31.1 30.0 - 36.0 g/dL   RDW 15.8 (H) 11.5 - 15.5 %   Platelets 159 150 - 400 K/uL   No results found.  Review of Systems  All other systems reviewed and are negative.   There were no vitals taken for this visit. Physical Exam  On examination patient has a good beefy granulating wound bed. The wound edges are healing well there is no depth to the wound. Wound is approximately 5 x 10 cm. Assessment/Plan Assessment: Chronic venous stasis ulcer right leg with slow healing.  Plan: We will plan for split-thickness skin graft and application of wound VAC. Risks and benefits were  discussed patient states he understands wish to proceed at this time.  Newt Minion, MD 12/06/2015, 6:56 AM

## 2015-12-06 NOTE — Transfer of Care (Signed)
Immediate Anesthesia Transfer of Care Note  Patient: Joel Macdonald  Procedure(s) Performed: Procedure(s): APPLY SKIN GRAFT SPLIT THICKNESS TO RIGHT LEG (Right)  Patient Location: PACU  Anesthesia Type:General  Level of Consciousness: awake, oriented, sedated, patient cooperative and responds to stimulation  Airway & Oxygen Therapy: Patient Spontanous Breathing and Patient connected to nasal cannula oxygen  Post-op Assessment: Report given to RN, Post -op Vital signs reviewed and stable, Patient moving all extremities and Patient moving all extremities X 4  Post vital signs: Reviewed and stable  Last Vitals:  Vitals:   12/06/15 0705 12/06/15 0913  BP: (!) 167/86 136/88  Pulse: 84 79  Resp: 20 12  Temp: 36.8 C 36.8 C    Last Pain:  Vitals:   12/06/15 0705  TempSrc: Oral         Complications: No apparent anesthesia complications

## 2015-12-06 NOTE — Anesthesia Procedure Notes (Signed)
Procedure Name: LMA Insertion Date/Time: 12/06/2015 8:46 AM Performed by: Jacquiline Doe A Pre-anesthesia Checklist: Patient identified, Emergency Drugs available, Suction available and Patient being monitored Patient Re-evaluated:Patient Re-evaluated prior to inductionOxygen Delivery Method: Circle System Utilized Preoxygenation: Pre-oxygenation with 100% oxygen Intubation Type: IV induction Ventilation: Mask ventilation without difficulty LMA: LMA inserted LMA Size: 4.0 Tube type: Oral Number of attempts: 1 Placement Confirmation: positive ETCO2 Tube secured with: Tape Dental Injury: Teeth and Oropharynx as per pre-operative assessment

## 2015-12-06 NOTE — Anesthesia Postprocedure Evaluation (Signed)
Anesthesia Post Note  Patient: Joel Macdonald  Procedure(s) Performed: Procedure(s) (LRB): APPLY SKIN GRAFT SPLIT THICKNESS TO RIGHT LEG (Right)  Patient location during evaluation: PACU Anesthesia Type: General Level of consciousness: awake Pain management: pain level controlled Vital Signs Assessment: post-procedure vital signs reviewed and stable Respiratory status: spontaneous breathing Cardiovascular status: stable Postop Assessment: no signs of nausea or vomiting Anesthetic complications: no    Last Vitals:  Vitals:   12/06/15 0943 12/06/15 0948  BP:  140/84  Pulse: 68 79  Resp: 14   Temp: 36.8 C     Last Pain:  Vitals:   12/06/15 0943  TempSrc:   PainSc: 0-No pain                 Tanielle Emigh

## 2015-12-06 NOTE — Op Note (Signed)
12/06/2015  9:03 AM  PATIENT:  Joel Macdonald    PRE-OPERATIVE DIAGNOSIS:  Right Leg Wound  POST-OPERATIVE DIAGNOSIS:  Same  PROCEDURE:  APPLY SKIN GRAFT SPLIT THICKNESS TO RIGHT LEG  SURGEON:  Newt Minion, MD  PHYSICIAN ASSISTANT:None ANESTHESIA:   General  PREOPERATIVE INDICATIONS:  Joel Macdonald is a  82 y.o. male with a diagnosis of Right Leg Wound who failed conservative measures and elected for surgical management.    The risks benefits and alternatives were discussed with the patient preoperatively including but not limited to the risks of infection, bleeding, nerve injury, cardiopulmonary complications, the need for revision surgery, among others, and the patient was willing to proceed.  OPERATIVE IMPLANTS: TheraSkin implants 2 each unit 2 x 3"  OPERATIVE FINDINGS: Wound debrided back to healthy viable granulation tissue  OPERATIVE PROCEDURE: Patient brought to operating room and underwent a general anesthetic. After adequate levels anesthesia obtained patient's right lower extremity was prepped using DuraPrep draped into a sterile field a timeout was called. A 10 blade knife was used to debride the fibrinous exudative tissue from the wound. The wound measures 5 x 10 cm after debridement. This is debrided back to healthy viable granulation tissue. There is no depth to the wound there is no tunneling. There is no necrotic tissue. 2 of the TheraSkin 2 x 3" units were applied these were held in place by staples. A Mepitel dressing was applied followed by a Prevena wound VAC. This had a good suction fit patient was extubated taken the PACU in stable condition plan for discharge to home.

## 2015-12-06 NOTE — Anesthesia Preprocedure Evaluation (Signed)
Anesthesia Evaluation  Patient identified by MRN, date of birth, ID band Patient awake    Reviewed: Allergy & Precautions, NPO status , Patient's Chart, lab work & pertinent test results  History of Anesthesia Complications Negative for: history of anesthetic complications  Airway Mallampati: II  TM Distance: >3 FB Neck ROM: Full    Dental  (+) Teeth Intact   Pulmonary neg shortness of breath, neg COPD, neg recent URI, former smoker,    breath sounds clear to auscultation       Cardiovascular hypertension, (-) angina+ Peripheral Vascular Disease  (-) Past MI and (-) CHF  Rhythm:Regular     Neuro/Psych  Neuromuscular disease negative psych ROS   GI/Hepatic Neg liver ROS, GERD  Controlled and Medicated,  Endo/Other  Hypothyroidism   Renal/GU negative Renal ROS     Musculoskeletal  (+) Arthritis ,   Abdominal   Peds  Hematology  (+) anemia ,   Anesthesia Other Findings   Reproductive/Obstetrics                             Anesthesia Physical Anesthesia Plan  ASA: II  Anesthesia Plan: General   Post-op Pain Management:    Induction: Intravenous  Airway Management Planned: LMA  Additional Equipment: None  Intra-op Plan:   Post-operative Plan: Extubation in OR  Informed Consent: I have reviewed the patients History and Physical, chart, labs and discussed the procedure including the risks, benefits and alternatives for the proposed anesthesia with the patient or authorized representative who has indicated his/her understanding and acceptance.   Dental advisory given  Plan Discussed with: CRNA and Surgeon  Anesthesia Plan Comments:         Anesthesia Quick Evaluation

## 2015-12-07 ENCOUNTER — Encounter (HOSPITAL_COMMUNITY): Payer: Self-pay | Admitting: Orthopedic Surgery

## 2015-12-12 ENCOUNTER — Encounter (HOSPITAL_COMMUNITY)
Admission: RE | Admit: 2015-12-12 | Discharge: 2015-12-12 | Disposition: A | Discharge status: Home / Self Care | Payer: Medicare Other | Source: Ambulatory Visit | Attending: Oncology | Admitting: Oncology

## 2015-12-12 DIAGNOSIS — D72821 Monocytosis (symptomatic): Secondary | ICD-10-CM | POA: Diagnosis present

## 2015-12-12 DIAGNOSIS — D61818 Other pancytopenia: Secondary | ICD-10-CM

## 2015-12-12 DIAGNOSIS — D4620 Refractory anemia with excess of blasts, unspecified: Secondary | ICD-10-CM

## 2015-12-12 DIAGNOSIS — D619 Aplastic anemia, unspecified: Secondary | ICD-10-CM | POA: Insufficient documentation

## 2015-12-12 DIAGNOSIS — D46Z Other myelodysplastic syndromes: Secondary | ICD-10-CM | POA: Diagnosis present

## 2015-12-12 LAB — POCT HEMOGLOBIN-HEMACUE: HEMOGLOBIN: 8.7 g/dL — AB (ref 13.0–17.0)

## 2015-12-12 MED ORDER — DARBEPOETIN ALFA 300 MCG/0.6ML IJ SOSY
300.0000 ug | PREFILLED_SYRINGE | Freq: Once | INTRAMUSCULAR | Status: AC
  Administered 2015-12-12: 300 ug via SUBCUTANEOUS
  Filled 2015-12-12: qty 0.6

## 2015-12-12 MED ORDER — DARBEPOETIN ALFA 150 MCG/0.3ML IJ SOSY
PREFILLED_SYRINGE | INTRAMUSCULAR | Status: AC
  Filled 2015-12-12: qty 0.6

## 2015-12-12 MED ORDER — CLONIDINE HCL 0.1 MG PO TABS: 0.1000 mg | ORAL_TABLET | Freq: Once | ORAL | Status: DC | PRN

## 2015-12-19 ENCOUNTER — Telehealth: Payer: Self-pay | Admitting: *Deleted

## 2015-12-19 NOTE — Telephone Encounter (Signed)
-----   Message from Annia Belt, MD sent at 12/15/2015  4:52 PM EDT ----- Call [name]  Hb holding at 8.7

## 2015-12-19 NOTE — Telephone Encounter (Signed)
Pt called / informed Hgb holding at 8.7 per [name]. Stated ok ; had already view results.

## 2015-12-29 ENCOUNTER — Other Ambulatory Visit (INDEPENDENT_AMBULATORY_CARE_PROVIDER_SITE_OTHER): Payer: Medicare Other

## 2015-12-29 DIAGNOSIS — D46Z Other myelodysplastic syndromes: Secondary | ICD-10-CM | POA: Diagnosis not present

## 2015-12-29 DIAGNOSIS — D72821 Monocytosis (symptomatic): Secondary | ICD-10-CM

## 2015-12-29 DIAGNOSIS — D61818 Other pancytopenia: Secondary | ICD-10-CM

## 2015-12-29 LAB — SAVE SMEAR

## 2015-12-30 LAB — CBC WITH DIFFERENTIAL/PLATELET
BASOS ABS: 0 10*3/uL (ref 0.0–0.2)
Basos: 0 %
EOS (ABSOLUTE): 0 10*3/uL (ref 0.0–0.4)
Eos: 1 %
Hematocrit: 26.7 % — ABNORMAL LOW (ref 37.5–51.0)
Hemoglobin: 8.7 g/dL — ABNORMAL LOW (ref 12.6–17.7)
IMMATURE GRANS (ABS): 0 10*3/uL (ref 0.0–0.1)
IMMATURE GRANULOCYTES: 1 %
LYMPHS: 33 %
Lymphocytes Absolute: 1.2 10*3/uL (ref 0.7–3.1)
MCH: 33 pg (ref 26.6–33.0)
MCHC: 32.6 g/dL (ref 31.5–35.7)
MCV: 101 fL — ABNORMAL HIGH (ref 79–97)
Monocytes Absolute: 1.2 10*3/uL — ABNORMAL HIGH (ref 0.1–0.9)
Monocytes: 32 %
NEUTROS PCT: 33 %
Neutrophils Absolute: 1.3 10*3/uL — ABNORMAL LOW (ref 1.4–7.0)
PLATELETS: 161 10*3/uL (ref 150–379)
RBC: 2.64 x10E6/uL — AB (ref 4.14–5.80)
RDW: 16.5 % — ABNORMAL HIGH (ref 12.3–15.4)
WBC: 3.7 10*3/uL (ref 3.4–10.8)

## 2015-12-30 LAB — LACTATE DEHYDROGENASE: LDH: 186 IU/L (ref 121–224)

## 2016-01-02 ENCOUNTER — Encounter (HOSPITAL_COMMUNITY)
Admission: RE | Admit: 2016-01-02 | Discharge: 2016-01-02 | Disposition: A | Discharge status: Home / Self Care | Payer: Medicare Other | Source: Ambulatory Visit | Attending: Oncology | Admitting: Oncology

## 2016-01-02 DIAGNOSIS — D72821 Monocytosis (symptomatic): Secondary | ICD-10-CM

## 2016-01-02 DIAGNOSIS — D61818 Other pancytopenia: Secondary | ICD-10-CM

## 2016-01-02 DIAGNOSIS — D4620 Refractory anemia with excess of blasts, unspecified: Secondary | ICD-10-CM

## 2016-01-02 DIAGNOSIS — D46Z Other myelodysplastic syndromes: Secondary | ICD-10-CM | POA: Diagnosis not present

## 2016-01-02 MED ORDER — DARBEPOETIN ALFA 300 MCG/0.6ML IJ SOSY
300.0000 ug | PREFILLED_SYRINGE | Freq: Once | INTRAMUSCULAR | Status: AC
  Administered 2016-01-02: 300 ug via SUBCUTANEOUS
  Filled 2016-01-02: qty 0.6

## 2016-01-02 MED ORDER — CLONIDINE HCL 0.1 MG PO TABS: 0.1000 mg | ORAL_TABLET | Freq: Once | ORAL | Status: DC | PRN

## 2016-01-02 MED ORDER — DARBEPOETIN ALFA 150 MCG/0.3ML IJ SOSY
PREFILLED_SYRINGE | INTRAMUSCULAR | Status: AC
  Filled 2016-01-02: qty 0.6

## 2016-01-04 ENCOUNTER — Telehealth: Payer: Self-pay | Admitting: *Deleted

## 2016-01-04 NOTE — Telephone Encounter (Signed)
-----   Message from Annia Belt, MD sent at 01/01/2016  7:21 PM EDT ----- Call [phone] holding: Hb 8.7 no change

## 2016-01-04 NOTE — Telephone Encounter (Signed)
Pt called / informed "counts holding: Hb 8.7 no change " per [name]. Stated he had already viewed results; will see [place] next week.

## 2016-01-08 ENCOUNTER — Ambulatory Visit (INDEPENDENT_AMBULATORY_CARE_PROVIDER_SITE_OTHER): Payer: Medicare Other | Admitting: Orthopedic Surgery

## 2016-01-08 DIAGNOSIS — I87312 Chronic venous hypertension (idiopathic) with ulcer of left lower extremity: Secondary | ICD-10-CM

## 2016-01-09 ENCOUNTER — Encounter: Payer: Self-pay | Admitting: Oncology

## 2016-01-09 ENCOUNTER — Ambulatory Visit (INDEPENDENT_AMBULATORY_CARE_PROVIDER_SITE_OTHER): Payer: Medicare Other | Admitting: Oncology

## 2016-01-09 VITALS — BP 166/73 | HR 92 | Temp 97.9°F | Ht 73.0 in | Wt 192.0 lb

## 2016-01-09 DIAGNOSIS — L03116 Cellulitis of left lower limb: Secondary | ICD-10-CM

## 2016-01-09 DIAGNOSIS — D539 Nutritional anemia, unspecified: Secondary | ICD-10-CM

## 2016-01-09 DIAGNOSIS — Z87891 Personal history of nicotine dependence: Secondary | ICD-10-CM

## 2016-01-09 DIAGNOSIS — D61818 Other pancytopenia: Secondary | ICD-10-CM

## 2016-01-09 DIAGNOSIS — R5383 Other fatigue: Secondary | ICD-10-CM | POA: Diagnosis not present

## 2016-01-09 DIAGNOSIS — D46Z Other myelodysplastic syndromes: Secondary | ICD-10-CM | POA: Diagnosis not present

## 2016-01-09 DIAGNOSIS — E039 Hypothyroidism, unspecified: Secondary | ICD-10-CM | POA: Insufficient documentation

## 2016-01-09 DIAGNOSIS — D472 Monoclonal gammopathy: Secondary | ICD-10-CM

## 2016-01-09 DIAGNOSIS — I878 Other specified disorders of veins: Secondary | ICD-10-CM

## 2016-01-09 DIAGNOSIS — Z8546 Personal history of malignant neoplasm of prostate: Secondary | ICD-10-CM

## 2016-01-09 DIAGNOSIS — Z9079 Acquired absence of other genital organ(s): Secondary | ICD-10-CM

## 2016-01-09 DIAGNOSIS — L97219 Non-pressure chronic ulcer of right calf with unspecified severity: Secondary | ICD-10-CM

## 2016-01-09 DIAGNOSIS — L97929 Non-pressure chronic ulcer of unspecified part of left lower leg with unspecified severity: Secondary | ICD-10-CM

## 2016-01-09 DIAGNOSIS — R600 Localized edema: Secondary | ICD-10-CM

## 2016-01-09 DIAGNOSIS — B9689 Other specified bacterial agents as the cause of diseases classified elsewhere: Secondary | ICD-10-CM

## 2016-01-09 DIAGNOSIS — L03115 Cellulitis of right lower limb: Secondary | ICD-10-CM

## 2016-01-09 NOTE — Progress Notes (Addendum)
Hematology and Oncology Follow Up Visit  YASIM DERCK [id number] [birth year] 82 y.o. 01/09/2016 10:40 AM   Principle Diagnosis: Encounter Diagnoses  Name Primary?  . Pancytopenia, acquired ([place])   . MDS (myelodysplastic syndrome), high grade ([place]) Yes  . 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 In addition, he has prostate cancer in remission. He initially presented with pancytopenia in February 2013. No excess blasts on that bone marrow and he was followed with observation alone until he developed progressive, transfusion dependent, anemia and monocytosis. Bone marrow aspiration and biopsy repeated on 05/05/2014 now showed excess blasts of 7%. Normal cytogenetics. In view of his age and comorbid conditions, I elected not to put him on a chemotherapy program. He was started on Aranesp subcutaneous every 3 weeks to support his red cells and this has been successful in keeping his hemoglobin in the 9-10 gram range and sparing him from blood transfusions. He has developed a number of other problems summarized in my office note of 02/20/2015. He developed refractory unexplained bilateral peripheral edema with a negative cardiac and renal evaluation. No evidence for blood clots. Secondary to the persistent edema, he developed recurrent episodes of bilateral cellulitis requiring multiple courses of antibiotics. At time of a 11/18/2014 visit, he had developed a superficial abscess on the skin of his left leg which was incised and drained in the office. He Presented to the hospital on 05/12/2015 with a nonhealing ulcer with purulent discharge anterolateral aspect of his right ankle which began to develop just 2 days before Christmas. Has had a number of courses of oral antibiotics with doxycycline and Keflex. He was referred back  to the wound center. He had another incision and drainage procedure and packing was left in the wound. Since there was  no significant improvement, a CT scan of the right ankle was done on February 1 which showed a superficial collection of gas and fluid in the subcutaneous fat suspicious for residual abscess but no involvement of the underlying muscle or bone. He underwent extensive debridement and skin grafting by Dr. Beverely Low due to. Initial antibiotic therapy with vancomycin and Zosyn. A PICC catheter placed for additional home antibiotics to complete a 14 day course.   Interim History:   He continues have problems with bilateral lower extremity ulceration/abscess. He required a another skin graft to area of previous ulceration which was grafted after extensive debridement. He has another active lesion lower than that. He continues to be followed closely at the wound center. He is on a prolonged course of doxycycline started on September 5 and due to be completed within the next few days. He denies any fevers or chills. He had a minor laser procedure on his left eye last week subsequent to changes from previous cataract surgery.  With respect to his MDS, hemoglobin has fallen but plateaued at 8.7 on every three-week Aranesp currently at 300 g . No other change in his heme profile. Borderline leukocytosis when he is not actively infected. Chronic monocytosis. Platelet count remains normal. He has chronic fatigue but no bleeding. He continues on Synthroid for hypothyroidism.  Medications: reviewed  Allergies:  Allergies  Allergen Reactions  . Other Other (See Comments)    Ragweed causes nasal congestion    Review of Systems: See interim history  Remaining ROS negative:   Physical Exam: Blood pressure (!) 166/73, pulse 92, temperature 97.9 F (  36.6 C), temperature source Oral, height 6\' 1"  (1.854 m), weight 192 lb (87.1 kg), SpO2 100 %. Wt Readings from Last 3 Encounters:  01/09/16 192  lb (87.1 kg)  12/06/15 194 lb (88 kg)  12/05/15 196 lb 1.6 oz (89 kg)     General appearance: Adequately nourished Caucasian man HENNT: Pharynx no erythema, exudate, mass, or ulcer. No thyromegaly or thyroid nodules Lymph nodes: No cervical, supraclavicular, or axillary lymphadenopathy Breasts:  Lungs: Clear to auscultation, resonant to percussion throughout Heart: Regular rhythm, no murmur, no gallop, no rub, no click, no edema Abdomen: Soft, nontender, normal bowel sounds, no mass, no organomegaly Extremities:  no calf tenderness. Right lower extremity heavily wrapped by the wound center and I did not remove the dressings. Chronic, advanced, venous stasis changes left lower extremity. No left lower extremity edema. Musculoskeletal: no joint deformities GU:  Vascular: Carotid pulses 2+, no bruits,  Neurologic: Alert, oriented, PERRLA, cranial nerves grossly normal, motor strength 5 over 5, reflexes 1+ symmetric, upper body coordination normal, gait normal, Skin: No rash or ecchymosis  Lab Results: CBC W/Diff    Component Value Date/Time   WBC 3.7 12/29/2015 1411   WBC 4.5 12/05/2015 1610   RBC 2.64 (LL) 12/29/2015 1411   RBC 2.65 (L) 12/05/2015 1610   HGB 8.7 (L) 12/12/2015 1322   HGB 10.4 (L) 05/17/2013 1258   HCT 26.7 (L) 12/29/2015 1411   HCT 31.5 (L) 05/17/2013 1258   PLT 161 12/29/2015 1411   MCV 101 (H) 12/29/2015 1411   MCV 104.7 (H) 05/17/2013 1258   MCH 33.0 12/29/2015 1411   MCH 32.8 12/05/2015 1610   MCHC 32.6 12/29/2015 1411   MCHC 31.1 12/05/2015 1610   RDW 16.5 (H) 12/29/2015 1411   RDW 16.0 (H) 05/17/2013 1258   LYMPHSABS 1.2 12/29/2015 1411   LYMPHSABS 1.5 05/17/2013 1258   MONOABS 1.3 (H) 05/18/2015 1658   MONOABS 0.8 05/17/2013 1258   EOSABS 0.0 12/29/2015 1411   BASOSABS 0.0 12/29/2015 1411   BASOSABS 0.0 05/17/2013 1258     Chemistry      Component Value Date/Time   NA 137 12/05/2015 1610   NA 137 08/14/2015 1117   NA 139 11/09/2012 1425   K  4.2 12/05/2015 1610   K 4.2 11/09/2012 1425   CL 108 12/05/2015 1610   CO2 22 12/05/2015 1610   CO2 24 11/09/2012 1425   BUN 15 12/05/2015 1610   BUN 21 08/14/2015 1117   BUN 22.4 11/09/2012 1425   CREATININE 0.96 12/05/2015 1610   CREATININE 0.88 10/17/2014 1101   CREATININE 1.2 11/09/2012 1425      Component Value Date/Time   CALCIUM 8.9 12/05/2015 1610   CALCIUM 9.3 11/09/2012 1425   ALKPHOS 81 08/14/2015 1117   ALKPHOS 62 11/09/2012 1425   AST 14 08/14/2015 1117   AST 20 11/09/2012 1425   ALT 8 08/14/2015 1117   ALT 19 11/09/2012 1425   BILITOT 0.4 08/14/2015 1117   BILITOT 0.57 11/09/2012 1425       Radiological Studies: No results found.  Impression:  #1. High risk myelodysplastic syndrome evolving out of a low risk MDS. Currently receiving supportive care only with every 3 weekly Aranesp injections to support his hemoglobin. Platelet count has been consistently normal. He has chronic monocytosis but no signs yet for transformation to acute leukemia. I will continue to monitor blood counts frequently. Hemoglobin has plateaued at a lower level despite Aranesp support as outlined above. We have been able  to spare him from repetitive transfusion since he has been on the Aranesp.I may be able to increase his Aranesp dose up to maximum of 500 g. If leukemic conversion occurs, due to his other comorbid medical conditions and age, he would not be a candidate for intensive antileukemic chemotherapy.  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 Recurrent cutaneous abscesses/ulcers and bilateral lower extremity cellulitis secondary to #1 complicated by chronic venous stasis. Status post aggressive debridement and skin grafting of a nonhealing ulcer medial right calf. He will continue close follow-up with orthopedic surgery. He is currently completing another prolonged course of oral doxycycline.  #4.chronic macrocytic anemia secondary to #1  #5.IgM monoclonal gammopathy  of undetermined significance  #6. History of prostate cancer treated with prostatectomy.  #7. Status post excision squamous cell carcinoma left calf.  #8. Chronic  lower extremity edema which does not appear to be cardiac or pulmonary related. Controlled with  elastic stockings.  #9. Hypothyroid on replacement   CC: Patient Care Team: Lajean Manes, MD as PCP - General (Internal Medicine) Annia Belt, MD as Consulting Physician (Oncology)   Annia Belt, MD 10/3/201710:40 AM

## 2016-01-09 NOTE — Patient Instructions (Signed)
Continue every 3 week aranesp CBC in Internal Med office in 2 months(03/12/16) & 4 months(05/13/16) Visit with Dr Darnell Level 4 months

## 2016-01-11 ENCOUNTER — Ambulatory Visit (INDEPENDENT_AMBULATORY_CARE_PROVIDER_SITE_OTHER): Payer: Medicare Other | Admitting: Orthopedic Surgery

## 2016-01-11 DIAGNOSIS — L02415 Cutaneous abscess of right lower limb: Secondary | ICD-10-CM

## 2016-01-11 DIAGNOSIS — I83212 Varicose veins of right lower extremity with both ulcer of calf and inflammation: Secondary | ICD-10-CM

## 2016-01-11 DIAGNOSIS — L97213 Non-pressure chronic ulcer of right calf with necrosis of muscle: Secondary | ICD-10-CM

## 2016-01-15 ENCOUNTER — Ambulatory Visit (INDEPENDENT_AMBULATORY_CARE_PROVIDER_SITE_OTHER): Payer: Medicare Other | Admitting: Orthopedic Surgery

## 2016-01-15 DIAGNOSIS — I83212 Varicose veins of right lower extremity with both ulcer of calf and inflammation: Secondary | ICD-10-CM

## 2016-01-15 DIAGNOSIS — L02415 Cutaneous abscess of right lower limb: Secondary | ICD-10-CM

## 2016-01-15 DIAGNOSIS — L97213 Non-pressure chronic ulcer of right calf with necrosis of muscle: Secondary | ICD-10-CM

## 2016-01-18 ENCOUNTER — Ambulatory Visit (INDEPENDENT_AMBULATORY_CARE_PROVIDER_SITE_OTHER): Payer: Medicare Other | Admitting: Orthopedic Surgery

## 2016-01-18 DIAGNOSIS — L02415 Cutaneous abscess of right lower limb: Secondary | ICD-10-CM

## 2016-01-18 DIAGNOSIS — I83212 Varicose veins of right lower extremity with both ulcer of calf and inflammation: Secondary | ICD-10-CM

## 2016-01-18 DIAGNOSIS — L97213 Non-pressure chronic ulcer of right calf with necrosis of muscle: Secondary | ICD-10-CM

## 2016-01-22 ENCOUNTER — Encounter (HOSPITAL_COMMUNITY)
Admission: RE | Admit: 2016-01-22 | Discharge: 2016-01-22 | Disposition: A | Discharge status: Home / Self Care | Payer: Medicare Other | Source: Ambulatory Visit | Attending: Oncology | Admitting: Oncology

## 2016-01-22 DIAGNOSIS — D46Z Other myelodysplastic syndromes: Secondary | ICD-10-CM | POA: Diagnosis not present

## 2016-01-22 DIAGNOSIS — D72821 Monocytosis (symptomatic): Secondary | ICD-10-CM | POA: Insufficient documentation

## 2016-01-22 DIAGNOSIS — D619 Aplastic anemia, unspecified: Secondary | ICD-10-CM | POA: Insufficient documentation

## 2016-01-22 LAB — POCT HEMOGLOBIN-HEMACUE: Hemoglobin: 8.5 g/dL — ABNORMAL LOW (ref 13.0–17.0)

## 2016-01-22 MED ORDER — DARBEPOETIN ALFA 300 MCG/0.6ML IJ SOSY
300.0000 ug | PREFILLED_SYRINGE | INTRAMUSCULAR | Status: DC
  Administered 2016-01-22: 300 ug via SUBCUTANEOUS
  Filled 2016-01-22: qty 0.6

## 2016-01-22 MED ORDER — DARBEPOETIN ALFA 150 MCG/0.3ML IJ SOSY
PREFILLED_SYRINGE | INTRAMUSCULAR | Status: AC
  Administered 2016-01-22: 300 ug via SUBCUTANEOUS
  Filled 2016-01-22: qty 0.6

## 2016-02-02 ENCOUNTER — Ambulatory Visit (INDEPENDENT_AMBULATORY_CARE_PROVIDER_SITE_OTHER): Payer: Medicare Other | Admitting: Family

## 2016-02-02 ENCOUNTER — Encounter (INDEPENDENT_AMBULATORY_CARE_PROVIDER_SITE_OTHER): Payer: Self-pay | Admitting: Family

## 2016-02-02 DIAGNOSIS — Z9889 Other specified postprocedural states: Secondary | ICD-10-CM

## 2016-02-02 DIAGNOSIS — I83212 Varicose veins of right lower extremity with both ulcer of calf and inflammation: Secondary | ICD-10-CM

## 2016-02-02 DIAGNOSIS — L97219 Non-pressure chronic ulcer of right calf with unspecified severity: Secondary | ICD-10-CM

## 2016-02-02 DIAGNOSIS — Z945 Skin transplant status: Secondary | ICD-10-CM

## 2016-02-02 NOTE — Progress Notes (Signed)
Wound Care Note   Patient: Joel Macdonald           Date of Birth: 08/29/1930           MRN: [id number]             PCP: Mathews Argyle, MD Visit Date: 02/02/2016   Assessment & Plan: Visit Diagnoses:  1. Hx of skin graft   2. 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 Will continue to wear compression stockings with sleeve. May place  abD pad between layers if excessive moisture.  Follow-Up Instructions: Return in about 2 weeks (around 02/16/2016).  Orders:  No orders of the defined types were placed in this encounter.  No orders of the defined types were placed in this encounter.     Procedures: No notes on file   Clinical Data: No additional findings.   No images are attached to the encounter.   Subjective: Chief Complaint  Patient presents with  . Right Leg - Routine Post Op  . Routine Post Op    skin grafting for venous stasis ulceration of the right lower extremity 12/06/15    Patient presents in office today for 2 week follow up of skin grafting for venous stasis ulceration of the right lower extremity. He is approximately 8 weeks post op. He is wearing the the two layer vive medical compression stocking and sleeve. There is foul smelling odor coming from wound. He applies silvadene cream to bottom of wound, he was hoping this would improve odor. There is large amount of fibronous tissue.    Review of Systems  Constitutional: Negative for chills and fever.      Objective: Vital Signs: There were no vitals taken for this visit.  Physical Exam: Ulceration post skin graft continues to show slow improvement. Has filled in about 3 mm circumferentially. There is some blackened discoloration in distal area of wound from silver nitrate. There is odor from maceration and moisture. Wound bed 90% filled in with exudative tissue. This was debrided with gauze. No  drainage or surrounding erythema. No sign of infection.   Specialty Comments: No specialty comments available.   PMFS History: Patient Active Problem List   Diagnosis Date Noted  . Hypothyroidism 01/09/2016  . Enterococcus faecalis infection   . Abscess   . Abscess of skin 11/17/2014  . Senile purpura ([place]) 10/18/2014  . MDS (myelodysplastic syndrome), high grade ([place]) 06/13/2014  . Edema, peripheral 03/21/2014  . Monocytosis 03/21/2014  . Venous insufficiency (chronic) (peripheral) 08/03/2013  . Vasculitis ([place]) 08/02/2013  . Cellulitis of right lower limb 07/12/2013  . Ischemia of extremity 07/12/2013  . Cellulitis of leg, left 07/12/2013  . Cellulitis and abscess of leg 07/12/2013  . Compression fracture of T12 vertebra ([place]) 10/08/2011  . MDS (myelodysplastic syndrome), low grade ([place]) 09/20/2011  . Pancytopenia, acquired ([place]) 06/17/2011  . MGUS (monoclonal gammopathy of unknown significance) 06/17/2011  . HIP PAIN, BILATERAL 11/07/2009  . COMPRESSION FRACTURE, THORACIC VERTEBRA 11/07/2009  . ADENOCARCINOMA, PROSTATE, GLEASON GRADE 7 10/17/2009  . BACK PAIN, LUMBAR 10/17/2009  . Sciatica 10/17/2009  . SKIN CANCER 06/05/2006  . HYPERTENSION, BENIGN SYSTEMIC 06/05/2006  . RHINITIS, ALLERGIC 06/05/2006  . REFLUX ESOPHAGITIS 06/05/2006   Past Medical History:  Diagnosis Date  . Abscess of skin 11/17/2014   Left ankle: incise & drain 11/17/14  . Cellulitis and abscess of leg 05/12/2015  rt leg  . Cellulitis of leg, right 07/12/2013  . Edema, peripheral 03/21/2014  . GERD (gastroesophageal reflux disease)   . Heart murmur 1936  . History of blood transfusion    last episode- 04/2015  . Hypothyroidism   . Inguinal hernia    reducible - on R side   . Ischemia of extremity 07/12/2013   Bilateral toes  2,3,4 right; 1,3 left  Vascular doppler normal for large vessel occlusion 2/15  . Lymphedema    R leg- in the past   . MDS (myelodysplastic syndrome), high grade ([place])  06/13/2014   7% blasts BM bx 05/05/14  . MDS (myelodysplastic syndrome), low grade ([place]) 09/20/2011   Bone marrow bx 06/13/11: mild dyserythro/dymegakaryopoiesis.  Normal cytogenetics.  FISH negative for chrom 5 or 7 deletions.  Scattered small infiltrates of plasmacytoid lymphs  . MGUS (monoclonal gammopathy of unknown significance) 06/17/2011  . Monocytosis 03/21/2014  . Osteoarthritis    "back, legs, arms, shoulders" (07/12/2013)  . Pancytopenia, acquired ([place]) 06/17/2011   "from my Myelodysplastic syndrome"  . Prostate cancer ([place])   . Senile purpura ([place]) 10/18/2014    Family History  Problem Relation Age of Onset  . Other Mother   . Other Father   . Heart disease Father   . Hyperlipidemia Son    Past Surgical History:  Procedure Laterality Date  . APPLICATION OF WOUND VAC Right 05/17/2015   Procedure: VAC placement;  Surgeon: Newt Minion, MD;  Location: [facility];  Service: Orthopedics;  Laterality: Right;  . CATARACT EXTRACTION W/ INTRAOCULAR LENS  IMPLANT, BILATERAL Bilateral ~ 2012  . I&D EXTREMITY Right 05/13/2015   Procedure: IRRIGATION AND DEBRIDEMENT ANKLE;  Surgeon: Newt Minion, MD;  Location: [facility];  Service: Orthopedics;  Laterality: Right;  . I&D EXTREMITY Right 05/17/2015   Procedure: IRRIGATION AND DEBRIDEMENT RIGHT LEG, APPLY SKIN GRAFT ;  Surgeon: Newt Minion, MD;  Location: [facility];  Service: Orthopedics;  Laterality: Right;  . ROBOT ASSISTED LAPAROSCOPIC RADICAL PROSTATECTOMY  ~ 2010  . SKIN SPLIT GRAFT Right 12/06/2015   Procedure: APPLY SKIN GRAFT SPLIT THICKNESS TO RIGHT LEG;  Surgeon: Newt Minion, MD;  Location: [facility];  Service: Orthopedics;  Laterality: Right;  . TONSILLECTOMY AND ADENOIDECTOMY  ~ 1939   Social History   Occupational History  . Not on file.   Social History Main Topics  . Smoking status: Former Smoker    Years: 40.00    Types: Pipe    Quit date: 07/12/1993  . Smokeless tobacco: Never Used  . Alcohol use 0.0 oz/week     Comment: occasionally.- q  evening- martini   . Drug use: No  . Sexual activity: Not on file

## 2016-02-12 ENCOUNTER — Encounter (HOSPITAL_COMMUNITY)
Admission: RE | Admit: 2016-02-12 | Discharge: 2016-02-12 | Disposition: A | Discharge status: Home / Self Care | Payer: Medicare Other | Source: Ambulatory Visit | Attending: Oncology | Admitting: Oncology

## 2016-02-12 DIAGNOSIS — D46Z Other myelodysplastic syndromes: Secondary | ICD-10-CM | POA: Diagnosis not present

## 2016-02-12 DIAGNOSIS — D4620 Refractory anemia with excess of blasts, unspecified: Secondary | ICD-10-CM

## 2016-02-12 DIAGNOSIS — D619 Aplastic anemia, unspecified: Secondary | ICD-10-CM | POA: Diagnosis present

## 2016-02-12 DIAGNOSIS — D61818 Other pancytopenia: Secondary | ICD-10-CM

## 2016-02-12 DIAGNOSIS — D72821 Monocytosis (symptomatic): Secondary | ICD-10-CM | POA: Insufficient documentation

## 2016-02-12 MED ORDER — DARBEPOETIN ALFA 300 MCG/0.6ML IJ SOSY
300.0000 ug | PREFILLED_SYRINGE | Freq: Once | INTRAMUSCULAR | Status: AC
  Administered 2016-02-12: 300 ug via SUBCUTANEOUS
  Filled 2016-02-12: qty 0.6

## 2016-02-12 MED ORDER — CLONIDINE HCL 0.1 MG PO TABS: 0.1000 mg | ORAL_TABLET | Freq: Once | ORAL | Status: DC | PRN

## 2016-02-12 MED ORDER — DARBEPOETIN ALFA 150 MCG/0.3ML IJ SOSY
PREFILLED_SYRINGE | INTRAMUSCULAR | Status: AC
  Administered 2016-02-12: 300 ug via SUBCUTANEOUS
  Filled 2016-02-12: qty 0.6

## 2016-02-13 LAB — POCT HEMOGLOBIN-HEMACUE: Hemoglobin: 8.4 g/dL — ABNORMAL LOW (ref 13.0–17.0)

## 2016-02-16 ENCOUNTER — Encounter (INDEPENDENT_AMBULATORY_CARE_PROVIDER_SITE_OTHER): Payer: Self-pay | Admitting: Orthopedic Surgery

## 2016-02-16 ENCOUNTER — Ambulatory Visit (INDEPENDENT_AMBULATORY_CARE_PROVIDER_SITE_OTHER): Payer: Medicare Other | Admitting: Orthopedic Surgery

## 2016-02-16 VITALS — Ht 73.0 in | Wt 192.0 lb

## 2016-02-16 DIAGNOSIS — I87331 Chronic venous hypertension (idiopathic) with ulcer and inflammation of right lower extremity: Secondary | ICD-10-CM | POA: Diagnosis not present

## 2016-02-16 DIAGNOSIS — L97919 Non-pressure chronic ulcer of unspecified part of right lower leg with unspecified severity: Secondary | ICD-10-CM

## 2016-02-16 NOTE — Progress Notes (Signed)
Wound Care Note   Patient: Joel Macdonald           Date of Birth: 1930-11-26           MRN: [id number]             PCP: Mathews Argyle, MD Visit Date: 02/16/2016   Assessment & Plan: Visit Diagnoses:  1. Chronic venous hypertension with ulcer and inflammation involving right side ([place])     Plan: Follow-up in the office in 2 weeks. Continue wound care with dialysis of cleansing daily 2 layer medical compression sock to the day 1 layer at night    Follow-Up Instructions: Return in about 2 weeks (around 03/01/2016).  Orders:  No orders of the defined types were placed in this encounter.  No orders of the defined types were placed in this encounter.     Procedures: No notes on file   Clinical Data: No additional findings.   No images are attached to the encounter.   Subjective: Chief Complaint  Patient presents with  . Right Leg - Wound Check    Lateral side ulceration s/p skin graft and prevena wound vac application 0000000    Patient has a large lateral side calf ulcer without depth. He is treating with a Vive compression sock and sleeve that he wears and changes daily. He does have a small amount of bloody drainage on the sock and there is no swelling. Pt is wondering when the wound will close states that it will be a year in [place] since this all began.   Wound Check     Review of Systems  Miscellaneous:  -Home Health Care: N/A  -Physical Therapy: N/A  -Out of Work?: N/A  -Worker's Compensation Case?: N/A  -Additional Information: N/A   Objective: Vital Signs: Ht 6\' 1"  (1.854 m)   Wt 192 lb (87.1 kg)   BMI 25.33 kg/m   Physical Exam: On examination patient has brawny skin color changes with no cellulitis no odor very small amount of serous drainage. The lateral ulcer measures 10 x 7 cm and is 1 mm deep. The proximal aspect has a hard eschar inferiorly there is good granulation tissue. Patient continues this show slow interval healing. He  has not on antibiotics at this time.  Specialty Comments: No specialty comments available.   PMFS History: Patient Active Problem List   Diagnosis Date Noted  . Hypothyroidism 01/09/2016  . Enterococcus faecalis infection   . Abscess   . Abscess of skin 11/17/2014  . Senile purpura ([place]) 10/18/2014  . MDS (myelodysplastic syndrome), high grade ([place]) 06/13/2014  . Edema, peripheral 03/21/2014  . Monocytosis 03/21/2014  . Venous insufficiency (chronic) (peripheral) 08/03/2013  . Vasculitis ([place]) 08/02/2013  . Cellulitis of right lower limb 07/12/2013  . Ischemia of extremity 07/12/2013  . Cellulitis of leg, left 07/12/2013  . Cellulitis and abscess of leg 07/12/2013  . Compression fracture of T12 vertebra ([place]) 10/08/2011  . MDS (myelodysplastic syndrome), low grade ([place]) 09/20/2011  . Pancytopenia, acquired ([place]) 06/17/2011  . MGUS (monoclonal gammopathy of unknown significance) 06/17/2011  . HIP PAIN, BILATERAL 11/07/2009  . COMPRESSION FRACTURE, THORACIC VERTEBRA 11/07/2009  . ADENOCARCINOMA, PROSTATE, GLEASON GRADE 7 10/17/2009  . BACK PAIN, LUMBAR 10/17/2009  . Sciatica 10/17/2009  . SKIN CANCER 06/05/2006  . HYPERTENSION, BENIGN SYSTEMIC 06/05/2006  . RHINITIS, ALLERGIC 06/05/2006  . REFLUX ESOPHAGITIS 06/05/2006   Past Medical History:  Diagnosis Date  . Abscess of skin 11/17/2014   Left ankle: incise &  drain 11/17/14  . Cellulitis and abscess of leg 05/12/2015   rt leg  . Cellulitis of leg, right 07/12/2013  . Edema, peripheral 03/21/2014  . GERD (gastroesophageal reflux disease)   . Heart murmur 1936  . History of blood transfusion    last episode- 04/2015  . Hypothyroidism   . Inguinal hernia    reducible - on R side   . Ischemia of extremity 07/12/2013   Bilateral toes  2,3,4 right; 1,3 left  Vascular doppler normal for large vessel occlusion 2/15  . Lymphedema    R leg- in the past   . MDS (myelodysplastic syndrome), high grade ([place]) 06/13/2014   7% blasts BM  bx 05/05/14  . MDS (myelodysplastic syndrome), low grade ([place]) 09/20/2011   Bone marrow bx 06/13/11: mild dyserythro/dymegakaryopoiesis.  Normal cytogenetics.  FISH negative for chrom 5 or 7 deletions.  Scattered small infiltrates of plasmacytoid lymphs  . MGUS (monoclonal gammopathy of unknown significance) 06/17/2011  . Monocytosis 03/21/2014  . Osteoarthritis    "back, legs, arms, shoulders" (07/12/2013)  . Pancytopenia, acquired ([place]) 06/17/2011   "from my Myelodysplastic syndrome"  . Prostate cancer ([place])   . Senile purpura ([place]) 10/18/2014    Family History  Problem Relation Age of Onset  . Other Mother   . Other Father   . Heart disease Father   . Hyperlipidemia Son    Past Surgical History:  Procedure Laterality Date  . APPLICATION OF WOUND VAC Right 05/17/2015   Procedure: VAC placement;  Surgeon: Newt Minion, MD;  Location: [facility];  Service: Orthopedics;  Laterality: Right;  . CATARACT EXTRACTION W/ INTRAOCULAR LENS  IMPLANT, BILATERAL Bilateral ~ 2012  . I&D EXTREMITY Right 05/13/2015   Procedure: IRRIGATION AND DEBRIDEMENT ANKLE;  Surgeon: Newt Minion, MD;  Location: [facility];  Service: Orthopedics;  Laterality: Right;  . I&D EXTREMITY Right 05/17/2015   Procedure: IRRIGATION AND DEBRIDEMENT RIGHT LEG, APPLY SKIN GRAFT ;  Surgeon: Newt Minion, MD;  Location: [facility];  Service: Orthopedics;  Laterality: Right;  . ROBOT ASSISTED LAPAROSCOPIC RADICAL PROSTATECTOMY  ~ 2010  . SKIN SPLIT GRAFT Right 12/06/2015   Procedure: APPLY SKIN GRAFT SPLIT THICKNESS TO RIGHT LEG;  Surgeon: Newt Minion, MD;  Location: [facility];  Service: Orthopedics;  Laterality: Right;  . TONSILLECTOMY AND ADENOIDECTOMY  ~ 1939   Social History   Occupational History  . Not on file.   Social History Main Topics  . Smoking status: Former Smoker    Years: 40.00    Types: Pipe    Quit date: 07/12/1993  . Smokeless tobacco: Never Used  . Alcohol use 0.0 oz/week     Comment: occasionally.- q evening- martini   .  Drug use: No  . Sexual activity: Not on file

## 2016-03-04 ENCOUNTER — Encounter (HOSPITAL_COMMUNITY)
Admission: RE | Admit: 2016-03-04 | Discharge: 2016-03-04 | Disposition: A | Discharge status: Home / Self Care | Payer: Medicare Other | Source: Ambulatory Visit | Attending: Oncology | Admitting: Oncology

## 2016-03-04 ENCOUNTER — Other Ambulatory Visit: Payer: Self-pay | Admitting: Oncology

## 2016-03-04 ENCOUNTER — Encounter (INDEPENDENT_AMBULATORY_CARE_PROVIDER_SITE_OTHER): Payer: Self-pay | Admitting: Orthopedic Surgery

## 2016-03-04 ENCOUNTER — Ambulatory Visit (INDEPENDENT_AMBULATORY_CARE_PROVIDER_SITE_OTHER): Payer: Medicare Other | Admitting: Orthopedic Surgery

## 2016-03-04 VITALS — Ht 73.0 in | Wt 185.0 lb

## 2016-03-04 DIAGNOSIS — D72821 Monocytosis (symptomatic): Secondary | ICD-10-CM

## 2016-03-04 DIAGNOSIS — D4620 Refractory anemia with excess of blasts, unspecified: Secondary | ICD-10-CM

## 2016-03-04 DIAGNOSIS — D61818 Other pancytopenia: Secondary | ICD-10-CM

## 2016-03-04 DIAGNOSIS — I87331 Chronic venous hypertension (idiopathic) with ulcer and inflammation of right lower extremity: Secondary | ICD-10-CM | POA: Insufficient documentation

## 2016-03-04 DIAGNOSIS — D649 Anemia, unspecified: Secondary | ICD-10-CM

## 2016-03-04 DIAGNOSIS — D46Z Other myelodysplastic syndromes: Secondary | ICD-10-CM

## 2016-03-04 DIAGNOSIS — L97919 Non-pressure chronic ulcer of unspecified part of right lower leg with unspecified severity: Principal | ICD-10-CM

## 2016-03-04 LAB — POCT HEMOGLOBIN-HEMACUE: HEMOGLOBIN: 6.9 g/dL — AB (ref 13.0–17.0)

## 2016-03-04 MED ORDER — CLONIDINE HCL 0.1 MG PO TABS: 0.1000 mg | ORAL_TABLET | Freq: Once | ORAL | Status: DC | PRN

## 2016-03-04 MED ORDER — DARBEPOETIN ALFA 150 MCG/0.3ML IJ SOSY
PREFILLED_SYRINGE | INTRAMUSCULAR | Status: AC
  Filled 2016-03-04: qty 0.6

## 2016-03-04 MED ORDER — DARBEPOETIN ALFA 300 MCG/0.6ML IJ SOSY
300.0000 ug | PREFILLED_SYRINGE | Freq: Once | INTRAMUSCULAR | Status: AC
  Administered 2016-03-04: 300 ug via SUBCUTANEOUS
  Filled 2016-03-04: qty 0.6

## 2016-03-04 NOTE — Progress Notes (Signed)
Wound Care Note   Patient: Joel Macdonald           Date of Birth: 09-16-1930           MRN: [id number]             PCP: Mathews Argyle, MD Visit Date: 03/04/2016   Assessment & Plan: Visit Diagnoses:  1. Idiopathic chronic venous hypertension of right lower extremity with ulcer and inflammation (HCC)     Plan: Patient presents with some increasing necrosis of the skin graft. We will start him on Santyl dressing changes there is no cellulitis no signs of infection. Patient is reluctant to proceed with surgery at this time. I recommended admission for debridement placement of a vera flow wound VAC and then return to the operating room for skin grafting and a VAC. Patient states she would like to get through the holiday season if he can without surgery will follow-up next week.   Follow-Up Instructions: Return in about 1 week (around 03/11/2016).  Orders:  No orders of the defined types were placed in this encounter.  No orders of the defined types were placed in this encounter.     Procedures: No notes on file   Clinical Data: No additional findings.   No images are attached to the encounter.   Subjective: Chief Complaint  Patient presents with  . Right Leg - Follow-up    Patient returns today for 2 week follow up of right lateral side calf ulcer. Patient is wearing 2 layer compression socks. Patient states he thinks everything is the same. Changes socks every other day. Having drainage and odor.     Review of Systems     Objective: Vital Signs: Ht 6\' 1"  (1.854 m)   Wt 185 lb (83.9 kg)   BMI 24.41 kg/m   Physical Exam: Examination patient's wound has good healing inferiorly there is no ascending cellulitis his venous stasis swelling is minimal he has brawny skin color changes the most proximal aspect of the wound shows necrosis of the eschar. This is debrided with a 10 blade knife. There was some good bleeding. There is no abscess no  cellulitis.  Specialty Comments: No specialty comments available.   PMFS History: Patient Active Problem List   Diagnosis Date Noted  . Idiopathic chronic venous hypertension of right lower extremity with ulcer and inflammation ([place]) 03/04/2016  . Hypothyroidism 01/09/2016  . Enterococcus faecalis infection   . Abscess   . Abscess of skin 11/17/2014  . Senile purpura ([place]) 10/18/2014  . MDS (myelodysplastic syndrome), high grade ([place]) 06/13/2014  . Edema, peripheral 03/21/2014  . Monocytosis 03/21/2014  . Venous insufficiency (chronic) (peripheral) 08/03/2013  . Vasculitis ([place]) 08/02/2013  . Cellulitis of right lower limb 07/12/2013  . Ischemia of extremity 07/12/2013  . Cellulitis of leg, left 07/12/2013  . Cellulitis and abscess of leg 07/12/2013  . Compression fracture of T12 vertebra ([place]) 10/08/2011  . MDS (myelodysplastic syndrome), low grade ([place]) 09/20/2011  . Pancytopenia, acquired ([place]) 06/17/2011  . MGUS (monoclonal gammopathy of unknown significance) 06/17/2011  . HIP PAIN, BILATERAL 11/07/2009  . COMPRESSION FRACTURE, THORACIC VERTEBRA 11/07/2009  . ADENOCARCINOMA, PROSTATE, GLEASON GRADE 7 10/17/2009  . BACK PAIN, LUMBAR 10/17/2009  . Sciatica 10/17/2009  . SKIN CANCER 06/05/2006  . HYPERTENSION, BENIGN SYSTEMIC 06/05/2006  . RHINITIS, ALLERGIC 06/05/2006  . REFLUX ESOPHAGITIS 06/05/2006   Past Medical History:  Diagnosis Date  . Abscess of skin 11/17/2014   Left ankle: incise & drain 11/17/14  .  Cellulitis and abscess of leg 05/12/2015   rt leg  . Cellulitis of leg, right 07/12/2013  . Edema, peripheral 03/21/2014  . GERD (gastroesophageal reflux disease)   . Heart murmur 1936  . History of blood transfusion    last episode- 04/2015  . Hypothyroidism   . Inguinal hernia    reducible - on R side   . Ischemia of extremity 07/12/2013   Bilateral toes  2,3,4 right; 1,3 left  Vascular doppler normal for large vessel occlusion 2/15  . Lymphedema    R leg- in  the past   . MDS (myelodysplastic syndrome), high grade ([place]) 06/13/2014   7% blasts BM bx 05/05/14  . MDS (myelodysplastic syndrome), low grade ([place]) 09/20/2011   Bone marrow bx 06/13/11: mild dyserythro/dymegakaryopoiesis.  Normal cytogenetics.  FISH negative for chrom 5 or 7 deletions.  Scattered small infiltrates of plasmacytoid lymphs  . MGUS (monoclonal gammopathy of unknown significance) 06/17/2011  . Monocytosis 03/21/2014  . Osteoarthritis    "back, legs, arms, shoulders" (07/12/2013)  . Pancytopenia, acquired ([place]) 06/17/2011   "from my Myelodysplastic syndrome"  . Prostate cancer ([place])   . Senile purpura ([place]) 10/18/2014    Family History  Problem Relation Age of Onset  . Other Mother   . Other Father   . Heart disease Father   . Hyperlipidemia Son    Past Surgical History:  Procedure Laterality Date  . APPLICATION OF WOUND VAC Right 05/17/2015   Procedure: VAC placement;  Surgeon: Newt Minion, MD;  Location: [facility];  Service: Orthopedics;  Laterality: Right;  . CATARACT EXTRACTION W/ INTRAOCULAR LENS  IMPLANT, BILATERAL Bilateral ~ 2012  . I&D EXTREMITY Right 05/13/2015   Procedure: IRRIGATION AND DEBRIDEMENT ANKLE;  Surgeon: Newt Minion, MD;  Location: [facility];  Service: Orthopedics;  Laterality: Right;  . I&D EXTREMITY Right 05/17/2015   Procedure: IRRIGATION AND DEBRIDEMENT RIGHT LEG, APPLY SKIN GRAFT ;  Surgeon: Newt Minion, MD;  Location: [facility];  Service: Orthopedics;  Laterality: Right;  . ROBOT ASSISTED LAPAROSCOPIC RADICAL PROSTATECTOMY  ~ 2010  . SKIN SPLIT GRAFT Right 12/06/2015   Procedure: APPLY SKIN GRAFT SPLIT THICKNESS TO RIGHT LEG;  Surgeon: Newt Minion, MD;  Location: [facility];  Service: Orthopedics;  Laterality: Right;  . TONSILLECTOMY AND ADENOIDECTOMY  ~ 1939   Social History   Occupational History  . Not on file.   Social History Main Topics  . Smoking status: Former Smoker    Years: 40.00    Types: Pipe    Quit date: 07/12/1993  . Smokeless tobacco: Never  Used  . Alcohol use 0.0 oz/week     Comment: occasionally.- q evening- martini   . Drug use: No  . Sexual activity: Not on file

## 2016-03-05 ENCOUNTER — Other Ambulatory Visit (HOSPITAL_COMMUNITY): Payer: Self-pay | Admitting: *Deleted

## 2016-03-06 ENCOUNTER — Encounter (HOSPITAL_COMMUNITY)
Admission: RE | Admit: 2016-03-06 | Discharge: 2016-03-06 | Disposition: A | Discharge status: Home / Self Care | Payer: Medicare Other | Source: Ambulatory Visit | Attending: Oncology | Admitting: Oncology

## 2016-03-06 DIAGNOSIS — D46Z Other myelodysplastic syndromes: Secondary | ICD-10-CM | POA: Diagnosis not present

## 2016-03-06 DIAGNOSIS — D649 Anemia, unspecified: Secondary | ICD-10-CM

## 2016-03-06 LAB — PREPARE RBC (CROSSMATCH)

## 2016-03-07 ENCOUNTER — Encounter (HOSPITAL_COMMUNITY)
Admission: RE | Admit: 2016-03-07 | Discharge: 2016-03-07 | Disposition: A | Discharge status: Home / Self Care | Payer: Medicare Other | Source: Ambulatory Visit | Attending: Oncology | Admitting: Oncology

## 2016-03-07 DIAGNOSIS — D46Z Other myelodysplastic syndromes: Secondary | ICD-10-CM | POA: Diagnosis not present

## 2016-03-07 DIAGNOSIS — D649 Anemia, unspecified: Secondary | ICD-10-CM

## 2016-03-07 MED ORDER — SODIUM CHLORIDE 0.9 % IV SOLN: Freq: Once | INTRAVENOUS | Status: DC

## 2016-03-08 LAB — TYPE AND SCREEN
ABO/RH(D): O POS
ANTIBODY SCREEN: POSITIVE
DAT, IGG: NEGATIVE
Donor AG Type: NEGATIVE
Donor AG Type: NEGATIVE
PT AG Type: NEGATIVE
UNIT DIVISION: 0
UNIT DIVISION: 0

## 2016-03-11 ENCOUNTER — Ambulatory Visit (INDEPENDENT_AMBULATORY_CARE_PROVIDER_SITE_OTHER): Payer: Medicare Other | Admitting: Orthopedic Surgery

## 2016-03-11 DIAGNOSIS — Z945 Skin transplant status: Secondary | ICD-10-CM

## 2016-03-11 DIAGNOSIS — I87331 Chronic venous hypertension (idiopathic) with ulcer and inflammation of right lower extremity: Secondary | ICD-10-CM | POA: Diagnosis not present

## 2016-03-11 DIAGNOSIS — L97919 Non-pressure chronic ulcer of unspecified part of right lower leg with unspecified severity: Principal | ICD-10-CM

## 2016-03-11 MED ORDER — DOXYCYCLINE HYCLATE 100 MG PO TABS: 100.0000 mg | ORAL_TABLET | Freq: Two times a day (BID) | ORAL | 0 refills | Status: DC

## 2016-03-11 NOTE — Progress Notes (Signed)
Office Visit Note   Patient: Joel Macdonald           Date of Birth: [birth year]           MRN: [id number] Visit Date: 03/11/2016              Requested by: Lajean Manes, MD [address], [place] [address] PCP: Mathews Argyle, MD   Assessment & Plan: Visit Diagnoses:  1. Idiopathic chronic venous hypertension of right lower extremity with ulcer and inflammation (HCC)   2. Status post skin graft     Plan: Patient has increased necrosis of the skin graft. He has increased swelling his white cell count dropped despite everything will wait with his myelodysplastic syndrome I feel the inflammatory process in his right leg is causing the decreased hemoglobin. Recommended pursuing a repeat irrigation and debridement at this time patient states he has to wait until Saturday. We have called in a prescription for doxycycline we will apply silver cell plus a Dynaflex wraps. Plan for debridement of the necrotic skin graft right leg placement of a vera flow wound VAC and then return to the operating room the following Wednesday for placement of skin graft and a wound VAC.  Follow-Up Instructions: Return in about 2 weeks (around 03/25/2016).   Orders:  No orders of the defined types were placed in this encounter.  Meds ordered this encounter  Medications  . doxycycline (VIBRA-TABS) 100 MG tablet    Sig: Take 1 tablet (100 mg total) by mouth 2 (two) times daily.    Dispense:  60 tablet    Refill:  0      Procedures: No procedures performed   Clinical Data: No additional findings.   Subjective: No chief complaint on file.   HPI  Review of Systems   Objective: Vital Signs: There were no vitals taken for this visit.  Physical Exam Examination patient has increased necrotic tissue over the skin graft area. There is no ascending cellulitis but he does have increased venous stasis swelling. Ortho Exam  Specialty Comments:  No specialty comments  available.  Imaging: No results found.   PMFS History: Patient Active Problem List   Diagnosis Date Noted  . Status post skin graft 03/11/2016  . Idiopathic chronic venous hypertension of right lower extremity with ulcer and inflammation ([place]) 03/04/2016  . Hypothyroidism 01/09/2016  . Enterococcus faecalis infection   . Abscess   . Abscess of skin 11/17/2014  . Senile purpura ([place]) 10/18/2014  . MDS (myelodysplastic syndrome), high grade ([place]) 06/13/2014  . Edema, peripheral 03/21/2014  . Monocytosis 03/21/2014  . Venous insufficiency (chronic) (peripheral) 08/03/2013  . Vasculitis ([place]) 08/02/2013  . Cellulitis of right lower limb 07/12/2013  . Ischemia of extremity 07/12/2013  . Cellulitis of leg, left 07/12/2013  . Cellulitis and abscess of leg 07/12/2013  . Compression fracture of T12 vertebra ([place]) 10/08/2011  . MDS (myelodysplastic syndrome), low grade ([place]) 09/20/2011  . Pancytopenia, acquired ([place]) 06/17/2011  . MGUS (monoclonal gammopathy of unknown significance) 06/17/2011  . HIP PAIN, BILATERAL 11/07/2009  . COMPRESSION FRACTURE, THORACIC VERTEBRA 11/07/2009  . ADENOCARCINOMA, PROSTATE, GLEASON GRADE 7 10/17/2009  . BACK PAIN, LUMBAR 10/17/2009  . Sciatica 10/17/2009  . SKIN CANCER 06/05/2006  . HYPERTENSION, BENIGN SYSTEMIC 06/05/2006  . RHINITIS, ALLERGIC 06/05/2006  . REFLUX ESOPHAGITIS 06/05/2006   Past Medical History:  Diagnosis Date  . Abscess of skin 11/17/2014   Left ankle: incise & drain 11/17/14  . Cellulitis  and abscess of leg 05/12/2015   rt leg  . Cellulitis of leg, right 07/12/2013  . Edema, peripheral 03/21/2014  . GERD (gastroesophageal reflux disease)   . Heart murmur 1936  . History of blood transfusion    last episode- 04/2015  . Hypothyroidism   . Inguinal hernia    reducible - on R side   . Ischemia of extremity 07/12/2013   Bilateral toes  2,3,4 right; 1,3 left  Vascular doppler normal for large vessel occlusion 2/15  . Lymphedema      R leg- in the past   . MDS (myelodysplastic syndrome), high grade ([place]) 06/13/2014   7% blasts BM bx 05/05/14  . MDS (myelodysplastic syndrome), low grade ([place]) 09/20/2011   Bone marrow bx 06/13/11: mild dyserythro/dymegakaryopoiesis.  Normal cytogenetics.  FISH negative for chrom 5 or 7 deletions.  Scattered small infiltrates of plasmacytoid lymphs  . MGUS (monoclonal gammopathy of unknown significance) 06/17/2011  . Monocytosis 03/21/2014  . Osteoarthritis    "back, legs, arms, shoulders" (07/12/2013)  . Pancytopenia, acquired ([place]) 06/17/2011   "from my Myelodysplastic syndrome"  . Prostate cancer ([place])   . Senile purpura ([place]) 10/18/2014    Family History  Problem Relation Age of Onset  . Other Mother   . Other Father   . Heart disease Father   . Hyperlipidemia Son     Past Surgical History:  Procedure Laterality Date  . APPLICATION OF WOUND VAC Right 05/17/2015   Procedure: VAC placement;  Surgeon: Newt Minion, MD;  Location: [facility];  Service: Orthopedics;  Laterality: Right;  . CATARACT EXTRACTION W/ INTRAOCULAR LENS  IMPLANT, BILATERAL Bilateral ~ 2012  . I&D EXTREMITY Right 05/13/2015   Procedure: IRRIGATION AND DEBRIDEMENT ANKLE;  Surgeon: Newt Minion, MD;  Location: [facility];  Service: Orthopedics;  Laterality: Right;  . I&D EXTREMITY Right 05/17/2015   Procedure: IRRIGATION AND DEBRIDEMENT RIGHT LEG, APPLY SKIN GRAFT ;  Surgeon: Newt Minion, MD;  Location: [facility];  Service: Orthopedics;  Laterality: Right;  . ROBOT ASSISTED LAPAROSCOPIC RADICAL PROSTATECTOMY  ~ 2010  . SKIN SPLIT GRAFT Right 12/06/2015   Procedure: APPLY SKIN GRAFT SPLIT THICKNESS TO RIGHT LEG;  Surgeon: Newt Minion, MD;  Location: [facility];  Service: Orthopedics;  Laterality: Right;  . TONSILLECTOMY AND ADENOIDECTOMY  ~ 1939   Social History   Occupational History  . Not on file.   Social History Main Topics  . Smoking status: Former Smoker    Years: 40.00    Types: Pipe    Quit date: 07/12/1993  . Smokeless  tobacco: Never Used  . Alcohol use 0.0 oz/week     Comment: occasionally.- q evening- martini   . Drug use: No  . Sexual activity: Not on file

## 2016-03-14 ENCOUNTER — Encounter (HOSPITAL_COMMUNITY): Payer: Self-pay | Admitting: *Deleted

## 2016-03-14 ENCOUNTER — Other Ambulatory Visit (INDEPENDENT_AMBULATORY_CARE_PROVIDER_SITE_OTHER): Payer: Self-pay | Admitting: Family

## 2016-03-14 NOTE — Progress Notes (Signed)
Spoke with pt for pre-op call. Pt denies cardiac history, chest pain or sob. Pt states he is not diabetic. 

## 2016-03-16 ENCOUNTER — Inpatient Hospital Stay (HOSPITAL_COMMUNITY)
Admission: RE | Admit: 2016-03-16 | Discharge: 2016-03-20 | DRG: 264 | Disposition: A | Discharge status: Home / Self Care | Payer: Medicare Other | Source: Ambulatory Visit | Attending: Orthopedic Surgery | Admitting: Orthopedic Surgery

## 2016-03-16 ENCOUNTER — Encounter (HOSPITAL_COMMUNITY)
Admission: RE | Disposition: A | Discharge status: Home / Self Care | Payer: Self-pay | Source: Ambulatory Visit | Attending: Orthopedic Surgery

## 2016-03-16 ENCOUNTER — Encounter (HOSPITAL_COMMUNITY): Payer: Self-pay | Admitting: Certified Registered Nurse Anesthetist

## 2016-03-16 ENCOUNTER — Inpatient Hospital Stay (HOSPITAL_COMMUNITY): Payer: Medicare Other | Admitting: Certified Registered Nurse Anesthetist

## 2016-03-16 DIAGNOSIS — I872 Venous insufficiency (chronic) (peripheral): Secondary | ICD-10-CM | POA: Diagnosis present

## 2016-03-16 DIAGNOSIS — Z7982 Long term (current) use of aspirin: Secondary | ICD-10-CM | POA: Diagnosis not present

## 2016-03-16 DIAGNOSIS — Z9079 Acquired absence of other genital organ(s): Secondary | ICD-10-CM

## 2016-03-16 DIAGNOSIS — Z9889 Other specified postprocedural states: Secondary | ICD-10-CM | POA: Diagnosis not present

## 2016-03-16 DIAGNOSIS — I83018 Varicose veins of right lower extremity with ulcer other part of lower leg: Principal | ICD-10-CM | POA: Diagnosis present

## 2016-03-16 DIAGNOSIS — I83019 Varicose veins of right lower extremity with ulcer of unspecified site: Secondary | ICD-10-CM | POA: Diagnosis not present

## 2016-03-16 DIAGNOSIS — Z9841 Cataract extraction status, right eye: Secondary | ICD-10-CM | POA: Diagnosis not present

## 2016-03-16 DIAGNOSIS — L02415 Cutaneous abscess of right lower limb: Secondary | ICD-10-CM

## 2016-03-16 DIAGNOSIS — I96 Gangrene, not elsewhere classified: Secondary | ICD-10-CM | POA: Diagnosis present

## 2016-03-16 DIAGNOSIS — Z961 Presence of intraocular lens: Secondary | ICD-10-CM | POA: Diagnosis present

## 2016-03-16 DIAGNOSIS — L97819 Non-pressure chronic ulcer of other part of right lower leg with unspecified severity: Secondary | ICD-10-CM | POA: Diagnosis present

## 2016-03-16 DIAGNOSIS — D469 Myelodysplastic syndrome, unspecified: Secondary | ICD-10-CM | POA: Diagnosis present

## 2016-03-16 DIAGNOSIS — Z79899 Other long term (current) drug therapy: Secondary | ICD-10-CM

## 2016-03-16 DIAGNOSIS — L97919 Non-pressure chronic ulcer of unspecified part of right lower leg with unspecified severity: Secondary | ICD-10-CM

## 2016-03-16 DIAGNOSIS — E039 Hypothyroidism, unspecified: Secondary | ICD-10-CM | POA: Diagnosis present

## 2016-03-16 DIAGNOSIS — Z8546 Personal history of malignant neoplasm of prostate: Secondary | ICD-10-CM

## 2016-03-16 DIAGNOSIS — Z9842 Cataract extraction status, left eye: Secondary | ICD-10-CM | POA: Diagnosis not present

## 2016-03-16 DIAGNOSIS — B965 Pseudomonas (aeruginosa) (mallei) (pseudomallei) as the cause of diseases classified elsewhere: Secondary | ICD-10-CM | POA: Diagnosis present

## 2016-03-16 DIAGNOSIS — Z888 Allergy status to other drugs, medicaments and biological substances status: Secondary | ICD-10-CM

## 2016-03-16 DIAGNOSIS — D62 Acute posthemorrhagic anemia: Secondary | ICD-10-CM | POA: Diagnosis present

## 2016-03-16 DIAGNOSIS — D638 Anemia in other chronic diseases classified elsewhere: Secondary | ICD-10-CM | POA: Diagnosis present

## 2016-03-16 DIAGNOSIS — Z87891 Personal history of nicotine dependence: Secondary | ICD-10-CM

## 2016-03-16 DIAGNOSIS — Z7951 Long term (current) use of inhaled steroids: Secondary | ICD-10-CM | POA: Diagnosis not present

## 2016-03-16 DIAGNOSIS — K219 Gastro-esophageal reflux disease without esophagitis: Secondary | ICD-10-CM | POA: Diagnosis present

## 2016-03-16 DIAGNOSIS — Z8249 Family history of ischemic heart disease and other diseases of the circulatory system: Secondary | ICD-10-CM | POA: Diagnosis not present

## 2016-03-16 HISTORY — PX: I&D EXTREMITY: SHX5045

## 2016-03-16 HISTORY — DX: Dyspnea, unspecified: R06.00

## 2016-03-16 LAB — CBC
HCT: 28.8 % — ABNORMAL LOW (ref 39.0–52.0)
Hemoglobin: 9.1 g/dL — ABNORMAL LOW (ref 13.0–17.0)
MCH: 32.2 pg (ref 26.0–34.0)
MCHC: 31.6 g/dL (ref 30.0–36.0)
MCV: 101.8 fL — ABNORMAL HIGH (ref 78.0–100.0)
PLATELETS: 140 10*3/uL — AB (ref 150–400)
RBC: 2.83 MIL/uL — AB (ref 4.22–5.81)
RDW: 18 % — ABNORMAL HIGH (ref 11.5–15.5)
WBC: 3.1 10*3/uL — ABNORMAL LOW (ref 4.0–10.5)

## 2016-03-16 SURGERY — IRRIGATION AND DEBRIDEMENT EXTREMITY
Anesthesia: General | Site: Leg Lower | Laterality: Right

## 2016-03-16 MED ORDER — TRANEXAMIC ACID 1000 MG/10ML IV SOLN
2000.0000 mg | INTRAVENOUS | Status: DC
  Filled 2016-03-16: qty 20

## 2016-03-16 MED ORDER — VANCOMYCIN HCL 10 G IV SOLR
1500.0000 mg | INTRAVENOUS | Status: DC
  Administered 2016-03-16 – 2016-03-17 (×2): 1500 mg via INTRAVENOUS
  Filled 2016-03-16 (×3): qty 1500

## 2016-03-16 MED ORDER — METHOCARBAMOL 1000 MG/10ML IJ SOLN
500.0000 mg | Freq: Four times a day (QID) | INTRAVENOUS | Status: DC | PRN
  Filled 2016-03-16: qty 5

## 2016-03-16 MED ORDER — ONDANSETRON HCL 4 MG/2ML IJ SOLN
INTRAMUSCULAR | Status: DC | PRN
  Administered 2016-03-16: 4 mg via INTRAVENOUS

## 2016-03-16 MED ORDER — ASPIRIN 325 MG PO TABS: 650.0000 mg | ORAL_TABLET | Freq: Every day | ORAL | Status: DC | PRN

## 2016-03-16 MED ORDER — ASPIRIN EC 325 MG PO TBEC
325.0000 mg | DELAYED_RELEASE_TABLET | Freq: Every day | ORAL | Status: DC
  Administered 2016-03-16 – 2016-03-19 (×3): 325 mg via ORAL
  Filled 2016-03-16 (×3): qty 1

## 2016-03-16 MED ORDER — BISACODYL 5 MG PO TBEC: 5.0000 mg | DELAYED_RELEASE_TABLET | Freq: Every day | ORAL | Status: DC | PRN

## 2016-03-16 MED ORDER — MAGNESIUM CITRATE PO SOLN: 1.0000 | Freq: Once | ORAL | Status: DC | PRN

## 2016-03-16 MED ORDER — METOCLOPRAMIDE HCL 5 MG PO TABS: 5.0000 mg | ORAL_TABLET | Freq: Three times a day (TID) | ORAL | Status: DC | PRN

## 2016-03-16 MED ORDER — METOCLOPRAMIDE HCL 5 MG/ML IJ SOLN
5.0000 mg | Freq: Three times a day (TID) | INTRAMUSCULAR | Status: DC | PRN
  Administered 2016-03-17: 10 mg via INTRAVENOUS
  Filled 2016-03-16: qty 2

## 2016-03-16 MED ORDER — PROPOFOL 10 MG/ML IV BOLUS
INTRAVENOUS | Status: DC | PRN
  Administered 2016-03-16: 130 mg via INTRAVENOUS

## 2016-03-16 MED ORDER — ONDANSETRON HCL 4 MG PO TABS: 4.0000 mg | ORAL_TABLET | Freq: Four times a day (QID) | ORAL | Status: DC | PRN

## 2016-03-16 MED ORDER — DOCUSATE SODIUM 100 MG PO CAPS
100.0000 mg | ORAL_CAPSULE | Freq: Two times a day (BID) | ORAL | Status: DC
  Administered 2016-03-16 – 2016-03-19 (×8): 100 mg via ORAL
  Filled 2016-03-16 (×8): qty 1

## 2016-03-16 MED ORDER — ONDANSETRON HCL 4 MG/2ML IJ SOLN
INTRAMUSCULAR | Status: AC
  Filled 2016-03-16: qty 2

## 2016-03-16 MED ORDER — OXYCODONE HCL 5 MG PO TABS: 5.0000 mg | ORAL_TABLET | Freq: Once | ORAL | Status: DC | PRN

## 2016-03-16 MED ORDER — PIPERACILLIN-TAZOBACTAM 3.375 G IVPB
3.3750 g | Freq: Three times a day (TID) | INTRAVENOUS | Status: DC
  Filled 2016-03-16 (×3): qty 50

## 2016-03-16 MED ORDER — PIPERACILLIN-TAZOBACTAM 3.375 G IVPB
3.3750 g | Freq: Three times a day (TID) | INTRAVENOUS | Status: DC
  Administered 2016-03-16 – 2016-03-20 (×13): 3.375 g via INTRAVENOUS
  Filled 2016-03-16 (×14): qty 50

## 2016-03-16 MED ORDER — SODIUM CHLORIDE 0.9 % IR SOLN
Status: DC | PRN
  Administered 2016-03-16: 3000 mL

## 2016-03-16 MED ORDER — METHOCARBAMOL 500 MG PO TABS: 500.0000 mg | ORAL_TABLET | Freq: Four times a day (QID) | ORAL | Status: DC | PRN

## 2016-03-16 MED ORDER — CHLORHEXIDINE GLUCONATE 4 % EX LIQD: 60.0000 mL | Freq: Once | CUTANEOUS | Status: DC

## 2016-03-16 MED ORDER — OXYCODONE HCL 5 MG PO TABS: 5.0000 mg | ORAL_TABLET | ORAL | Status: DC | PRN

## 2016-03-16 MED ORDER — KETOROLAC TROMETHAMINE 15 MG/ML IJ SOLN
7.5000 mg | Freq: Four times a day (QID) | INTRAMUSCULAR | Status: AC
  Administered 2016-03-16 (×2): 7.5 mg via INTRAVENOUS
  Filled 2016-03-16 (×2): qty 1

## 2016-03-16 MED ORDER — ONDANSETRON HCL 4 MG/2ML IJ SOLN: 4.0000 mg | Freq: Once | INTRAMUSCULAR | Status: DC | PRN

## 2016-03-16 MED ORDER — PROPOFOL 10 MG/ML IV BOLUS
INTRAVENOUS | Status: AC
  Filled 2016-03-16: qty 20

## 2016-03-16 MED ORDER — PHENYLEPHRINE 40 MCG/ML (10ML) SYRINGE FOR IV PUSH (FOR BLOOD PRESSURE SUPPORT)
PREFILLED_SYRINGE | INTRAVENOUS | Status: AC
  Filled 2016-03-16: qty 10

## 2016-03-16 MED ORDER — LEVOTHYROXINE SODIUM 50 MCG PO TABS
50.0000 ug | ORAL_TABLET | Freq: Every day | ORAL | Status: DC
  Administered 2016-03-17 – 2016-03-20 (×4): 50 ug via ORAL
  Filled 2016-03-16 (×4): qty 1

## 2016-03-16 MED ORDER — FAMOTIDINE 20 MG PO TABS
10.0000 mg | ORAL_TABLET | Freq: Every day | ORAL | Status: DC
  Administered 2016-03-16 – 2016-03-19 (×4): 10 mg via ORAL
  Filled 2016-03-16 (×5): qty 1

## 2016-03-16 MED ORDER — LACTATED RINGERS IV SOLN
INTRAVENOUS | Status: DC | PRN
  Administered 2016-03-16: 08:00:00 via INTRAVENOUS

## 2016-03-16 MED ORDER — HYDROMORPHONE HCL 2 MG/ML IJ SOLN: 1.0000 mg | INTRAMUSCULAR | Status: DC | PRN

## 2016-03-16 MED ORDER — ACETAMINOPHEN 650 MG RE SUPP: 650.0000 mg | Freq: Four times a day (QID) | RECTAL | Status: DC | PRN

## 2016-03-16 MED ORDER — CEFAZOLIN SODIUM-DEXTROSE 2-4 GM/100ML-% IV SOLN
2.0000 g | INTRAVENOUS | Status: AC
  Administered 2016-03-16: 2 g via INTRAVENOUS
  Filled 2016-03-16: qty 100

## 2016-03-16 MED ORDER — SODIUM CHLORIDE 0.9 % IV SOLN: INTRAVENOUS | Status: DC

## 2016-03-16 MED ORDER — ONDANSETRON HCL 4 MG/2ML IJ SOLN
4.0000 mg | Freq: Four times a day (QID) | INTRAMUSCULAR | Status: DC | PRN
  Administered 2016-03-17: 4 mg via INTRAVENOUS
  Filled 2016-03-16: qty 2

## 2016-03-16 MED ORDER — FENTANYL CITRATE (PF) 100 MCG/2ML IJ SOLN
INTRAMUSCULAR | Status: DC | PRN
  Administered 2016-03-16 (×4): 25 ug via INTRAVENOUS

## 2016-03-16 MED ORDER — OXYCODONE HCL 5 MG/5ML PO SOLN: 5.0000 mg | Freq: Once | ORAL | Status: DC | PRN

## 2016-03-16 MED ORDER — FENTANYL CITRATE (PF) 100 MCG/2ML IJ SOLN
INTRAMUSCULAR | Status: AC
  Filled 2016-03-16: qty 2

## 2016-03-16 MED ORDER — FENTANYL CITRATE (PF) 100 MCG/2ML IJ SOLN: 25.0000 ug | INTRAMUSCULAR | Status: DC | PRN

## 2016-03-16 MED ORDER — ACETAMINOPHEN 325 MG PO TABS
650.0000 mg | ORAL_TABLET | Freq: Four times a day (QID) | ORAL | Status: DC | PRN
  Administered 2016-03-17 – 2016-03-20 (×7): 650 mg via ORAL
  Filled 2016-03-16 (×7): qty 2

## 2016-03-16 MED ORDER — LIDOCAINE HCL (CARDIAC) 20 MG/ML IV SOLN
INTRAVENOUS | Status: DC | PRN
  Administered 2016-03-16: 50 mg via INTRAVENOUS

## 2016-03-16 MED ORDER — TRANEXAMIC ACID 1000 MG/10ML IV SOLN
INTRAVENOUS | Status: DC | PRN
  Administered 2016-03-16: 2000 mg via INTRAVENOUS

## 2016-03-16 MED ORDER — 0.9 % SODIUM CHLORIDE (POUR BTL) OPTIME
TOPICAL | Status: DC | PRN
  Administered 2016-03-16: 1000 mL

## 2016-03-16 MED ORDER — POLYETHYLENE GLYCOL 3350 17 G PO PACK: 17.0000 g | PACK | Freq: Every day | ORAL | Status: DC | PRN

## 2016-03-16 SURGICAL SUPPLY — 42 items
BLADE SURG 10 STRL SS (BLADE) IMPLANT
BLADE SURG 21 STRL SS (BLADE) ×3 IMPLANT
BNDG COHESIVE 4X5 TAN STRL (GAUZE/BANDAGES/DRESSINGS) IMPLANT
BNDG COHESIVE 6X5 TAN STRL LF (GAUZE/BANDAGES/DRESSINGS) ×2 IMPLANT
BNDG GAUZE ELAST 4 BULKY (GAUZE/BANDAGES/DRESSINGS) ×4 IMPLANT
CANISTER WOUND CARE 500ML ATS (WOUND CARE) ×2 IMPLANT
COVER SURGICAL LIGHT HANDLE (MISCELLANEOUS) ×3 IMPLANT
DRAPE INCISE IOBAN 66X45 STRL (DRAPES) ×9 IMPLANT
DRAPE U-SHAPE 47X51 STRL (DRAPES) ×3 IMPLANT
DRSG ADAPTIC 3X8 NADH LF (GAUZE/BANDAGES/DRESSINGS) ×3 IMPLANT
DRSG VAC ATS MED SENSATRAC (GAUZE/BANDAGES/DRESSINGS) ×2 IMPLANT
DURAPREP 26ML APPLICATOR (WOUND CARE) ×3 IMPLANT
ELECT CAUTERY BLADE 6.4 (BLADE) ×2 IMPLANT
ELECT REM PT RETURN 9FT ADLT (ELECTROSURGICAL) ×3
ELECTRODE REM PT RTRN 9FT ADLT (ELECTROSURGICAL) IMPLANT
GAUZE SPONGE 4X4 12PLY STRL (GAUZE/BANDAGES/DRESSINGS) ×3 IMPLANT
GLOVE BIOGEL PI IND STRL 9 (GLOVE) ×1 IMPLANT
GLOVE BIOGEL PI INDICATOR 9 (GLOVE) ×2
GLOVE SURG ORTHO 9.0 STRL STRW (GLOVE) ×3 IMPLANT
GOWN STRL REUS W/ TWL LRG LVL3 (GOWN DISPOSABLE) ×1 IMPLANT
GOWN STRL REUS W/ TWL XL LVL3 (GOWN DISPOSABLE) ×2 IMPLANT
GOWN STRL REUS W/TWL LRG LVL3 (GOWN DISPOSABLE) ×3
GOWN STRL REUS W/TWL XL LVL3 (GOWN DISPOSABLE) ×3
HANDPIECE INTERPULSE COAX TIP (DISPOSABLE)
KIT BASIN OR (CUSTOM PROCEDURE TRAY) ×3 IMPLANT
KIT ROOM TURNOVER OR (KITS) ×3 IMPLANT
MANIFOLD NEPTUNE II (INSTRUMENTS) ×3 IMPLANT
NS IRRIG 1000ML POUR BTL (IV SOLUTION) ×3 IMPLANT
PACK ORTHO EXTREMITY (CUSTOM PROCEDURE TRAY) ×3 IMPLANT
PAD ARMBOARD 7.5X6 YLW CONV (MISCELLANEOUS) ×6 IMPLANT
SET HNDPC FAN SPRY TIP SCT (DISPOSABLE) IMPLANT
STOCKINETTE IMPERVIOUS 9X36 MD (GAUZE/BANDAGES/DRESSINGS) ×2 IMPLANT
SWAB COLLECTION DEVICE MRSA (MISCELLANEOUS) ×3 IMPLANT
TOWEL OR 17X24 6PK STRL BLUE (TOWEL DISPOSABLE) ×3 IMPLANT
TOWEL OR 17X26 10 PK STRL BLUE (TOWEL DISPOSABLE) ×3 IMPLANT
TUBE ANAEROBIC SPECIMEN COL (MISCELLANEOUS) ×2 IMPLANT
TUBE CONNECTING 12'X1/4 (SUCTIONS) ×1
TUBE CONNECTING 12X1/4 (SUCTIONS) ×2 IMPLANT
TUBING VERAFLO DUO SET (SET/KITS/TRAYS/PACK) ×2 IMPLANT
UNDERPAD 30X30 (UNDERPADS AND DIAPERS) ×3 IMPLANT
WATER STERILE IRR 1000ML POUR (IV SOLUTION) ×3 IMPLANT
YANKAUER SUCT BULB TIP NO VENT (SUCTIONS) ×3 IMPLANT

## 2016-03-16 NOTE — Anesthesia Postprocedure Evaluation (Signed)
Anesthesia Post Note  Patient: Joel Macdonald  Procedure(s) Performed: Procedure(s) (LRB): Excise Wound Right Leg, Apply Veraflo VAC (Right)  Patient location during evaluation: PACU Anesthesia Type: General Level of consciousness: awake, awake and alert and oriented Pain management: pain level controlled Vital Signs Assessment: post-procedure vital signs reviewed and stable Respiratory status: spontaneous breathing, nonlabored ventilation and respiratory function stable Cardiovascular status: blood pressure returned to baseline Anesthetic complications: no    Last Vitals:  Vitals:   03/16/16 0927 03/16/16 1006  BP: (!) 143/80 140/73  Pulse: 73 66  Resp: 10   Temp: 36.7 C 36.4 C    Last Pain:  Vitals:   03/16/16 1006  TempSrc: Oral                 [name] COKER

## 2016-03-16 NOTE — Anesthesia Preprocedure Evaluation (Addendum)
Anesthesia Evaluation  Patient identified by MRN, date of birth, ID band Patient awake    Reviewed: Allergy & Precautions, NPO status , Patient's Chart, lab work & pertinent test results  Airway Mallampati: II  TM Distance: >3 FB Neck ROM: Full    Dental  (+) Dental Advisory Given, Teeth Intact   Pulmonary shortness of breath, former smoker,    breath sounds clear to auscultation       Cardiovascular + Peripheral Vascular Disease  + Valvular Problems/Murmurs  Rhythm:Regular Rate:Normal     Neuro/Psych  Neuromuscular disease    GI/Hepatic GERD  Controlled,  Endo/Other  Hypothyroidism   Renal/GU      Musculoskeletal  (+) Arthritis ,   Abdominal   Peds  Hematology  (+) Blood dyscrasia, ,   Anesthesia Other Findings   Reproductive/Obstetrics                           Anesthesia Physical Anesthesia Plan  ASA: III  Anesthesia Plan: General   Post-op Pain Management:    Induction:   Airway Management Planned: LMA  Additional Equipment:   Intra-op Plan:   Post-operative Plan:   Informed Consent: I have reviewed the patients History and Physical, chart, labs and discussed the procedure including the risks, benefits and alternatives for the proposed anesthesia with the patient or authorized representative who has indicated his/her understanding and acceptance.   Dental advisory given  Plan Discussed with: CRNA and Anesthesiologist  Anesthesia Plan Comments:         Anesthesia Quick Evaluation

## 2016-03-16 NOTE — Progress Notes (Signed)
Wound vac removed from right lateral calf as the area was bleeding from under the drape even with efforts to reinforce.  Current dressing in place is wet to dry with 3 ABD's and 2 kerlex dressings and breakthru bleeding is still noted on kerlex and on pad under leg. Call placed to Dr. Erlinda Hong.

## 2016-03-16 NOTE — Progress Notes (Signed)
Dressing to right leg noted to have approx 50 cent piece size bloody drainage on ace wrap located right leg, top lateral of ace wrap. Pt denies any pain or discomfort with leg. Current dressing is wet to dry telfa non-stick x4, ABD x4, kerlex x 2, ace wrap x2. Will continue to monitor.

## 2016-03-16 NOTE — Progress Notes (Signed)
Pt admitted to unit from PACU after I & D or venous ulcer on right lateral calf. Wound vac in place with irrigation occurring q 10 min and suction to follow. Dressing does not indicate any leaks according to machine with leak check performed however copious amounts of residual fluid /blood/saline accumulating under dressing and leaks out of posterior side from site of heel. Dressing reinforced as ordered. Call out to Dr Erlinda Hong on call for Dr. Sharol Given. Will continue to monitor .

## 2016-03-16 NOTE — H&P (Signed)
Joel Macdonald is an 82 y.o. male.   Chief Complaint: Nonhealing chronic venous stasis insufficiency ulcer right leg HPI: Patient is an 82 year old gentleman who is undergone extensive wound care treatment to the right leg ulcer. Patient had interval healing however at this time he has increasing necrosis to the wound bed.  Past Medical History:  Diagnosis Date  . Abscess of skin 11/17/2014   Left ankle: incise & drain 11/17/14  . Cellulitis and abscess of leg 05/12/2015   rt leg  . Cellulitis of leg, right 07/12/2013  . Dyspnea   . Edema, peripheral 03/21/2014  . GERD (gastroesophageal reflux disease)   . Heart murmur 1936  . History of blood transfusion    last episode- 04/2015  . Hypothyroidism   . Inguinal hernia    reducible - on R side   . Ischemia of extremity 07/12/2013   Bilateral toes  2,3,4 right; 1,3 left  Vascular doppler normal for large vessel occlusion 2/15  . Lymphedema    R leg- in the past   . MDS (myelodysplastic syndrome), high grade ([place]) 06/13/2014   7% blasts BM bx 05/05/14  . MDS (myelodysplastic syndrome), low grade ([place]) 09/20/2011   Bone marrow bx 06/13/11: mild dyserythro/dymegakaryopoiesis.  Normal cytogenetics.  FISH negative for chrom 5 or 7 deletions.  Scattered small infiltrates of plasmacytoid lymphs  . MGUS (monoclonal gammopathy of unknown significance) 06/17/2011  . Monocytosis 03/21/2014  . Osteoarthritis    "back, legs, arms, shoulders" (07/12/2013)  . Pancytopenia, acquired ([place]) 06/17/2011   "from my Myelodysplastic syndrome"  . Prostate cancer ([place])   . Senile purpura ([place]) 10/18/2014    Past Surgical History:  Procedure Laterality Date  . APPLICATION OF WOUND VAC Right 05/17/2015   Procedure: VAC placement;  Surgeon: Newt Minion, MD;  Location: [facility];  Service: Orthopedics;  Laterality: Right;  . CATARACT EXTRACTION W/ INTRAOCULAR LENS  IMPLANT, BILATERAL Bilateral ~ 2012  . I&D EXTREMITY Right 05/13/2015   Procedure: IRRIGATION AND DEBRIDEMENT  ANKLE;  Surgeon: Newt Minion, MD;  Location: [facility];  Service: Orthopedics;  Laterality: Right;  . I&D EXTREMITY Right 05/17/2015   Procedure: IRRIGATION AND DEBRIDEMENT RIGHT LEG, APPLY SKIN GRAFT ;  Surgeon: Newt Minion, MD;  Location: [facility];  Service: Orthopedics;  Laterality: Right;  . ROBOT ASSISTED LAPAROSCOPIC RADICAL PROSTATECTOMY  ~ 2010  . SKIN SPLIT GRAFT Right 12/06/2015   Procedure: APPLY SKIN GRAFT SPLIT THICKNESS TO RIGHT LEG;  Surgeon: Newt Minion, MD;  Location: [facility];  Service: Orthopedics;  Laterality: Right;  . TONSILLECTOMY AND ADENOIDECTOMY  ~ 1939    Family History  Problem Relation Age of Onset  . Other Mother   . Other Father   . Heart disease Father   . Hyperlipidemia Son    Social History:  reports that he quit smoking about 22 years ago. His smoking use included Pipe. He quit after 40.00 years of use. He has never used smokeless tobacco. He reports that he drinks about 4.2 oz of alcohol per week . He reports that he does not use drugs.  Allergies:  Allergies  Allergen Reactions  . Doxycycline Nausea And Vomiting    Upset stomach   . Erythromycin     Minor upset stomach - pt tolerates med ok   . Other Other (See Comments)    Ragweed causes nasal congestion    Medications Prior to Admission  Medication Sig Dispense Refill  . acetaminophen (TYLENOL) 500 MG tablet  Take 1,000 mg by mouth every 6 (six) hours as needed for moderate pain or headache.    Marland Kitchen aspirin 325 MG tablet Take 650 mg by mouth daily as needed for moderate pain.     . Aspirin Effervescent (ALKA-SELTZER EXTRA STRENGTH) 500 MG TBEF Take 1,000 mg by mouth every 4 (four) hours as needed (pain/ indigestion/ heartburn).    . fluticasone (FLONASE) 50 MCG/ACT nasal spray Place 1 spray into both nostrils daily as needed (seasonal allergies).    Marland Kitchen ibuprofen (ADVIL,MOTRIN) 200 MG tablet Take 400 mg by mouth every 6 (six) hours as needed for mild pain (pain).     Marland Kitchen levothyroxine (SYNTHROID) 50 MCG  tablet Take 44mcg every morning on an empty stomach    . Multiple Vitamin (MULTIVITAMIN WITH MINERALS) TABS tablet Take 1 tablet by mouth daily.    . ranitidine (ZANTAC) 150 MG tablet Take 150 mg by mouth daily as needed for heartburn.    . silver sulfADIAZINE (SILVADENE) 1 % cream Apply 1 application topically as directed.     . triamcinolone cream (KENALOG) 0.1 % Apply 1 application topically daily as needed (wound care/ dry skin). Reported on 06/26/2015    . doxycycline (VIBRA-TABS) 100 MG tablet Take 1 tablet (100 mg total) by mouth 2 (two) times daily. (Patient not taking: Reported on 03/14/2016) 60 tablet 0    No results found for this or any previous visit (from the past 48 hour(s)). No results found.  Review of Systems  All other systems reviewed and are negative.   There were no vitals taken for this visit. Physical Exam  Examination patient is alert oriented no adenopathy well-dressed normal affect normal story effort he does have an antalgic gait. Examination the wound bed is completely necrotic. The wound is approximately 10 x 20 cm. There is no ascending cellulitis he has venous stasis changes with brawny skin color changes in both legs. No arterial insufficiency. Assessment/Plan Assessment: Necrotic venous stasis ulcer right leg.  Plan: We'll plan for excisional debridement of the eschar placement of a vera flow wound VAC placement on IV antibiotics return to the operating room on Wednesday for further debridement and evaluation for skin grafting  Newt Minion, MD 03/16/2016, 7:32 AM

## 2016-03-16 NOTE — Transfer of Care (Signed)
Immediate Anesthesia Transfer of Care Note  Patient: Joel Macdonald  Procedure(s) Performed: Procedure(s): Excise Wound Right Leg, Apply Veraflo VAC (Right)  Patient Location: PACU  Anesthesia Type:General  Level of Consciousness: awake and alert   Airway & Oxygen Therapy: Patient Spontanous Breathing and Patient connected to nasal cannula oxygen  Post-op Assessment: Report given to RN, Post -op Vital signs reviewed and stable and Patient moving all extremities X 4  Post vital signs: Reviewed and stable  Last Vitals:  Vitals:   03/16/16 0739  BP: (!) 152/77  Pulse: 74  Resp: 16  Temp: 36.8 C    Last Pain:  Vitals:   03/16/16 0739  TempSrc: Oral      Patients Stated Pain Goal: 5 ([id number])  Complications: No apparent anesthesia complications

## 2016-03-16 NOTE — Anesthesia Procedure Notes (Signed)
Procedure Name: LMA Insertion Date/Time: 03/16/2016 7:56 AM Performed by: Rejeana Brock L Pre-anesthesia Checklist: Patient identified, Emergency Drugs available, Suction available and Patient being monitored Patient Re-evaluated:Patient Re-evaluated prior to inductionOxygen Delivery Method: Circle System Utilized Preoxygenation: Pre-oxygenation with 100% oxygen Intubation Type: IV induction Ventilation: Mask ventilation without difficulty LMA: LMA inserted LMA Size: 4.0 Number of attempts: 1 Airway Equipment and Method: Bite block Placement Confirmation: positive ETCO2 Tube secured with: Tape Dental Injury: Teeth and Oropharynx as per pre-operative assessment

## 2016-03-16 NOTE — Progress Notes (Signed)
Pharmacy Antibiotic Note  Joel Macdonald is a 81 y.o. male admitted on 03/16/2016 s/p excision of right necrotic leg wound and application of Veraflo VAC on 12/9.  Pharmacy has been consulted for vancomycin dosing for cellulitis. He received cefazolin 2 g IV pre-op at 08:00. No BMET for today but last SCr in August 2017 was 0.96, norm CrCl ~ 59 ml/min.  Plan: Vancomycin 1500 mg IV every 24 hours.  Goal trough 10-15 mcg/mL.  BMET with am labs Monitor renal function, clinical progress, and culture data Vancomycin trough as clinically indicated     Temp (24hrs), Avg:97.9 F (36.6 C), Min:97.5 F (36.4 C), Max:98.3 F (36.8 C)   Recent Labs Lab 03/16/16 0734  WBC 3.1*    CrCl cannot be calculated (Patient's most recent lab result is older than the maximum 21 days allowed.).    Allergies  Allergen Reactions  . Doxycycline Nausea And Vomiting    Upset stomach   . Erythromycin     Minor upset stomach - pt tolerates med ok   . Other Other (See Comments)    Ragweed causes nasal congestion    Antimicrobials this admission: Vancomycin 12/9 >>  Cefazolin 2g x1 12/9  Dose adjustments this admission:   Microbiology results: 12/9 Wound cx:  Thank you for allowing pharmacy to be a part of this patient's care.  Renold Genta, PharmD, BCPS Clinical Pharmacist Phone for today - [facility] - [phone] 03/16/2016 10:15 AM

## 2016-03-16 NOTE — Progress Notes (Signed)
Patient refused to have belongings locked up with security. Explained to patient that we could not be responsible for lost belongings. Patient verbalized understanding.

## 2016-03-16 NOTE — Progress Notes (Signed)
Dressing to right leg rewrapped as ordered. Will continue to observe for any breakthru bleeding on dressings.

## 2016-03-16 NOTE — Progress Notes (Signed)
Assisted primary RN with reinforcing dressing for wound vac.  Dr. Erlinda Hong paged.  Dr. Erlinda Hong called back, stated he would enter order for wet to dry dressing change.  Currently awaiting order.  Will continue to monitor patient.

## 2016-03-16 NOTE — Progress Notes (Signed)
Dr. Erlinda Hong request that leg be rewraped as initially ordered with wet to dry, ABD, kerlex and apply ace wrap snuggly.

## 2016-03-16 NOTE — Op Note (Signed)
03/16/2016  9:08 AM  PATIENT:  Joel Macdonald    PRE-OPERATIVE DIAGNOSIS:  Right Leg Necrotic Wound  POST-OPERATIVE DIAGNOSIS:  Same  PROCEDURE:  Excise Wound Right Leg, removal skin soft tissue and muscle with a 21 blade knife, Apply Veraflo VAC  SURGEON:  Newt Minion, MD  PHYSICIAN ASSISTANT:None ANESTHESIA:   General  PREOPERATIVE INDICATIONS:  Joel Macdonald is a  82 y.o. male with a diagnosis of Right Leg Necrotic Wound who failed conservative measures and elected for surgical management.    The risks benefits and alternatives were discussed with the patient preoperatively including but not limited to the risks of infection, bleeding, nerve injury, cardiopulmonary complications, the need for revision surgery, among others, and the patient was willing to proceed.  OPERATIVE IMPLANTS: Vera flow wound VAC  OPERATIVE FINDINGS: Wound 11 x 15 cm. Cultures obtained 2. Wounds soaked in TXA  OPERATIVE PROCEDURE: Patient brought to operating room and underwent general anesthetic. After adequate anesthesia obtained patient's right lower extremity was prepped using DuraPrep draped into a sterile field a timeout was called. Patient had necrotic tissue over the lateral venous stasis ulcer right leg. A 21 blade knife was used to excise the skin soft tissue muscle VAC to bleeding viable granulation tissue. The wound was irrigated with pulsatile lavage. Cultures were obtained 2. The wound was then soaked in TXA 2 g topical. A infusion wound VAC was applied this was set for 10 mL of instillation normal saline. This had a good suction fit patient was extubated taken to the PACU in stable condition.

## 2016-03-17 ENCOUNTER — Encounter (HOSPITAL_COMMUNITY): Payer: Self-pay | Admitting: Orthopedic Surgery

## 2016-03-17 LAB — BASIC METABOLIC PANEL
Anion gap: 5 (ref 5–15)
BUN: 21 mg/dL — AB (ref 6–20)
CHLORIDE: 104 mmol/L (ref 101–111)
CO2: 27 mmol/L (ref 22–32)
Calcium: 8.1 mg/dL — ABNORMAL LOW (ref 8.9–10.3)
Creatinine, Ser: 1.22 mg/dL (ref 0.61–1.24)
GFR calc Af Amer: >60 mL/min (ref >60)
GFR, EST NON AFRICAN AMERICAN: 53 mL/min — AB (ref >60)
GLUCOSE: 97 mg/dL (ref 65–99)
POTASSIUM: 3.5 mmol/L (ref 3.5–5.1)
Sodium: 136 mmol/L (ref 135–145)

## 2016-03-17 LAB — CBC
HEMATOCRIT: 16.1 % — AB (ref 39.0–52.0)
HEMOGLOBIN: 5.3 g/dL — AB (ref 13.0–17.0)
MCH: 32.9 pg (ref 26.0–34.0)
MCHC: 32.9 g/dL (ref 30.0–36.0)
MCV: 100 fL (ref 78.0–100.0)
Platelets: 94 10*3/uL — ABNORMAL LOW (ref 150–400)
RBC: 1.61 MIL/uL — ABNORMAL LOW (ref 4.22–5.81)
RDW: 17.9 % — AB (ref 11.5–15.5)
WBC: 1.9 10*3/uL — ABNORMAL LOW (ref 4.0–10.5)

## 2016-03-17 LAB — PREPARE RBC (CROSSMATCH)

## 2016-03-17 MED ORDER — SODIUM CHLORIDE 0.9 % IV SOLN: Freq: Once | INTRAVENOUS | Status: DC

## 2016-03-17 NOTE — Progress Notes (Signed)
   Subjective:  Patient reports pain as mild.  VAC failed yesterday.  Objective:   VITALS:   Vitals:   03/16/16 1443 03/16/16 2101 03/17/16 0445 03/17/16 0852  BP: (!) 117/59 (!) 107/47 (!) 134/54 125/64  Pulse: 79 80 85 79  Resp:  16  (!) 22  Temp: 98.2 F (36.8 C) 98.4 F (36.9 C) 98.2 F (36.8 C) 97.8 F (36.6 C)  TempSrc: Oral Oral Oral Oral  SpO2: 100% 100% 100% 100%    Neurologically intact Neurovascular intact Sensation intact distally Intact pulses distally Dorsiflexion/Plantar flexion intact Incision: dressing C/D/I and no drainage No cellulitis present Compartment soft   Lab Results  Component Value Date   WBC 1.9 (L) 03/17/2016   HGB 5.3 (LL) 03/17/2016   HCT 16.1 (L) 03/17/2016   MCV 100.0 03/17/2016   PLT 94 (L) 03/17/2016     Assessment/Plan:  1 Day Post-Op   - wet to dry dressings for now BID - Dr. Sharol Given to evaluate tomorrow - VAC failed - transfuse 3 units of RBCs  Eduard Roux 03/17/2016, 9:01 AM [phone]

## 2016-03-17 NOTE — Progress Notes (Signed)
PT Cancellation Note  Patient Details Name: Joel Macdonald MRN: [id number] DOB: 07-Jul-1930   Cancelled Treatment:    Reason Eval/Treat Not Completed: Patient declined. Pt had HGB of 5.3 this AM and has received 2 units of blood and is awaiting a third. Pt is requesting to hold off on PT evaluation until tomorrow. Pt to see Dr. Sharol Given in the AM to reassess wound.     Scheryl Marten PT, DPT  [phone]  03/17/2016, 3:11 PM

## 2016-03-17 NOTE — Progress Notes (Signed)
CRITICAL VALUE ALERT  Critical value received:  5.3 hemoglobin  Date of notification:  03/17/2016  Time of notification:  0430  Critical value read back:Yes.    Nurse who received alert:  L Hitt RN  MD notified (1st page):  MD XU  Time of first page:  [phone]  MD notified (2nd page): MD XU  Time of second page: 0505  Responding MD:  Erlinda Hong  Time MD responded:  0509  New orders received, patient awake and oriented, VSS at this time, denies SOB, chest pain or dizziness. Will continue to monitor.

## 2016-03-18 LAB — CBC WITH DIFFERENTIAL/PLATELET
BASOS PCT: 0 %
Basophils Absolute: 0 10*3/uL (ref 0.0–0.1)
EOS PCT: 0 %
Eosinophils Absolute: 0 10*3/uL (ref 0.0–0.7)
HEMATOCRIT: 22.6 % — AB (ref 39.0–52.0)
HEMOGLOBIN: 7.7 g/dL — AB (ref 13.0–17.0)
LYMPHS PCT: 32 %
Lymphs Abs: 0.8 10*3/uL (ref 0.7–4.0)
MCH: 32 pg (ref 26.0–34.0)
MCHC: 34.1 g/dL (ref 30.0–36.0)
MCV: 93.8 fL (ref 78.0–100.0)
MONOS PCT: 48 %
Monocytes Absolute: 1.1 10*3/uL — ABNORMAL HIGH (ref 0.1–1.0)
NEUTROS ABS: 0.5 10*3/uL — AB (ref 1.7–7.7)
Neutrophils Relative %: 20 %
Platelets: 95 10*3/uL — ABNORMAL LOW (ref 150–400)
RBC: 2.41 MIL/uL — ABNORMAL LOW (ref 4.22–5.81)
RDW: 20.2 % — ABNORMAL HIGH (ref 11.5–15.5)
WBC: 2.4 10*3/uL — ABNORMAL LOW (ref 4.0–10.5)

## 2016-03-18 LAB — PATHOLOGIST SMEAR REVIEW

## 2016-03-18 NOTE — Consult Note (Signed)
Joel Macdonald Nurse wound consult note Reason for Consult: apply instillation NPWT VAC therapy to right leg wound. Wound type: surgical  Measurement: 11cm x 15cm per Dr. Jess Barters surgical note 03/16/16 Wound bed: very shallow with some dark, clotted blood along the most lateral edge of the wound Drainage (amount, consistency, odor) serosanguinous  Periwound: intact but skin appears fragile Dressing procedure/placement/frequency: Periwound skin protected with thin hydrocolloid 1pc of black foam used to cover the wound bed Drape applied; NPWT applied with 53min soak time, 10ml NS, continuous therapy for 2 hours.    Per patient he is scheduled to go back to the OR for skin graft placement on Wednesday this week. Will leave dressing in place until removed in the OR.    Notified bedside nursing staff of placement and that if patient should have >100CC of bright red blood in the canister in an hour to DC NPWT and to replace with saline moist gauze.  Discussed POC with patient and bedside nurse.  Re consult if needed, will not follow at this time. Thanks  Joel Macdonald [employer], RN,CWOCN, CNS [phone])

## 2016-03-18 NOTE — Evaluation (Signed)
Physical Therapy Evaluation Patient Details Name: Joel Macdonald MRN: [id number] DOB: April 11, 1930 Today's Date: 03/18/2016   History of Present Illness  82 year old gentleman who is undergone extensive wound care treatment to the right leg ulcer admitted with rt leg necrotic wound. Pt no s/p excision of wound on 03/16/16. Probable skin graft scheduled for 03/20/16. PMH: prostate cancer, lymphedema, dyspnea.   Clinical Impression  Pt mobilizing well during initial PT session. Pt able to ambulate 151ft without an assistive device. Pt denies any increased pain or dizziness. Pt does have mild antalgic gait pattern but no loss of balance noted. Will continue to follow the pt acutely to progress mobility in anticipation of D/C to home.    Follow Up Recommendations No PT follow up;Supervision - Intermittent    Equipment Recommendations  None recommended by PT    Recommendations for Other Services       Precautions / Restrictions Restrictions Weight Bearing Restrictions: No      Mobility  Bed Mobility Overal bed mobility: Independent             General bed mobility comments: supine<>sit with HOB elevated  Transfers Overall transfer level: Needs assistance Equipment used: None Transfers: Sit to/from Stand Sit to Stand: Supervision         General transfer comment: supervision for safety  Ambulation/Gait Ambulation/Gait assistance: Supervision Ambulation Distance (Feet): 120 Feet Assistive device: None Gait Pattern/deviations: Step-through pattern;Decreased weight shift to right (antalgic pattern through Rt LE) Gait velocity: decreased      Stairs            Wheelchair Mobility    Modified Rankin (Stroke Patients Only)       Balance Overall balance assessment: Needs assistance Sitting-balance support: No upper extremity supported Sitting balance-Leahy Scale: Normal     Standing balance support: No upper extremity supported Standing balance-Leahy  Scale: Fair                               Pertinent Vitals/Pain Pain Assessment: No/denies pain    Home Living Family/patient expects to be discharged to:: Private residence Living Arrangements: Spouse/significant other Available Help at Discharge: Family;Available 24 hours/day Type of Home: House Home Access: Stairs to enter Entrance Stairs-Rails: None Entrance Stairs-Number of Steps: 1 Home Layout: Two level Home Equipment: Cane - single point      Prior Function Level of Independence: Independent               Hand Dominance        Extremity/Trunk Assessment   Upper Extremity Assessment: Overall WFL for tasks assessed           Lower Extremity Assessment: Overall WFL for tasks assessed         Communication   Communication: No difficulties  Cognition Arousal/Alertness: Awake/alert Behavior During Therapy: WFL for tasks assessed/performed Overall Cognitive Status: Within Functional Limits for tasks assessed                      General Comments      Exercises     Assessment/Plan    PT Assessment Patient needs continued PT services  PT Problem List Decreased strength;Decreased range of motion;Decreased activity tolerance;Decreased mobility;Decreased balance          PT Treatment Interventions DME instruction;Gait training;Stair training;Functional mobility training;Therapeutic activities;Therapeutic exercise;Patient/family education    PT Goals (Current goals can be found in the Care Plan section)  Acute Rehab PT Goals Patient Stated Goal: get back home, get leg to heal PT Goal Formulation: With patient Time For Goal Achievement: 04/01/16 Potential to Achieve Goals: Good    Frequency Min 2X/week   Barriers to discharge        Co-evaluation               End of Session   Activity Tolerance: Patient tolerated treatment well Patient left: in bed;with call bell/phone within reach (per pt request) Nurse  Communication: Mobility status    Functional Assessment Tool Used: clinical judgment Functional Limitation: Mobility: Walking and moving around Mobility: Walking and Moving Around Current Status [id number]): At least 20 percent but less than 40 percent impaired, limited or restricted Mobility: Walking and Moving Around Goal Status [phone]): At least 1 percent but less than 20 percent impaired, limited or restricted    Time: 0951-1019 PT Time Calculation (min) (ACUTE ONLY): 28 min   Charges:   PT Evaluation $PT Eval Low Complexity: 1 Procedure PT Treatments $Gait Training: 8-22 mins   PT G Codes:   PT G-Codes **NOT FOR INPATIENT CLASS** Functional Assessment Tool Used: clinical judgment Functional Limitation: Mobility: Walking and moving around Mobility: Walking and Moving Around Current Status [id number]): At least 20 percent but less than 40 percent impaired, limited or restricted Mobility: Walking and Moving Around Goal Status [phone]): At least 1 percent but less than 20 percent impaired, limited or restricted    Cassell Clement, PT, CSCS Pager [phone] Office [phone]  03/18/2016, 10:34 AM

## 2016-03-18 NOTE — Progress Notes (Signed)
Patient's wound vac drained 20mL.

## 2016-03-18 NOTE — Progress Notes (Addendum)
Patient ID: Joel Macdonald, male   DOB: 05-06-1930, 82 y.o.   MRN: [id number] Slight improvement in patient's hemoglobin white cell count and platelet count. WBC 2.4, hemoglobin 7.7, platelets 95,000. Patient's anemia primarily due to the chronic disease of his myelodysplastic syndrome. Orders written for wound ostomy continence nursing to reapply infusion wound VAC. If this is not possible we will set him up for hydrotherapy daily. Continue IV vancomycin and Zosyn. Plan for surgery with split-thickness skin graft on Wednesday. Discharge on Thursday.  Patient had acute on chronic blood loss anemia.

## 2016-03-19 ENCOUNTER — Other Ambulatory Visit (INDEPENDENT_AMBULATORY_CARE_PROVIDER_SITE_OTHER): Payer: Self-pay | Admitting: Orthopedic Surgery

## 2016-03-19 NOTE — Progress Notes (Signed)
Patient ID: Joel Macdonald, male   DOB: 22-Jun-1930, 82 y.o.   MRN: [id number] Patient's cultures were positive for Pseudomonas. Currently on Zosyn, and vancomycin has been discontinued. Wound ostomy and continence nursing  has replace the infusion wound VAC and this is functioning well. Plan for return to the operating room tomorrow with placement of split-thickness skin graft and placement of a new wound VAC and discharged to home after surgery. Plan for discharge on Cipro.

## 2016-03-19 NOTE — Progress Notes (Signed)
PT Cancellation Note  Patient Details Name: Joel Macdonald MRN: [id number] DOB: 1931/03/22   Cancelled Treatment:     Pt reports he walked today and yesterday and at this time he prefers to rest before his surgery in the am.     Cristela Blue 03/19/2016, 4:09 PM  Governor Rooks, PTA pager [phone]

## 2016-03-20 ENCOUNTER — Encounter (HOSPITAL_COMMUNITY): Payer: Self-pay | Admitting: Certified Registered"

## 2016-03-20 ENCOUNTER — Inpatient Hospital Stay (HOSPITAL_COMMUNITY): Payer: Medicare Other | Admitting: Certified Registered"

## 2016-03-20 ENCOUNTER — Encounter (HOSPITAL_COMMUNITY)
Admission: RE | Disposition: A | Discharge status: Home / Self Care | Payer: Self-pay | Source: Ambulatory Visit | Attending: Orthopedic Surgery

## 2016-03-20 HISTORY — PX: SKIN SPLIT GRAFT: SHX444

## 2016-03-20 LAB — SURGICAL PCR SCREEN
MRSA, PCR: NEGATIVE
Staphylococcus aureus: POSITIVE — AB

## 2016-03-20 LAB — POCT I-STAT 4, (NA,K, GLUC, HGB,HCT)
GLUCOSE: 90 mg/dL (ref 65–99)
HEMATOCRIT: 26 % — AB (ref 39.0–52.0)
Hemoglobin: 8.8 g/dL — ABNORMAL LOW (ref 13.0–17.0)
POTASSIUM: 3.6 mmol/L (ref 3.5–5.1)
Sodium: 139 mmol/L (ref 135–145)

## 2016-03-20 SURGERY — APPLICATION, GRAFT, SKIN, SPLIT-THICKNESS
Anesthesia: General | Site: Leg Lower | Laterality: Right

## 2016-03-20 MED ORDER — FENTANYL CITRATE (PF) 100 MCG/2ML IJ SOLN
INTRAMUSCULAR | Status: DC | PRN
  Administered 2016-03-20 (×4): 25 ug via INTRAVENOUS

## 2016-03-20 MED ORDER — MUPIROCIN 2 % EX OINT
TOPICAL_OINTMENT | CUTANEOUS | Status: AC
  Filled 2016-03-20: qty 22

## 2016-03-20 MED ORDER — OXYCODONE HCL 5 MG PO TABS: 5.0000 mg | ORAL_TABLET | Freq: Once | ORAL | Status: DC | PRN

## 2016-03-20 MED ORDER — ONDANSETRON HCL 4 MG/2ML IJ SOLN
INTRAMUSCULAR | Status: AC
  Filled 2016-03-20: qty 2

## 2016-03-20 MED ORDER — MINERAL OIL LIGHT 100 % EX OIL
TOPICAL_OIL | CUTANEOUS | Status: AC
  Filled 2016-03-20: qty 25

## 2016-03-20 MED ORDER — OXYCODONE-ACETAMINOPHEN 5-325 MG PO TABS: 1.0000 | ORAL_TABLET | ORAL | 0 refills | Status: AC | PRN

## 2016-03-20 MED ORDER — FENTANYL CITRATE (PF) 100 MCG/2ML IJ SOLN
INTRAMUSCULAR | Status: AC
  Filled 2016-03-20: qty 2

## 2016-03-20 MED ORDER — LACTATED RINGERS IV SOLN
INTRAVENOUS | Status: DC
  Administered 2016-03-20: 10:00:00 via INTRAVENOUS

## 2016-03-20 MED ORDER — FENTANYL CITRATE (PF) 100 MCG/2ML IJ SOLN: 25.0000 ug | INTRAMUSCULAR | Status: DC | PRN

## 2016-03-20 MED ORDER — OXYCODONE HCL 5 MG/5ML PO SOLN: 5.0000 mg | Freq: Once | ORAL | Status: DC | PRN

## 2016-03-20 MED ORDER — PROPOFOL 10 MG/ML IV BOLUS
INTRAVENOUS | Status: DC | PRN
  Administered 2016-03-20: 150 mg via INTRAVENOUS

## 2016-03-20 MED ORDER — SULFAMETHOXAZOLE-TRIMETHOPRIM 800-160 MG PO TABS: 1.0000 | ORAL_TABLET | Freq: Two times a day (BID) | ORAL | 0 refills | Status: AC

## 2016-03-20 MED ORDER — 0.9 % SODIUM CHLORIDE (POUR BTL) OPTIME
TOPICAL | Status: DC | PRN
  Administered 2016-03-20: 2000 mL

## 2016-03-20 MED ORDER — ONDANSETRON HCL 4 MG/2ML IJ SOLN
INTRAMUSCULAR | Status: DC | PRN
  Administered 2016-03-20: 4 mg via INTRAVENOUS

## 2016-03-20 SURGICAL SUPPLY — 47 items
APL SKNCLS STERI-STRIP NONHPOA (GAUZE/BANDAGES/DRESSINGS) ×2
BENZOIN TINCTURE PRP APPL 2/3 (GAUZE/BANDAGES/DRESSINGS) ×6 IMPLANT
BNDG CMPR 9X4 STRL LF SNTH (GAUZE/BANDAGES/DRESSINGS) ×1
BNDG COHESIVE 6X5 TAN STRL LF (GAUZE/BANDAGES/DRESSINGS) IMPLANT
BNDG ESMARK 4X9 LF (GAUZE/BANDAGES/DRESSINGS) ×3 IMPLANT
BNDG GAUZE STRTCH 6 (GAUZE/BANDAGES/DRESSINGS) IMPLANT
COVER SURGICAL LIGHT HANDLE (MISCELLANEOUS) ×6 IMPLANT
CUFF TOURNIQUET SINGLE 18IN (TOURNIQUET CUFF) IMPLANT
CUFF TOURNIQUET SINGLE 24IN (TOURNIQUET CUFF) IMPLANT
DERMACARRIERS GRAFT 1 TO 1.5 (DISPOSABLE)
DRAPE INCISE IOBAN 85X60 (DRAPES) ×2 IMPLANT
DRAPE U-SHAPE 47X51 STRL (DRAPES) ×3 IMPLANT
DRSG ADAPTIC 3X8 NADH LF (GAUZE/BANDAGES/DRESSINGS) IMPLANT
DRSG MEPITEL 4X7.2 (GAUZE/BANDAGES/DRESSINGS) ×3 IMPLANT
DRSG VAC ATS MED SENSATRAC (GAUZE/BANDAGES/DRESSINGS) ×2 IMPLANT
DURAPREP 26ML APPLICATOR (WOUND CARE) ×3 IMPLANT
ELECT REM PT RETURN 9FT ADLT (ELECTROSURGICAL) ×3
ELECTRODE REM PT RTRN 9FT ADLT (ELECTROSURGICAL) ×1 IMPLANT
GAUZE SPONGE 4X4 12PLY STRL (GAUZE/BANDAGES/DRESSINGS) IMPLANT
GLOVE BIOGEL PI IND STRL 9 (GLOVE) ×1 IMPLANT
GLOVE BIOGEL PI INDICATOR 9 (GLOVE) ×2
GLOVE SURG ORTHO 9.0 STRL STRW (GLOVE) ×3 IMPLANT
GOWN STRL REUS W/ TWL XL LVL3 (GOWN DISPOSABLE) ×2 IMPLANT
GOWN STRL REUS W/TWL XL LVL3 (GOWN DISPOSABLE) ×6
GRAFT DERMACARRIERS 1 TO 1.5 (DISPOSABLE) IMPLANT
GRAFT TISS THERASKIN 3X6 (Tissue) IMPLANT
KIT BASIN OR (CUSTOM PROCEDURE TRAY) ×3 IMPLANT
KIT ROOM TURNOVER OR (KITS) ×3 IMPLANT
MANIFOLD NEPTUNE II (INSTRUMENTS) ×3 IMPLANT
NDL HYPO 25GX1X1/2 BEV (NEEDLE) IMPLANT
NEEDLE HYPO 25GX1X1/2 BEV (NEEDLE) IMPLANT
NS IRRIG 1000ML POUR BTL (IV SOLUTION) ×5 IMPLANT
PACK ORTHO EXTREMITY (CUSTOM PROCEDURE TRAY) ×3 IMPLANT
PAD ARMBOARD 7.5X6 YLW CONV (MISCELLANEOUS) ×4 IMPLANT
PAD CAST 4YDX4 CTTN HI CHSV (CAST SUPPLIES) IMPLANT
PADDING CAST COTTON 4X4 STRL (CAST SUPPLIES)
STAPLER VISISTAT 35W (STAPLE) ×6 IMPLANT
SUCTION FRAZIER HANDLE 10FR (MISCELLANEOUS)
SUCTION TUBE FRAZIER 10FR DISP (MISCELLANEOUS) IMPLANT
SUT ETHILON 4 0 PS 2 18 (SUTURE) IMPLANT
SYR CONTROL 10ML LL (SYRINGE) IMPLANT
TISSUE THERASKIN 3X6 (Tissue) ×3 IMPLANT
TOWEL OR 17X24 6PK STRL BLUE (TOWEL DISPOSABLE) ×3 IMPLANT
TOWEL OR 17X26 10 PK STRL BLUE (TOWEL DISPOSABLE) ×3 IMPLANT
TUBE CONNECTING 12'X1/4 (SUCTIONS)
TUBE CONNECTING 12X1/4 (SUCTIONS) IMPLANT
WATER STERILE IRR 1000ML POUR (IV SOLUTION) ×1 IMPLANT

## 2016-03-20 NOTE — Anesthesia Procedure Notes (Signed)
Procedure Name: LMA Insertion Date/Time: 03/20/2016 11:04 AM Performed by: Myna Bright Pre-anesthesia Checklist: Patient identified, Emergency Drugs available, Suction available and Patient being monitored Patient Re-evaluated:Patient Re-evaluated prior to inductionOxygen Delivery Method: Circle system utilized Preoxygenation: Pre-oxygenation with 100% oxygen Intubation Type: IV induction LMA: LMA inserted LMA Size: 4.0 Tube type: Oral Number of attempts: 1 Placement Confirmation: positive ETCO2 and breath sounds checked- equal and bilateral Tube secured with: Tape Dental Injury: Teeth and Oropharynx as per pre-operative assessment

## 2016-03-20 NOTE — Anesthesia Postprocedure Evaluation (Signed)
Anesthesia Post Note  Patient: Joel Macdonald  Procedure(s) Performed: Procedure(s) (LRB): SKIN GRAFT SPLIT THICKNESS RIGHT LEG, APPLY THERASKIN AND WOUND VAC (Right)  Patient location during evaluation: PACU Anesthesia Type: General Level of consciousness: awake and alert Pain management: pain level controlled Vital Signs Assessment: post-procedure vital signs reviewed and stable Respiratory status: spontaneous breathing, nonlabored ventilation, respiratory function stable and patient connected to nasal cannula oxygen Cardiovascular status: stable Postop Assessment: no signs of nausea or vomiting Anesthetic complications: no    Last Vitals:  Vitals:   03/20/16 1157 03/20/16 1229  BP: (!) 154/88 (!) 157/86  Pulse: 75 72  Resp: 12 16  Temp: 36.6 C 36.4 C    Last Pain:  Vitals:   03/20/16 1229  TempSrc: Oral  PainSc:                  Hailly Fess

## 2016-03-20 NOTE — Progress Notes (Signed)
PT Cancellation Note  Patient Details Name: Joel Macdonald MRN: [id number] DOB: 1930/05/29   Cancelled Treatment:    Reason Eval/Treat Not Completed: Patient at procedure or test/unavailable.    Cassell Clement, PT, CSCS Pager [phone] Office [phone]  03/20/2016, 11:20 AM

## 2016-03-20 NOTE — Op Note (Signed)
03/16/2016 - 03/20/2016  11:35 AM  PATIENT:  Joel Macdonald    PRE-OPERATIVE DIAGNOSIS:  Necrotic Wound Right Leg  POST-OPERATIVE DIAGNOSIS:  Same  PROCEDURE:  SKIN GRAFT SPLIT THICKNESS RIGHT LEG, APPLY THERASKIN AND WOUND VAC  SURGEON:  Newt Minion, MD  PHYSICIAN ASSISTANT:None ANESTHESIA:   General  PREOPERATIVE INDICATIONS:  QUINTRELL HAMLETT is a  82 y.o. male with a diagnosis of Necrotic Wound Right Leg who failed conservative measures and elected for surgical management.    The risks benefits and alternatives were discussed with the patient preoperatively including but not limited to the risks of infection, bleeding, nerve injury, cardiopulmonary complications, the need for revision surgery, among others, and the patient was willing to proceed.  OPERATIVE IMPLANTS: 3 x 6" TheraSkin skin graft  OPERATIVE FINDINGS: Good beefy granulation tissue no sign 1 is no drainage no odor  OPERATIVE PROCEDURE: Patient was brought to the operating room and underwent a general anesthetic. After adequate levels anesthesia were obtained patient's right lower extremity was prepped using DuraPrep and the open wound was prepped using Betadine and draped into a sterile field. A timeout was called. A 10 blade knife was used to further excisional debride the wound bed of skin soft tissue and muscle, the wound bed had 100% beefy granulation tissue. This was irrigated with pulsatile lavage. Hemostasis was obtained. TheraSkin was applied to the wound stapled in place a Adaptic dressing was applied followed by a medium wound VAC. This had a good suction fit Ioban used for wound covewrage. Patient was extubated taken the PACU in stable condition.

## 2016-03-20 NOTE — Discharge Summary (Signed)
Discharge Diagnoses:  Active Problems:   Venous ulcer of right lower extremity with varicose veins (HCC)   Abscess of right leg   Surgeries: Procedure(s): SKIN GRAFT SPLIT THICKNESS RIGHT LEG, APPLY THERASKIN AND WOUND VAC on 03/16/2016 - 03/20/2016    Consultants:  non-  Discharged Condition: Improved  Hospital Course: Joel Macdonald is an 82 y.o. male who was admitted 03/16/2016 with a chief complaint of necrotic wound venous insufficiency right leg failed conservative treatment., with a final diagnosis of Necrotic Wound Right Leg.  Patient was brought to the operating room on 03/16/2016 - 03/20/2016 and underwent Procedure(s): SKIN GRAFT SPLIT THICKNESS RIGHT LEG, APPLY THERASKIN AND WOUND VAC.  Patient's cultures were positive for Pseudomonas his vancomycin was stopped he was kept on IV Zosyn throughout his hospital stay and was discharged on Bactrim DS.  Patient was given perioperative antibiotics: Anti-infectives    Start     Dose/Rate Route Frequency Ordered Stop   03/20/16 0000  sulfamethoxazole-trimethoprim (BACTRIM DS) 800-160 MG tablet     1 tablet Oral 2 times daily 03/20/16 1135     03/16/16 1100  vancomycin (VANCOCIN) 1,500 mg in sodium chloride 0.9 % 500 mL IVPB  Status:  Discontinued     1,500 mg 250 mL/hr over 120 Minutes Intravenous Every 24 hours 03/16/16 1023 03/18/16 0834   03/16/16 1100  piperacillin-tazobactam (ZOSYN) IVPB 3.375 g     3.375 g 12.5 mL/hr over 240 Minutes Intravenous Every 8 hours 03/16/16 1028     03/16/16 1030  piperacillin-tazobactam (ZOSYN) IVPB 3.375 g  Status:  Discontinued     3.375 g 12.5 mL/hr over 240 Minutes Intravenous 3 times daily 03/16/16 0955 03/16/16 1028   03/16/16 0720  ceFAZolin (ANCEF) IVPB 2g/100 mL premix     2 g 200 mL/hr over 30 Minutes Intravenous On call to O.R. 03/16/16 0720 03/16/16 0800    .  Patient was given sequential compression devices, early ambulation, and aspirin for DVT prophylaxis.  Recent vital signs:  Patient Vitals for the past 24 hrs:  BP Temp Temp src Pulse Resp SpO2  03/20/16 1229 (!) 157/86 97.5 F (36.4 C) Oral 72 16 100 %  03/20/16 1157 (!) 154/88 97.8 F (36.6 C) - 75 12 100 %  03/20/16 1150 (!) 152/86 - - 76 (!) 9 100 %  03/20/16 1135 (!) 145/88 97.2 F (36.2 C) - 83 10 98 %  03/20/16 0400 (!) 165/72 97.9 F (36.6 C) Oral 61 16 98 %  03/19/16 2004 (!) 178/89 98.4 F (36.9 C) Oral 76 16 99 %  .  Recent laboratory studies: No results found.  Discharge Medications:     Medication List    STOP taking these medications   doxycycline 100 MG tablet Commonly known as:  VIBRA-TABS   silver sulfADIAZINE 1 % cream Commonly known as:  SILVADENE   triamcinolone cream 0.1 % Commonly known as:  KENALOG     TAKE these medications   acetaminophen 500 MG tablet Commonly known as:  TYLENOL Take 1,000 mg by mouth every 6 (six) hours as needed for moderate pain or headache.   ALKA-SELTZER EXTRA STRENGTH 500 MG Tbef Generic drug:  Aspirin Effervescent Take 1,000 mg by mouth every 4 (four) hours as needed (pain/ indigestion/ heartburn).   aspirin 325 MG tablet Take 650 mg by mouth daily as needed for moderate pain.   fluticasone 50 MCG/ACT nasal spray Commonly known as:  FLONASE Place 1 spray into both nostrils daily as needed (seasonal  allergies).   ibuprofen 200 MG tablet Commonly known as:  ADVIL,MOTRIN Take 400 mg by mouth every 6 (six) hours as needed for mild pain (pain).   multivitamin with minerals Tabs tablet Take 1 tablet by mouth daily.   oxyCODONE-acetaminophen 5-325 MG tablet Commonly known as:  PERCOCET/ROXICET Take 1 tablet by mouth every 4 (four) hours as needed for severe pain.   ranitidine 150 MG tablet Commonly known as:  ZANTAC Take 150 mg by mouth daily as needed for heartburn.   sulfamethoxazole-trimethoprim 800-160 MG tablet Commonly known as:  BACTRIM DS Take 1 tablet by mouth 2 (two) times daily.   SYNTHROID 50 MCG tablet Generic drug:   levothyroxine Take 51mcg every morning on an empty stomach       Diagnostic Studies: No results found.  Patient benefited maximally from their hospital stay and there were no complications.     Disposition: 01-Home or Self Care Discharge Instructions    Call MD / Call 911    Complete by:  As directed    If you experience chest pain or shortness of breath, CALL 911 and be transported to the hospital emergency room.  If you develope a fever above 101 F, pus (white drainage) or increased drainage or redness at the wound, or calf pain, call your surgeon's office.   Constipation Prevention    Complete by:  As directed    Drink plenty of fluids.  Prune juice may be helpful.  You may use a stool softener, such as Colace (over the counter) 100 mg twice a day.  Use MiraLax (over the counter) for constipation as needed.   Diet - low sodium heart healthy    Complete by:  As directed    Elevate operative extremity    Complete by:  As directed    Increase activity slowly as tolerated    Complete by:  As directed    Negative Pressure Wound Therapy - Incisional    Complete by:  As directed    Leave the VAC on for 1 week follow-up in the office next week to change the VAC   Weight bearing as tolerated    Complete by:  As directed    Laterality:  bilateral     Follow-up Information    Newt Minion, MD In 1 week.   Specialty:  Orthopedic Surgery Contact information: [place] [address] [phone]            Signed: Newt Minion 03/20/2016, 1:38 PM

## 2016-03-20 NOTE — Anesthesia Preprocedure Evaluation (Signed)
Anesthesia Evaluation  Patient identified by MRN, date of birth, ID band Patient awake    Reviewed: Allergy & Precautions, NPO status , Patient's Chart, lab work & pertinent test results  Airway Mallampati: II  TM Distance: >3 FB Neck ROM: Full    Dental  (+) Dental Advisory Given, Teeth Intact   Pulmonary shortness of breath, former smoker,    breath sounds clear to auscultation       Cardiovascular hypertension, + Peripheral Vascular Disease  + Valvular Problems/Murmurs  Rhythm:Regular Rate:Normal     Neuro/Psych  Neuromuscular disease    GI/Hepatic GERD  Controlled,  Endo/Other  Hypothyroidism   Renal/GU      Musculoskeletal  (+) Arthritis ,   Abdominal   Peds  Hematology  (+) Blood dyscrasia, anemia ,   Anesthesia Other Findings   Reproductive/Obstetrics                             Anesthesia Physical Anesthesia Plan  ASA: III  Anesthesia Plan: General   Post-op Pain Management:    Induction: Intravenous  Airway Management Planned: LMA  Additional Equipment: None  Intra-op Plan:   Post-operative Plan: Extubation in OR  Informed Consent: I have reviewed the patients History and Physical, chart, labs and discussed the procedure including the risks, benefits and alternatives for the proposed anesthesia with the patient or authorized representative who has indicated his/her understanding and acceptance.   Dental advisory given  Plan Discussed with: CRNA and Surgeon  Anesthesia Plan Comments:         Anesthesia Quick Evaluation

## 2016-03-20 NOTE — Transfer of Care (Signed)
Immediate Anesthesia Transfer of Care Note  Patient: Joel Macdonald  Procedure(s) Performed: Procedure(s): SKIN GRAFT SPLIT THICKNESS RIGHT LEG, APPLY THERASKIN AND WOUND VAC (Right)  Patient Location: PACU  Anesthesia Type:General  Level of Consciousness: awake, alert , oriented and patient cooperative  Airway & Oxygen Therapy: Patient Spontanous Breathing and Patient connected to nasal cannula oxygen  Post-op Assessment: Report given to RN and Post -op Vital signs reviewed and stable  Post vital signs: Reviewed and stable  Last Vitals:  Vitals:   03/20/16 0400 03/20/16 1135  BP: (!) 165/72 (!) 145/88  Pulse: 61 83  Resp: 16 10  Temp: 36.6 C 36.2 C    Last Pain:  Vitals:   03/20/16 1135  TempSrc:   PainSc: (P) Asleep      Patients Stated Pain Goal: 3 ([id number])  Complications: No apparent anesthesia complications

## 2016-03-20 NOTE — Interval H&P Note (Signed)
History and Physical Interval Note:  03/20/2016 6:39 AM  Joel Macdonald  has presented today for surgery, with the diagnosis of Necrotic Wound Right Leg  The various methods of treatment have been discussed with the patient and family. After consideration of risks, benefits and other options for treatment, the patient has consented to  Procedure(s): SKIN GRAFT SPLIT THICKNESS RIGHT LEG, APPLY THERASKIN AND WOUND VAC (Right) as a surgical intervention .  The patient's history has been reviewed, patient examined, no change in status, stable for surgery.  I have reviewed the patient's chart and labs.  Questions were answered to the patient's satisfaction.     Newt Minion

## 2016-03-20 NOTE — Care Management Important Message (Signed)
Important Message  Patient Details  Name: Joel Macdonald MRN: [id number] Date of Birth: Aug 20, 1930   Medicare Important Message Given:  Yes    Orbie Pyo 03/20/2016, 10:48 AM

## 2016-03-20 NOTE — H&P (View-Only) (Signed)
Patient ID: Joel Macdonald, male   DOB: [birth year], 82 y.o.   MRN: [id number] Patient's cultures were positive for Pseudomonas. Currently on Zosyn, and vancomycin has been discontinued. Wound ostomy and continence nursing  has replace the infusion wound VAC and this is functioning well. Plan for return to the operating room tomorrow with placement of split-thickness skin graft and placement of a new wound VAC and discharged to home after surgery. Plan for discharge on Cipro.

## 2016-03-21 ENCOUNTER — Encounter (INDEPENDENT_AMBULATORY_CARE_PROVIDER_SITE_OTHER): Payer: Self-pay | Admitting: Orthopedic Surgery

## 2016-03-21 ENCOUNTER — Ambulatory Visit (INDEPENDENT_AMBULATORY_CARE_PROVIDER_SITE_OTHER): Payer: Medicare Other | Admitting: Orthopedic Surgery

## 2016-03-21 DIAGNOSIS — I87331 Chronic venous hypertension (idiopathic) with ulcer and inflammation of right lower extremity: Secondary | ICD-10-CM

## 2016-03-21 DIAGNOSIS — L97919 Non-pressure chronic ulcer of unspecified part of right lower leg with unspecified severity: Principal | ICD-10-CM

## 2016-03-21 LAB — TYPE AND SCREEN
ABO/RH(D): O POS
Antibody Identification: REACTIVE
Antibody Screen: POSITIVE
DAT, IgG: NEGATIVE
DONOR AG TYPE: NEGATIVE
DONOR AG TYPE: NEGATIVE
Donor AG Type: NEGATIVE
UNIT DIVISION: 0
Unit division: 0
Unit division: 0
Unit division: 0
Unit division: 0

## 2016-03-21 LAB — AEROBIC/ANAEROBIC CULTURE W GRAM STAIN (SURGICAL/DEEP WOUND)

## 2016-03-21 LAB — AEROBIC/ANAEROBIC CULTURE (SURGICAL/DEEP WOUND)

## 2016-03-21 NOTE — Progress Notes (Signed)
Office Visit Note   Patient: Joel Macdonald           Date of Birth: 07-04-1930           MRN: [id number] Visit Date: 03/21/2016              Requested by: Joel Manes, MD [address] [place], [place] [address] PCP: Joel Argyle, MD   Assessment & Plan: Visit Diagnoses: No diagnosis found.  Plan: Wound VAC reapplied liter follow-up in 1 week.  Follow-Up Instructions: No Follow-up on file.   Orders:  No orders of the defined types were placed in this encounter.  No orders of the defined types were placed in this encounter.     Procedures: No procedures performed   Clinical Data: No additional findings.   Subjective: Chief Complaint  Patient presents with  . Right Leg - Dressing Change    SKIN GRAFT SPLIT THICKNESS RIGHT LEG, APPLY THERASKIN AND WOUND VAC 03/20/16 basis    [name] He's wound vac is malfunctioning, there is no suction coming from vac pump. Joel Macdonald has been contacted for new items to apply a new dressing.   [name]  Review of Systems   Objective: Vital Signs: There were no vitals taken for this visit.  Physical Exam wound bed stable complicating features  Ortho Exam  Specialty Comments:  No specialty comments available.  Imaging: No results found.   PMFS History: Patient Active Problem List   Diagnosis Date Noted  . Venous ulcer of right lower extremity with varicose veins ([place]) 03/16/2016  . Abscess of right leg   . Status post skin graft 03/11/2016  . Idiopathic chronic venous hypertension of right lower extremity with ulcer and inflammation ([place]) 03/04/2016  . Hypothyroidism 01/09/2016  . Enterococcus faecalis infection   . Abscess   . Abscess of skin 11/17/2014  . Senile purpura ([place]) 10/18/2014  . MDS (myelodysplastic  syndrome), high grade ([place]) 06/13/2014  . Edema, peripheral 03/21/2014  . Monocytosis 03/21/2014  . Venous insufficiency (chronic) (peripheral) 08/03/2013  . Vasculitis ([place]) 08/02/2013  . Cellulitis of right lower limb 07/12/2013  . Ischemia of extremity 07/12/2013  . Cellulitis of leg, left 07/12/2013  . Cellulitis and abscess of leg 07/12/2013  . Compression fracture of T12 vertebra ([place]) 10/08/2011  . MDS (myelodysplastic syndrome), low grade ([place]) 09/20/2011  . Pancytopenia, acquired ([place]) 06/17/2011  . MGUS (monoclonal gammopathy of unknown significance) 06/17/2011  . HIP PAIN, BILATERAL 11/07/2009  . COMPRESSION FRACTURE, THORACIC VERTEBRA 11/07/2009  . ADENOCARCINOMA, PROSTATE, GLEASON GRADE 7 10/17/2009  . BACK PAIN, LUMBAR 10/17/2009  . Sciatica 10/17/2009  . SKIN CANCER 06/05/2006  . HYPERTENSION, BENIGN SYSTEMIC 06/05/2006  . RHINITIS, ALLERGIC 06/05/2006  . REFLUX ESOPHAGITIS 06/05/2006   Past Medical History:  Diagnosis Date  . Abscess of skin 11/17/2014   Left ankle: incise & drain 11/17/14  . Cellulitis and abscess of leg 05/12/2015   rt leg  . Cellulitis of leg, right 07/12/2013  . Dyspnea   . Edema, peripheral 03/21/2014  . GERD (gastroesophageal reflux disease)   . Heart murmur 1936  . History of blood transfusion    last episode- 04/2015  . Hypothyroidism   . Inguinal hernia    reducible - on R side   . Ischemia of extremity 07/12/2013  Bilateral toes  2,3,4 right; 1,3 left  Vascular doppler normal for large vessel occlusion 2/15  . Lymphedema    R leg- in the past   . MDS (myelodysplastic syndrome), high grade ([place]) 06/13/2014   7% blasts BM bx 05/05/14  . MDS (myelodysplastic syndrome), low grade ([place]) 09/20/2011   Bone marrow bx 06/13/11: mild dyserythro/dymegakaryopoiesis.  Normal cytogenetics.  FISH negative for chrom 5 or 7 deletions.  Scattered small infiltrates of plasmacytoid lymphs  . MGUS (monoclonal gammopathy of unknown significance) 06/17/2011  .  Monocytosis 03/21/2014  . Osteoarthritis    "back, legs, arms, shoulders" (07/12/2013)  . Pancytopenia, acquired ([place]) 06/17/2011   "from my Myelodysplastic syndrome"  . Prostate cancer ([place])   . Senile purpura ([facility]) 10/18/2014    Family History  Problem Relation Age of Onset  . Other Mother   . Other Father   . Heart disease Father   . Hyperlipidemia Son     Past Surgical History:  Procedure Laterality Date  . APPLICATION OF WOUND VAC Right 05/17/2015   Procedure: VAC placement;  Surgeon: Newt Minion, MD;  Location: [facility];  Service: Orthopedics;  Laterality: Right;  . CATARACT EXTRACTION W/ INTRAOCULAR LENS  IMPLANT, BILATERAL Bilateral ~ 2012  . I&D EXTREMITY Right 05/13/2015   Procedure: IRRIGATION AND DEBRIDEMENT ANKLE;  Surgeon: Newt Minion, MD;  Location: [facility];  Service: Orthopedics;  Laterality: Right;  . I&D EXTREMITY Right 05/17/2015   Procedure: IRRIGATION AND DEBRIDEMENT RIGHT LEG, APPLY SKIN GRAFT ;  Surgeon: Newt Minion, MD;  Location: [facility];  Service: Orthopedics;  Laterality: Right;  . I&D EXTREMITY Right 03/16/2016   Procedure: Excise Wound Right Leg, Apply Eunice Blase;  Surgeon: Newt Minion, MD;  Location: [facility];  Service: Orthopedics;  Laterality: Right;  . ROBOT ASSISTED LAPAROSCOPIC RADICAL PROSTATECTOMY  ~ 2010  . SKIN SPLIT GRAFT Right 12/06/2015   Procedure: APPLY SKIN GRAFT SPLIT THICKNESS TO RIGHT LEG;  Surgeon: Newt Minion, MD;  Location: [facility];  Service: Orthopedics;  Laterality: Right;  . TONSILLECTOMY AND ADENOIDECTOMY  ~ 1939   Social History   Occupational History  . Not on file.   Social History Main Topics  . Smoking status: Former Smoker    Years: 40.00    Types: Pipe    Quit date: 07/12/1993  . Smokeless tobacco: Never Used  . Alcohol use 4.2 oz/week    7 Shots of liquor per week     Comment: occasionally.- q evening- martini   . Drug use: No  . Sexual activity: Not on file

## 2016-03-22 ENCOUNTER — Telehealth (INDEPENDENT_AMBULATORY_CARE_PROVIDER_SITE_OTHER): Payer: Self-pay | Admitting: Orthopedic Surgery

## 2016-03-22 NOTE — Telephone Encounter (Signed)
Syretta with [employer] is asking what home health pt is using and their phone number. Her number is [phone] ext [phone]

## 2016-03-25 ENCOUNTER — Encounter (HOSPITAL_COMMUNITY)
Admission: RE | Admit: 2016-03-25 | Discharge: 2016-03-25 | Disposition: A | Discharge status: Home / Self Care | Payer: Medicare Other | Source: Ambulatory Visit | Attending: Oncology | Admitting: Oncology

## 2016-03-25 DIAGNOSIS — D61818 Other pancytopenia: Secondary | ICD-10-CM

## 2016-03-25 DIAGNOSIS — D72821 Monocytosis (symptomatic): Secondary | ICD-10-CM | POA: Insufficient documentation

## 2016-03-25 DIAGNOSIS — D46Z Other myelodysplastic syndromes: Secondary | ICD-10-CM | POA: Diagnosis present

## 2016-03-25 DIAGNOSIS — D4620 Refractory anemia with excess of blasts, unspecified: Secondary | ICD-10-CM

## 2016-03-25 DIAGNOSIS — D619 Aplastic anemia, unspecified: Secondary | ICD-10-CM | POA: Diagnosis present

## 2016-03-25 LAB — POCT HEMOGLOBIN-HEMACUE: Hemoglobin: 8.7 g/dL — ABNORMAL LOW (ref 13.0–17.0)

## 2016-03-25 MED ORDER — DARBEPOETIN ALFA 300 MCG/0.6ML IJ SOSY
300.0000 ug | PREFILLED_SYRINGE | Freq: Once | INTRAMUSCULAR | Status: AC
  Administered 2016-03-25: 300 ug via SUBCUTANEOUS
  Filled 2016-03-25: qty 0.6

## 2016-03-25 MED ORDER — CLONIDINE HCL 0.1 MG PO TABS: 0.1000 mg | ORAL_TABLET | Freq: Once | ORAL | Status: DC | PRN

## 2016-03-25 MED ORDER — DARBEPOETIN ALFA 150 MCG/0.3ML IJ SOSY
PREFILLED_SYRINGE | INTRAMUSCULAR | Status: AC
  Filled 2016-03-25: qty 0.6

## 2016-03-25 NOTE — Telephone Encounter (Signed)
Patient has appt tomorrow will ask and discuss at follow up.

## 2016-03-26 ENCOUNTER — Ambulatory Visit (INDEPENDENT_AMBULATORY_CARE_PROVIDER_SITE_OTHER): Payer: Medicare Other | Admitting: Orthopedic Surgery

## 2016-03-26 ENCOUNTER — Encounter (INDEPENDENT_AMBULATORY_CARE_PROVIDER_SITE_OTHER): Payer: Self-pay | Admitting: Orthopedic Surgery

## 2016-03-26 DIAGNOSIS — L97919 Non-pressure chronic ulcer of unspecified part of right lower leg with unspecified severity: Principal | ICD-10-CM

## 2016-03-26 DIAGNOSIS — M79661 Pain in right lower leg: Secondary | ICD-10-CM

## 2016-03-26 DIAGNOSIS — I87331 Chronic venous hypertension (idiopathic) with ulcer and inflammation of right lower extremity: Secondary | ICD-10-CM

## 2016-03-26 NOTE — Progress Notes (Signed)
Office Visit Note   Patient: Joel Macdonald           Date of Birth: 1930/10/02           MRN: [id number] Visit Date: 03/26/2016              Requested by: Lajean Manes, MD [address] [place], [place] [address] PCP: Mathews Argyle, MD   Assessment & Plan: Visit Diagnoses:  1. Idiopathic chronic venous hypertension of right lower extremity with ulcer and inflammation (HCC)     Plan: Patient had turned the wound VAC off last night. The wound VAC was changed this had a good suction fit. Plan to follow-up on Friday morning with [place] to remove the [facility] dressing apply Silvadene gauze and an Ace wrap.  Follow-Up Instructions: Return in about 3 days (around 03/29/2016).   Orders:  No orders of the defined types were placed in this encounter.  No orders of the defined types were placed in this encounter.     Procedures: No procedures performed   Clinical Data: No additional findings.   Subjective: Chief Complaint  Patient presents with  . Right Leg - Routine Post Op    SKIN GRAFT SPLIT THICKNESS RIGHT LEG, APPLY THERASKIN AND WOUND VAC 03/20/16    Patient is here for follow up skin graft split thickness right leg, apply theraskin and wound vac 03/20/16. He believed to have a leak with wound vac which he believed to have fixed with additional opposite. The vac was continuously alarming throughout the evening and turned off and turned on this morning. He has also stopped Bactrim DS due to stomach discomfort. Wound vac is removed in office. There is healthy granulation tissue with staples intact.    Review of Systems   Objective: Vital Signs: There were no vitals taken for this visit.  Physical Exam examination the skin graft has excellent take there is good granulation tissue no cellulitis no drainage no signs of infection. Of note patient stopped his Bactrim DS secondary to GI symptoms. Patient will hold on antibiotics at this time.  Ortho  Exam  Specialty Comments:  No specialty comments available.  Imaging: No results found.   PMFS History: Patient Active Problem List   Diagnosis Date Noted  . Venous ulcer of right lower extremity with varicose veins ([place]) 03/16/2016  . Abscess of right leg   . Status post skin graft 03/11/2016  . Idiopathic chronic venous hypertension of right lower extremity with ulcer and inflammation ([place]) 03/04/2016  . Hypothyroidism 01/09/2016  . Enterococcus faecalis infection   . Abscess   . Abscess of skin 11/17/2014  . Senile purpura ([place]) 10/18/2014  . MDS (myelodysplastic syndrome), high grade ([place]) 06/13/2014  . Edema, peripheral 03/21/2014  . Monocytosis 03/21/2014  . Venous insufficiency (chronic) (peripheral) 08/03/2013  . Vasculitis ([place]) 08/02/2013  . Cellulitis of right lower limb 07/12/2013  . Ischemia of extremity 07/12/2013  . Cellulitis of leg, left 07/12/2013  . Cellulitis and abscess of leg 07/12/2013  . Compression fracture of T12 vertebra ([place]) 10/08/2011  . MDS (myelodysplastic syndrome), low grade ([place]) 09/20/2011  . Pancytopenia, acquired ([place]) 06/17/2011  . MGUS (monoclonal gammopathy of unknown significance) 06/17/2011  . HIP PAIN, BILATERAL 11/07/2009  . COMPRESSION FRACTURE, THORACIC VERTEBRA 11/07/2009  . ADENOCARCINOMA, PROSTATE, GLEASON GRADE 7 10/17/2009  . BACK PAIN, LUMBAR 10/17/2009  . Sciatica 10/17/2009  . SKIN CANCER 06/05/2006  . HYPERTENSION, BENIGN SYSTEMIC 06/05/2006  . RHINITIS, ALLERGIC 06/05/2006  .  REFLUX ESOPHAGITIS 06/05/2006   Past Medical History:  Diagnosis Date  . Abscess of skin 11/17/2014   Left ankle: incise & drain 11/17/14  . Cellulitis and abscess of leg 05/12/2015   rt leg  . Cellulitis of leg, right 07/12/2013  . Dyspnea   . Edema, peripheral 03/21/2014  . GERD (gastroesophageal reflux disease)   . Heart murmur 1936  . History of blood transfusion    last episode- 04/2015  . Hypothyroidism   . Inguinal hernia     reducible - on R side   . Ischemia of extremity 07/12/2013   Bilateral toes  2,3,4 right; 1,3 left  Vascular doppler normal for large vessel occlusion 2/15  . Lymphedema    R leg- in the past   . MDS (myelodysplastic syndrome), high grade ([place]) 06/13/2014   7% blasts BM bx 05/05/14  . MDS (myelodysplastic syndrome), low grade ([place]) 09/20/2011   Bone marrow bx 06/13/11: mild dyserythro/dymegakaryopoiesis.  Normal cytogenetics.  FISH negative for chrom 5 or 7 deletions.  Scattered small infiltrates of plasmacytoid lymphs  . MGUS (monoclonal gammopathy of unknown significance) 06/17/2011  . Monocytosis 03/21/2014  . Osteoarthritis    "back, legs, arms, shoulders" (07/12/2013)  . Pancytopenia, acquired ([place]) 06/17/2011   "from my Myelodysplastic syndrome"  . Prostate cancer ([place])   . Senile purpura ([place]) 10/18/2014    Family History  Problem Relation Age of Onset  . Other Mother   . Other Father   . Heart disease Father   . Hyperlipidemia Son     Past Surgical History:  Procedure Laterality Date  . APPLICATION OF WOUND VAC Right 05/17/2015   Procedure: VAC placement;  Surgeon: Newt Minion, MD;  Location: [place];  Service: Orthopedics;  Laterality: Right;  . CATARACT EXTRACTION W/ INTRAOCULAR LENS  IMPLANT, BILATERAL Bilateral ~ 2012  . I&D EXTREMITY Right 05/13/2015   Procedure: IRRIGATION AND DEBRIDEMENT ANKLE;  Surgeon: Newt Minion, MD;  Location: [place];  Service: Orthopedics;  Laterality: Right;  . I&D EXTREMITY Right 05/17/2015   Procedure: IRRIGATION AND DEBRIDEMENT RIGHT LEG, APPLY SKIN GRAFT ;  Surgeon: Newt Minion, MD;  Location: [place];  Service: Orthopedics;  Laterality: Right;  . I&D EXTREMITY Right 03/16/2016   Procedure: Excise Wound Right Leg, Apply Eunice Blase;  Surgeon: Newt Minion, MD;  Location: [place];  Service: Orthopedics;  Laterality: Right;  . ROBOT ASSISTED LAPAROSCOPIC RADICAL PROSTATECTOMY  ~ 2010  . SKIN SPLIT GRAFT Right 12/06/2015   Procedure: APPLY SKIN GRAFT SPLIT  THICKNESS TO RIGHT LEG;  Surgeon: Newt Minion, MD;  Location: [place];  Service: Orthopedics;  Laterality: Right;  . SKIN SPLIT GRAFT Right 03/20/2016   Procedure: SKIN GRAFT SPLIT THICKNESS RIGHT LEG, APPLY THERASKIN AND WOUND VAC;  Surgeon: Newt Minion, MD;  Location: [place];  Service: Orthopedics;  Laterality: Right;  . TONSILLECTOMY AND ADENOIDECTOMY  ~ 1939   Social History   Occupational History  . Not on file.   Social History Main Topics  . Smoking status: Former Smoker    Years: 40.00    Types: Pipe    Quit date: 07/12/1993  . Smokeless tobacco: Never Used  . Alcohol use 4.2 oz/week    7 Shots of liquor per week     Comment: occasionally.- q evening- martini   . Drug use: No  . Sexual activity: Not on file

## 2016-03-26 NOTE — Telephone Encounter (Signed)
Called to confirm that patient does not see home health aid, vac is being changed in office.

## 2016-03-29 ENCOUNTER — Encounter (INDEPENDENT_AMBULATORY_CARE_PROVIDER_SITE_OTHER): Payer: Self-pay

## 2016-03-29 ENCOUNTER — Ambulatory Visit (INDEPENDENT_AMBULATORY_CARE_PROVIDER_SITE_OTHER): Payer: Medicare Other | Admitting: Radiology

## 2016-03-29 DIAGNOSIS — Z945 Skin transplant status: Secondary | ICD-10-CM

## 2016-03-29 NOTE — Progress Notes (Signed)
Patient in office today for nurse only visit for right leg. He is status post split thickness skin grafting on 03/20/16. He had a wound vac reapplied in office this week and is to be removed today per Dr. Sharol Given. Photographs were taken status post vac removal and sent to Dr. Sharol Given. There is healthy tissue. There is no odor. Staples were removed today per Dr. Sharol Given. And silvadene dressing was applied. Patient was advised that he will need to be doing this daily. Advised him to clean wound daily, lightly with 4x4 and saline, or with water and dial soap solution. There is a minor abrasion anteriorly where vac hose was held with coban, this the patient had done himself after leaving the office. Advised patient to return next week for repeat evaluation.

## 2016-04-05 ENCOUNTER — Ambulatory Visit (INDEPENDENT_AMBULATORY_CARE_PROVIDER_SITE_OTHER): Payer: Medicare Other | Admitting: Orthopedic Surgery

## 2016-04-05 ENCOUNTER — Encounter (INDEPENDENT_AMBULATORY_CARE_PROVIDER_SITE_OTHER): Payer: Self-pay | Admitting: Orthopedic Surgery

## 2016-04-05 VITALS — Ht 73.0 in | Wt 191.0 lb

## 2016-04-05 DIAGNOSIS — L97919 Non-pressure chronic ulcer of unspecified part of right lower leg with unspecified severity: Principal | ICD-10-CM

## 2016-04-05 DIAGNOSIS — I87331 Chronic venous hypertension (idiopathic) with ulcer and inflammation of right lower extremity: Secondary | ICD-10-CM

## 2016-04-05 NOTE — Progress Notes (Signed)
Office Visit Note   Patient: Joel Macdonald           Date of Birth: Sep 10, 1930           MRN: [id number] Visit Date: 04/05/2016              Requested by: Lajean Manes, MD [address] [place], [place] [address] PCP: Mathews Argyle, MD   Assessment & Plan: Visit Diagnoses:  1. Idiopathic chronic venous hypertension of right lower extremity with ulcer and inflammation (HCC)     Plan: Patient has increased clear drainage and increased swelling in the right lower extremity. Discussed the importance of elevation. Due to the increased swelling we will apply silver alginate dressing and a Dynaflex wraps. Follow-up on Tuesday.  Follow-Up Instructions: Return in about 1 week (around 04/12/2016).   Orders:  No orders of the defined types were placed in this encounter.  No orders of the defined types were placed in this encounter.     Procedures: No procedures performed   Clinical Data: No additional findings.   Subjective: Chief Complaint  Patient presents with  . Right Leg - Routine Post Op    Excise Wound Right Leg, removal skin soft tissue and muscle with wound vac    Patient is changing his dressing daily silvadene and ace wrap. He wraps to just above the ulcer and does not include the foot. The leg not covered by the ace bandage is swollen and the wound bed is a dark brown seaping a yellow clear fluid. The pt is not taking an ABX and does not have any questions or concerns.     Review of Systems   Objective: Vital Signs: Ht 6\' 1"  (1.854 m)   Wt 191 lb (86.6 kg)   BMI 25.20 kg/m   Physical Exam examination patient has significant increased of venous swelling in the right lower extremity there is clear drainage there is no cellulitis no pain no signs of infection. Patient does have maceration to the skin graft secondary to drainage.  Ortho Exam  Specialty Comments:  No specialty comments available.  Imaging: No results found.   PMFS  History: Patient Active Problem List   Diagnosis Date Noted  . Venous ulcer of right lower extremity with varicose veins ([place]) 03/16/2016  . Abscess of right leg   . Status post skin graft 03/11/2016  . Idiopathic chronic venous hypertension of right lower extremity with ulcer and inflammation ([place]) 03/04/2016  . Hypothyroidism 01/09/2016  . Enterococcus faecalis infection   . Abscess   . Abscess of skin 11/17/2014  . Senile purpura ([place]) 10/18/2014  . MDS (myelodysplastic syndrome), high grade ([place]) 06/13/2014  . Edema, peripheral 03/21/2014  . Monocytosis 03/21/2014  . Venous insufficiency (chronic) (peripheral) 08/03/2013  . Vasculitis ([place]) 08/02/2013  . Cellulitis of right lower limb 07/12/2013  . Ischemia of extremity 07/12/2013  . Cellulitis of leg, left 07/12/2013  . Cellulitis and abscess of leg 07/12/2013  . Compression fracture of T12 vertebra ([place]) 10/08/2011  . MDS (myelodysplastic syndrome), low grade ([place]) 09/20/2011  . Pancytopenia, acquired ([place]) 06/17/2011  . MGUS (monoclonal gammopathy of unknown significance) 06/17/2011  . HIP PAIN, BILATERAL 11/07/2009  . COMPRESSION FRACTURE, THORACIC VERTEBRA 11/07/2009  . ADENOCARCINOMA, PROSTATE, GLEASON GRADE 7 10/17/2009  . BACK PAIN, LUMBAR 10/17/2009  . Sciatica 10/17/2009  . SKIN CANCER 06/05/2006  . HYPERTENSION, BENIGN SYSTEMIC 06/05/2006  . RHINITIS, ALLERGIC 06/05/2006  . REFLUX ESOPHAGITIS 06/05/2006   Past Medical  History:  Diagnosis Date  . Abscess of skin 11/17/2014   Left ankle: incise & drain 11/17/14  . Cellulitis and abscess of leg 05/12/2015   rt leg  . Cellulitis of leg, right 07/12/2013  . Dyspnea   . Edema, peripheral 03/21/2014  . GERD (gastroesophageal reflux disease)   . Heart murmur 1936  . History of blood transfusion    last episode- 04/2015  . Hypothyroidism   . Inguinal hernia    reducible - on R side   . Ischemia of extremity 07/12/2013   Bilateral toes  2,3,4 right; 1,3 left   Vascular doppler normal for large vessel occlusion 2/15  . Lymphedema    R leg- in the past   . MDS (myelodysplastic syndrome), high grade ([place]) 06/13/2014   7% blasts BM bx 05/05/14  . MDS (myelodysplastic syndrome), low grade ([place]) 09/20/2011   Bone marrow bx 06/13/11: mild dyserythro/dymegakaryopoiesis.  Normal cytogenetics.  FISH negative for chrom 5 or 7 deletions.  Scattered small infiltrates of plasmacytoid lymphs  . MGUS (monoclonal gammopathy of unknown significance) 06/17/2011  . Monocytosis 03/21/2014  . Osteoarthritis    "back, legs, arms, shoulders" (07/12/2013)  . Pancytopenia, acquired ([place]) 06/17/2011   "from my Myelodysplastic syndrome"  . Prostate cancer ([place])   . Senile purpura ([place]) 10/18/2014    Family History  Problem Relation Age of Onset  . Other Mother   . Other Father   . Heart disease Father   . Hyperlipidemia Son     Past Surgical History:  Procedure Laterality Date  . APPLICATION OF WOUND VAC Right 05/17/2015   Procedure: VAC placement;  Surgeon: Newt Minion, MD;  Location: [place];  Service: Orthopedics;  Laterality: Right;  . CATARACT EXTRACTION W/ INTRAOCULAR LENS  IMPLANT, BILATERAL Bilateral ~ 2012  . I&D EXTREMITY Right 05/13/2015   Procedure: IRRIGATION AND DEBRIDEMENT ANKLE;  Surgeon: Newt Minion, MD;  Location: [place];  Service: Orthopedics;  Laterality: Right;  . I&D EXTREMITY Right 05/17/2015   Procedure: IRRIGATION AND DEBRIDEMENT RIGHT LEG, APPLY SKIN GRAFT ;  Surgeon: Newt Minion, MD;  Location: [place];  Service: Orthopedics;  Laterality: Right;  . I&D EXTREMITY Right 03/16/2016   Procedure: Excise Wound Right Leg, Apply Eunice Blase;  Surgeon: Newt Minion, MD;  Location: [place];  Service: Orthopedics;  Laterality: Right;  . ROBOT ASSISTED LAPAROSCOPIC RADICAL PROSTATECTOMY  ~ 2010  . SKIN SPLIT GRAFT Right 12/06/2015   Procedure: APPLY SKIN GRAFT SPLIT THICKNESS TO RIGHT LEG;  Surgeon: Newt Minion, MD;  Location: [place];  Service: Orthopedics;   Laterality: Right;  . SKIN SPLIT GRAFT Right 03/20/2016   Procedure: SKIN GRAFT SPLIT THICKNESS RIGHT LEG, APPLY THERASKIN AND WOUND VAC;  Surgeon: Newt Minion, MD;  Location: [place];  Service: Orthopedics;  Laterality: Right;  . TONSILLECTOMY AND ADENOIDECTOMY  ~ 1939   Social History   Occupational History  . Not on file.   Social History Main Topics  . Smoking status: Former Smoker    Years: 40.00    Types: Pipe    Quit date: 07/12/1993  . Smokeless tobacco: Never Used  . Alcohol use 4.2 oz/week    7 Shots of liquor per week     Comment: occasionally.- q evening- martini   . Drug use: No  . Sexual activity: Not on file

## 2016-04-09 ENCOUNTER — Ambulatory Visit (INDEPENDENT_AMBULATORY_CARE_PROVIDER_SITE_OTHER): Payer: Medicare Other | Admitting: Family

## 2016-04-09 ENCOUNTER — Encounter (INDEPENDENT_AMBULATORY_CARE_PROVIDER_SITE_OTHER): Payer: Self-pay | Admitting: Family

## 2016-04-09 VITALS — Ht 73.0 in | Wt 191.0 lb

## 2016-04-09 DIAGNOSIS — Z945 Skin transplant status: Secondary | ICD-10-CM | POA: Diagnosis not present

## 2016-04-09 NOTE — Progress Notes (Signed)
Office Visit Note   Patient: Joel Macdonald           Date of Birth: 26-Nov-1930           MRN: [id number] Visit Date: 04/09/2016              Requested by: Lajean Manes, MD [address] [address] [place], [place] [address] PCP: Mathews Argyle, MD   Assessment & Plan: Visit Diagnoses:  1. Status post skin graft     Plan: We'll rewrap with silver cell and Dynaflex as this worked well over the last 6 days. He will follow up on Friday for reevaluation.  Follow-Up Instructions: Return in about 3 days (around 04/12/2016).   Orders:  No orders of the defined types were placed in this encounter.  No orders of the defined types were placed in this encounter.     Procedures: No procedures performed   Clinical Data: No additional findings.   Subjective:  Chief Complaint  Patient presents with  . Right Leg - Routine Post Op    03/20/16 STSG right lower extremity     Pt is an 83 year old gentleman who is seen today for reevaluation of split thickness skin graft to the right lower extremity. is in a dynaflex compression wrap. The swelling has gone down quite a bit and there is a significant amount of yellow greenish drainage that has seemed through to the outer layers of the dressing. He is not on an ABX and is weight bearing in a regular shoe. He has no question or concerns today.     Review of Systems  Constitutional: Negative for chills and fever.     Objective: Vital Signs: Ht 6\' 1"  (1.854 m)   Wt 191 lb (86.6 kg)   BMI 25.20 kg/m   Physical Exam  Constitutional: He is oriented to person, place, and time. He appears well-developed and well-nourished.  Pulmonary/Chest: Effort normal.  Neurological: He is alert and oriented to person, place, and time.  Skin:  Split-thickness skin graft is as above, see attached image. There is great granulation tissue in the wound bed exudative tissue was debrided. Grade wrinkling of the skin. There is only trace edema to  the right lower extremity. There is no surrounding erythema and no maceration. no odor today  Psychiatric: He has a normal mood and affect.  Nursing note reviewed.   Ortho Exam  Specialty Comments:  No specialty comments available.  Imaging: No results found.   PMFS History: Patient Active Problem List   Diagnosis Date Noted  . Venous ulcer of right lower extremity with varicose veins ([place]) 03/16/2016  . Abscess of right leg   . Status post skin graft 03/11/2016  . Idiopathic chronic venous hypertension of right lower extremity with ulcer and inflammation ([place]) 03/04/2016  . Hypothyroidism 01/09/2016  . Enterococcus faecalis infection   . Abscess   . Abscess of skin 11/17/2014  . Senile purpura ([place]) 10/18/2014  . MDS (myelodysplastic syndrome), high grade ([place]) 06/13/2014  . Edema, peripheral 03/21/2014  . Monocytosis 03/21/2014  . Venous insufficiency (chronic) (peripheral) 08/03/2013  . Vasculitis ([place]) 08/02/2013  . Cellulitis of right lower limb 07/12/2013  . Ischemia of extremity 07/12/2013  . Cellulitis of leg, left 07/12/2013  . Cellulitis and abscess of leg 07/12/2013  . Compression fracture of T12 vertebra ([place]) 10/08/2011  . MDS (myelodysplastic syndrome), low grade ([place]) 09/20/2011  . Pancytopenia, acquired ([place]) 06/17/2011  . MGUS (monoclonal gammopathy of unknown significance) 06/17/2011  .  HIP PAIN, BILATERAL 11/07/2009  . COMPRESSION FRACTURE, THORACIC VERTEBRA 11/07/2009  . ADENOCARCINOMA, PROSTATE, GLEASON GRADE 7 10/17/2009  . BACK PAIN, LUMBAR 10/17/2009  . Sciatica 10/17/2009  . SKIN CANCER 06/05/2006  . HYPERTENSION, BENIGN SYSTEMIC 06/05/2006  . RHINITIS, ALLERGIC 06/05/2006  . REFLUX ESOPHAGITIS 06/05/2006   Past Medical History:  Diagnosis Date  . Abscess of skin 11/17/2014   Left ankle: incise & drain 11/17/14  . Cellulitis and abscess of leg 05/12/2015   rt leg  . Cellulitis of leg, right 07/12/2013  . Dyspnea   . Edema, peripheral  03/21/2014  . GERD (gastroesophageal reflux disease)   . Heart murmur 1936  . History of blood transfusion    last episode- 04/2015  . Hypothyroidism   . Inguinal hernia    reducible - on R side   . Ischemia of extremity 07/12/2013   Bilateral toes  2,3,4 right; 1,3 left  Vascular doppler normal for large vessel occlusion 2/15  . Lymphedema    R leg- in the past   . MDS (myelodysplastic syndrome), high grade ([place]) 06/13/2014   7% blasts BM bx 05/05/14  . MDS (myelodysplastic syndrome), low grade ([place]) 09/20/2011   Bone marrow bx 06/13/11: mild dyserythro/dymegakaryopoiesis.  Normal cytogenetics.  FISH negative for chrom 5 or 7 deletions.  Scattered small infiltrates of plasmacytoid lymphs  . MGUS (monoclonal gammopathy of unknown significance) 06/17/2011  . Monocytosis 03/21/2014  . Osteoarthritis    "back, legs, arms, shoulders" (07/12/2013)  . Pancytopenia, acquired ([place]) 06/17/2011   "from my Myelodysplastic syndrome"  . Prostate cancer ([place])   . Senile purpura ([place]) 10/18/2014    Family History  Problem Relation Age of Onset  . Other Mother   . Other Father   . Heart disease Father   . Hyperlipidemia Son     Past Surgical History:  Procedure Laterality Date  . APPLICATION OF WOUND VAC Right 05/17/2015   Procedure: VAC placement;  Surgeon: Newt Minion, MD;  Location: [facility];  Service: Orthopedics;  Laterality: Right;  . CATARACT EXTRACTION W/ INTRAOCULAR LENS  IMPLANT, BILATERAL Bilateral ~ 2012  . I&D EXTREMITY Right 05/13/2015   Procedure: IRRIGATION AND DEBRIDEMENT ANKLE;  Surgeon: Newt Minion, MD;  Location: [facility];  Service: Orthopedics;  Laterality: Right;  . I&D EXTREMITY Right 05/17/2015   Procedure: IRRIGATION AND DEBRIDEMENT RIGHT LEG, APPLY SKIN GRAFT ;  Surgeon: Newt Minion, MD;  Location: [facility];  Service: Orthopedics;  Laterality: Right;  . I&D EXTREMITY Right 03/16/2016   Procedure: Excise Wound Right Leg, Apply Eunice Blase;  Surgeon: Newt Minion, MD;  Location: [facility];   Service: Orthopedics;  Laterality: Right;  . ROBOT ASSISTED LAPAROSCOPIC RADICAL PROSTATECTOMY  ~ 2010  . SKIN SPLIT GRAFT Right 12/06/2015   Procedure: APPLY SKIN GRAFT SPLIT THICKNESS TO RIGHT LEG;  Surgeon: Newt Minion, MD;  Location: [facility];  Service: Orthopedics;  Laterality: Right;  . SKIN SPLIT GRAFT Right 03/20/2016   Procedure: SKIN GRAFT SPLIT THICKNESS RIGHT LEG, APPLY THERASKIN AND WOUND VAC;  Surgeon: Newt Minion, MD;  Location: [facility];  Service: Orthopedics;  Laterality: Right;  . TONSILLECTOMY AND ADENOIDECTOMY  ~ 1939   Social History   Occupational History  . Not on file.   Social History Main Topics  . Smoking status: Former Smoker    Years: 40.00    Types: Pipe    Quit date: 07/12/1993  . Smokeless tobacco: Never Used  . Alcohol use 4.2 oz/week  7 Shots of liquor per week     Comment: occasionally.- q evening- martini   . Drug use: No  . Sexual activity: Not on file

## 2016-04-12 ENCOUNTER — Ambulatory Visit (INDEPENDENT_AMBULATORY_CARE_PROVIDER_SITE_OTHER): Payer: Medicare Other | Admitting: Family

## 2016-04-12 DIAGNOSIS — Z945 Skin transplant status: Secondary | ICD-10-CM

## 2016-04-12 NOTE — Progress Notes (Signed)
Office Visit Note   Patient: Joel Macdonald           Date of Birth: [birth year]           MRN: [id number] Visit Date: 04/12/2016              Requested by: Lajean Manes, MD [address] [place], [place] [address] PCP: Mathews Argyle, MD  Chief Complaint  Patient presents with  . Right Leg - Routine Post Op    03/20/16 STSG and wound vac to right leg wound    HPI: Silvercell, ABD pad and dynaflex compression wrap applied on Tuesday. There is good wrinkling to the skin. The ulcer is still draining a large amount of green drainage through the dressing. There is no odor and no redness. The pt is not an ABX and is full weight bearing in a boot that does cause the dressing to roll up slightly on his foot. Pamella Pert, RMA  Patient is an 83 year old gentleman seen today in follow-up status post split-thickness skin grafting to the right lower extremity on December 13 last year. Has been treated measures with silver sock dressing followed by ABDs and a Dynaflex wrap for swelling. There is good wrinkling of the skin however he has drained through the dressing and the wrap again. Patient does state him he is able to elevated some however has been very busy due to the holidays and unable to keep this elevated much of the day.  Prefers not to wear a postop shoe. States that would be hard to get around in this. Wearing a [employer] snow boot which pushes and rolls the Dynaflex wrap.    Assessment & Plan:     Visit Diagnoses:  1. Status post skin graft     Plan: Dynaflex wrap today with silver alginate over the skin graft. Encouraged him to elevate. Encouraged him to wear his postop shoe. He will follow-up in office on Tuesday for prewrapping.  Follow-Up Instructions: Return in about 4 days (around 04/16/2016).   Ortho Exam Patient is alert and oriented. No adenopathy. Well-dressed. Normal affect. Respirations easy. Steady gait. Skin graft as of by the visiting images  that are attached to the nerve. Great granulation tissue this is bleeding. No foul odor today. There appears to be about 4 mm of healing circumferentially. No purulence or surrounding cellulitis.   Imaging: No results found.  Orders:  No orders of the defined types were placed in this encounter.  No orders of the defined types were placed in this encounter.    Procedures: No procedures performed  Clinical Data: No additional findings.  Subjective: Review of Systems  Objective: Vital Signs: There were no vitals taken for this visit.  Specialty Comments:  No specialty comments available.  PMFS History: Patient Active Problem List   Diagnosis Date Noted  . Venous ulcer of right lower extremity with varicose veins ([place]) 03/16/2016  . Abscess of right leg   . Status post skin graft 03/11/2016  . Idiopathic chronic venous hypertension of right lower extremity with ulcer and inflammation ([place]) 03/04/2016  . Hypothyroidism 01/09/2016  . Enterococcus faecalis infection   . Abscess   . Abscess of skin 11/17/2014  . Senile purpura ([place]) 10/18/2014  . MDS (myelodysplastic syndrome), high grade ([place]) 06/13/2014  . Edema, peripheral 03/21/2014  . Monocytosis 03/21/2014  . Venous insufficiency (chronic) (peripheral) 08/03/2013  . Vasculitis ([place]) 08/02/2013  . Cellulitis of right lower limb 07/12/2013  .  Ischemia of extremity 07/12/2013  . Cellulitis of leg, left 07/12/2013  . Cellulitis and abscess of leg 07/12/2013  . Compression fracture of T12 vertebra ([place]) 10/08/2011  . MDS (myelodysplastic syndrome), low grade ([place]) 09/20/2011  . Pancytopenia, acquired ([place]) 06/17/2011  . MGUS (monoclonal gammopathy of unknown significance) 06/17/2011  . HIP PAIN, BILATERAL 11/07/2009  . COMPRESSION FRACTURE, THORACIC VERTEBRA 11/07/2009  . ADENOCARCINOMA, PROSTATE, GLEASON GRADE 7 10/17/2009  . BACK PAIN, LUMBAR 10/17/2009  . Sciatica 10/17/2009  . SKIN CANCER 06/05/2006  .  HYPERTENSION, BENIGN SYSTEMIC 06/05/2006  . RHINITIS, ALLERGIC 06/05/2006  . REFLUX ESOPHAGITIS 06/05/2006   Past Medical History:  Diagnosis Date  . Abscess of skin 11/17/2014   Left ankle: incise & drain 11/17/14  . Cellulitis and abscess of leg 05/12/2015   rt leg  . Cellulitis of leg, right 07/12/2013  . Dyspnea   . Edema, peripheral 03/21/2014  . GERD (gastroesophageal reflux disease)   . Heart murmur 1936  . History of blood transfusion    last episode- 04/2015  . Hypothyroidism   . Inguinal hernia    reducible - on R side   . Ischemia of extremity 07/12/2013   Bilateral toes  2,3,4 right; 1,3 left  Vascular doppler normal for large vessel occlusion 2/15  . Lymphedema    R leg- in the past   . MDS (myelodysplastic syndrome), high grade ([place] [place]) 06/13/2014   7% blasts BM bx 05/05/14  . MDS (myelodysplastic syndrome), low grade ([place]) 09/20/2011   Bone marrow bx 06/13/11: mild dyserythro/dymegakaryopoiesis.  Normal cytogenetics.  FISH negative for chrom 5 or 7 deletions.  Scattered small infiltrates of plasmacytoid lymphs  . MGUS (monoclonal gammopathy of unknown significance) 06/17/2011  . Monocytosis 03/21/2014  . Osteoarthritis    "back, legs, arms, shoulders" (07/12/2013)  . Pancytopenia, acquired ([place]) 06/17/2011   "from my Myelodysplastic syndrome"  . Prostate cancer ([place])   . Senile purpura ([place]) 10/18/2014    Family History  Problem Relation Age of Onset  . Other Mother   . Other Father   . Heart disease Father   . Hyperlipidemia Son     Past Surgical History:  Procedure Laterality Date  . APPLICATION OF WOUND VAC Right 05/17/2015   Procedure: VAC placement;  Surgeon: Newt Minion, MD;  Location: [facility];  Service: Orthopedics;  Laterality: Right;  . CATARACT EXTRACTION W/ INTRAOCULAR LENS  IMPLANT, BILATERAL Bilateral ~ 2012  . I&D EXTREMITY Right 05/13/2015   Procedure: IRRIGATION AND DEBRIDEMENT ANKLE;  Surgeon: Newt Minion, MD;  Location: [facility];  Service: Orthopedics;   Laterality: Right;  . I&D EXTREMITY Right 05/17/2015   Procedure: IRRIGATION AND DEBRIDEMENT RIGHT LEG, APPLY SKIN GRAFT ;  Surgeon: Newt Minion, MD;  Location: [facility];  Service: Orthopedics;  Laterality: Right;  . I&D EXTREMITY Right 03/16/2016   Procedure: Excise Wound Right Leg, Apply Eunice Blase;  Surgeon: Newt Minion, MD;  Location: [facility];  Service: Orthopedics;  Laterality: Right;  . ROBOT ASSISTED LAPAROSCOPIC RADICAL PROSTATECTOMY  ~ 2010  . SKIN SPLIT GRAFT Right 12/06/2015   Procedure: APPLY SKIN GRAFT SPLIT THICKNESS TO RIGHT LEG;  Surgeon: Newt Minion, MD;  Location: [facility];  Service: Orthopedics;  Laterality: Right;  . SKIN SPLIT GRAFT Right 03/20/2016   Procedure: SKIN GRAFT SPLIT THICKNESS RIGHT LEG, APPLY THERASKIN AND WOUND VAC;  Surgeon: Newt Minion, MD;  Location: [facility];  Service: Orthopedics;  Laterality: Right;  . TONSILLECTOMY AND ADENOIDECTOMY  ~  1939   Social History   Occupational History  . Not on file.   Social History Main Topics  . Smoking status: Former Smoker    Years: 40.00    Types: Pipe    Quit date: 07/12/1993  . Smokeless tobacco: Never Used  . Alcohol use 4.2 oz/week    7 Shots of liquor per week     Comment: occasionally.- q evening- martini   . Drug use: No  . Sexual activity: Not on file

## 2016-04-15 ENCOUNTER — Encounter (HOSPITAL_COMMUNITY)
Admission: RE | Admit: 2016-04-15 | Discharge: 2016-04-15 | Disposition: A | Discharge status: Home / Self Care | Payer: Medicare Other | Source: Ambulatory Visit | Attending: Oncology | Admitting: Oncology

## 2016-04-15 DIAGNOSIS — D619 Aplastic anemia, unspecified: Secondary | ICD-10-CM | POA: Insufficient documentation

## 2016-04-15 DIAGNOSIS — D72821 Monocytosis (symptomatic): Secondary | ICD-10-CM | POA: Diagnosis present

## 2016-04-15 DIAGNOSIS — D4620 Refractory anemia with excess of blasts, unspecified: Secondary | ICD-10-CM

## 2016-04-15 DIAGNOSIS — D61818 Other pancytopenia: Secondary | ICD-10-CM

## 2016-04-15 DIAGNOSIS — D46Z Other myelodysplastic syndromes: Secondary | ICD-10-CM | POA: Diagnosis not present

## 2016-04-15 LAB — POCT HEMOGLOBIN-HEMACUE: HEMOGLOBIN: 8.5 g/dL — AB (ref 13.0–17.0)

## 2016-04-15 MED ORDER — DARBEPOETIN ALFA 150 MCG/0.3ML IJ SOSY
PREFILLED_SYRINGE | INTRAMUSCULAR | Status: AC
  Filled 2016-04-15: qty 0.6

## 2016-04-15 MED ORDER — DARBEPOETIN ALFA 300 MCG/0.6ML IJ SOSY
300.0000 ug | PREFILLED_SYRINGE | INTRAMUSCULAR | Status: DC
  Administered 2016-04-15: 13:00:00 300 ug via SUBCUTANEOUS
  Filled 2016-04-15: qty 0.6

## 2016-04-16 ENCOUNTER — Ambulatory Visit (INDEPENDENT_AMBULATORY_CARE_PROVIDER_SITE_OTHER): Payer: Medicare Other | Admitting: Family

## 2016-04-16 DIAGNOSIS — Z945 Skin transplant status: Secondary | ICD-10-CM | POA: Diagnosis not present

## 2016-04-16 NOTE — Progress Notes (Signed)
Office Visit Note   Patient: Joel Macdonald           Date of Birth: [birth year]           MRN: [id number] Visit Date: 04/16/2016              Requested by: Lajean Manes, MD [address] [place], [place] [address] PCP: Mathews Argyle, MD  No chief complaint on file.   HPI: Patient is an 83 year old gentleman seen today in follow-up status post split-thickness skin grafting to the right lower extremity on March 20, 2016. Have been using silver cell, ABDs and a Dynaflex wrapping. There is good wrinkling of the skin. Less drainage this visit. Did no saturate through the wrap. Is loose slippers today.      Assessment & Plan:     Visit Diagnoses:  1. Status post skin graft     Plan: Dynaflex wrap today with silver alginate over the skin graft. Encouraged him to elevate. Encouraged him to wear his slipper all week. He will follow-up in office on Friday for rewrapping.  Follow-Up Instructions: Return in about 3 days (around 04/19/2016).   Ortho Exam  Patient is alert and oriented. No adenopathy. Well-dressed. Normal affect. Respirations easy. Steady gait. Skin graft remains in place. Granulation tissue underlying with bleeding. No foul odor today.  No purulence or surrounding cellulitis.   Imaging: No results found.  Orders:  No orders of the defined types were placed in this encounter.  No orders of the defined types were placed in this encounter.    Procedures: No procedures performed   Clinical Data: No additional findings.   Subjective: Review of Systems  Constitutional: Negative for chills and fever.  Skin: Positive for wound.    Objective: Vital Signs: There were no vitals taken for this visit.  Specialty Comments:  No specialty comments available.  PMFS History: Patient Active Problem List   Diagnosis Date Noted  . Venous ulcer of right lower extremity with varicose veins ([place] [facility] [place]) 03/16/2016  . Abscess of right leg   . Status  post skin graft 03/11/2016  . Idiopathic chronic venous hypertension of right lower extremity with ulcer and inflammation ([place]) 03/04/2016  . Hypothyroidism 01/09/2016  . Enterococcus faecalis infection   . Abscess   . Abscess of skin 11/17/2014  . Senile purpura ([place]) 10/18/2014  . MDS (myelodysplastic syndrome), high grade ([place]) 06/13/2014  . Edema, peripheral 03/21/2014  . Monocytosis 03/21/2014  . Venous insufficiency (chronic) (peripheral) 08/03/2013  . Vasculitis ([place]) 08/02/2013  . Cellulitis of right lower limb 07/12/2013  . Ischemia of extremity 07/12/2013  . Cellulitis of leg, left 07/12/2013  . Cellulitis and abscess of leg 07/12/2013  . Compression fracture of T12 vertebra ([place]) 10/08/2011  . MDS (myelodysplastic syndrome), low grade ([place] [facility]) 09/20/2011  . Pancytopenia, acquired ([facility]) 06/17/2011  . MGUS (monoclonal gammopathy of unknown significance) 06/17/2011  . HIP PAIN, BILATERAL 11/07/2009  . COMPRESSION FRACTURE, THORACIC VERTEBRA 11/07/2009  . ADENOCARCINOMA, PROSTATE, GLEASON GRADE 7 10/17/2009  . BACK PAIN, LUMBAR 10/17/2009  . Sciatica 10/17/2009  . SKIN CANCER 06/05/2006  . HYPERTENSION, BENIGN SYSTEMIC 06/05/2006  . RHINITIS, ALLERGIC 06/05/2006  . REFLUX ESOPHAGITIS 06/05/2006   Past Medical History:  Diagnosis Date  . Abscess of skin 11/17/2014   Left ankle: incise & drain 11/17/14  . Cellulitis and abscess of leg 05/12/2015   rt leg  . Cellulitis of leg, right 07/12/2013  . Dyspnea   . Edema, peripheral 03/21/2014  .  GERD (gastroesophageal reflux disease)   . Heart murmur 1936  . History of blood transfusion    last episode- 04/2015  . Hypothyroidism   . Inguinal hernia    reducible - on R side   . Ischemia of extremity 07/12/2013   Bilateral toes  2,3,4 right; 1,3 left  Vascular doppler normal for large vessel occlusion 2/15  . Lymphedema    R leg- in the past   . MDS (myelodysplastic syndrome), high grade ([place]) 06/13/2014   7% blasts BM bx 05/05/14    . MDS (myelodysplastic syndrome), low grade ([place]) 09/20/2011   Bone marrow bx 06/13/11: mild dyserythro/dymegakaryopoiesis.  Normal cytogenetics.  FISH negative for chrom 5 or 7 deletions.  Scattered small infiltrates of plasmacytoid lymphs  . MGUS (monoclonal gammopathy of unknown significance) 06/17/2011  . Monocytosis 03/21/2014  . Osteoarthritis    "back, legs, arms, shoulders" (07/12/2013)  . Pancytopenia, acquired ([place]) 06/17/2011   "from my Myelodysplastic syndrome"  . Prostate cancer ([place])   . Senile purpura ([facility]) 10/18/2014    Family History  Problem Relation Age of Onset  . Other Mother   . Other Father   . Heart disease Father   . Hyperlipidemia Son     Past Surgical History:  Procedure Laterality Date  . APPLICATION OF WOUND VAC Right 05/17/2015   Procedure: VAC placement;  Surgeon: Newt Minion, MD;  Location: [facility];  Service: Orthopedics;  Laterality: Right;  . CATARACT EXTRACTION W/ INTRAOCULAR LENS  IMPLANT, BILATERAL Bilateral ~ 2012  . I&D EXTREMITY Right 05/13/2015   Procedure: IRRIGATION AND DEBRIDEMENT ANKLE;  Surgeon: Newt Minion, MD;  Location: [facility];  Service: Orthopedics;  Laterality: Right;  . I&D EXTREMITY Right 05/17/2015   Procedure: IRRIGATION AND DEBRIDEMENT RIGHT LEG, APPLY SKIN GRAFT ;  Surgeon: Newt Minion, MD;  Location: [facility];  Service: Orthopedics;  Laterality: Right;  . I&D EXTREMITY Right 03/16/2016   Procedure: Excise Wound Right Leg, Apply Eunice Blase;  Surgeon: Newt Minion, MD;  Location: [facility];  Service: Orthopedics;  Laterality: Right;  . ROBOT ASSISTED LAPAROSCOPIC RADICAL PROSTATECTOMY  ~ 2010  . SKIN SPLIT GRAFT Right 12/06/2015   Procedure: APPLY SKIN GRAFT SPLIT THICKNESS TO RIGHT LEG;  Surgeon: Newt Minion, MD;  Location: [facility];  Service: Orthopedics;  Laterality: Right;  . SKIN SPLIT GRAFT Right 03/20/2016   Procedure: SKIN GRAFT SPLIT THICKNESS RIGHT LEG, APPLY THERASKIN AND WOUND VAC;  Surgeon: Newt Minion, MD;  Location: [facility];   Service: Orthopedics;  Laterality: Right;  . TONSILLECTOMY AND ADENOIDECTOMY  ~ 1939   Social History   Occupational History  . Not on file.   Social History Main Topics  . Smoking status: Former Smoker    Years: 40.00    Types: Pipe    Quit date: 07/12/1993  . Smokeless tobacco: Never Used  . Alcohol use 4.2 oz/week    7 Shots of liquor per week     Comment: occasionally.- q evening- martini   . Drug use: No  . Sexual activity: Not on file

## 2016-04-19 ENCOUNTER — Encounter (INDEPENDENT_AMBULATORY_CARE_PROVIDER_SITE_OTHER): Payer: Self-pay | Admitting: Orthopedic Surgery

## 2016-04-19 ENCOUNTER — Ambulatory Visit (INDEPENDENT_AMBULATORY_CARE_PROVIDER_SITE_OTHER): Payer: Medicare Other | Admitting: Orthopedic Surgery

## 2016-04-19 VITALS — Ht 73.0 in | Wt 191.0 lb

## 2016-04-19 DIAGNOSIS — I83019 Varicose veins of right lower extremity with ulcer of unspecified site: Secondary | ICD-10-CM

## 2016-04-19 DIAGNOSIS — L97919 Non-pressure chronic ulcer of unspecified part of right lower leg with unspecified severity: Principal | ICD-10-CM

## 2016-04-19 NOTE — Progress Notes (Signed)
Office Visit Note   Patient: Joel Macdonald           Date of Birth: Oct 22, 1930           MRN: [id number] Visit Date: 04/19/2016              Requested by: Lajean Manes, MD [address] [place], [place] [address] PCP: Mathews Argyle, MD  Chief Complaint  Patient presents with  . Right Leg - Routine Post Op    Skin graft split thickness right leg and theraskin and wound vac. 03/20/16. 1 month post op.     HPI: Patient presents for follow up right leg status post skin grafting. There is odor, there is moderate amount of drainage. Silver cell and dynaflex compression dressing was removed in office today. Dressing was pushed up the forefoot as result of patient trying to slide bedroom slippers on and as a result has pitting edema in this area. He denies any pain.    Patient without success drainage through the Dynaflex wraps Assessment & Plan: Visit Diagnoses:  1. Venous ulcer of right lower extremity with varicose veins (HCC)     Plan: We'll start daily dressing changes wash with soap and water in the shower 4 x 4 plus Silvadene plus Ace wrap elevation follow-up in 1 week  Follow-Up Instructions: Return in about 1 week (around 04/26/2016).   Ortho Exam Examination patient has increased drainage and maceration there was exudative tissue was debrided from the wound there is a good healthy viable wound base there is no cellulitis no odor no signs of infection.  Imaging: No results found.  Orders:  No orders of the defined types were placed in this encounter.  No orders of the defined types were placed in this encounter.    Procedures: No procedures performed  Clinical Data: No additional findings.  Subjective: Review of Systems  Objective: Vital Signs: Ht 6\' 1"  (1.854 m)   Wt 191 lb (86.6 kg)   BMI 25.20 kg/m   Specialty Comments:  No specialty comments available.  PMFS History: Patient Active Problem List   Diagnosis Date Noted  . Venous  ulcer of right lower extremity with varicose veins ([facility]) 03/16/2016  . Abscess of right leg   . Status post skin graft 03/11/2016  . Idiopathic chronic venous hypertension of right lower extremity with ulcer and inflammation ([place]) 03/04/2016  . Hypothyroidism 01/09/2016  . Enterococcus faecalis infection   . Abscess   . Abscess of skin 11/17/2014  . Senile purpura ([facility]) 10/18/2014  . MDS (myelodysplastic syndrome), high grade ([facility]) 06/13/2014  . Edema, peripheral 03/21/2014  . Monocytosis 03/21/2014  . Venous insufficiency (chronic) (peripheral) 08/03/2013  . Vasculitis ([facility]) 08/02/2013  . Cellulitis of right lower limb 07/12/2013  . Ischemia of extremity 07/12/2013  . Cellulitis of leg, left 07/12/2013  . Cellulitis and abscess of leg 07/12/2013  . Compression fracture of T12 vertebra ([facility]) 10/08/2011  . MDS (myelodysplastic syndrome), low grade ([facility]) 09/20/2011  . Pancytopenia, acquired ([place]) 06/17/2011  . MGUS (monoclonal gammopathy of unknown significance) 06/17/2011  . HIP PAIN, BILATERAL 11/07/2009  . COMPRESSION FRACTURE, THORACIC VERTEBRA 11/07/2009  . ADENOCARCINOMA, PROSTATE, GLEASON GRADE 7 10/17/2009  . BACK PAIN, LUMBAR 10/17/2009  . Sciatica 10/17/2009  . SKIN CANCER 06/05/2006  . HYPERTENSION, BENIGN SYSTEMIC 06/05/2006  . RHINITIS, ALLERGIC 06/05/2006  . REFLUX ESOPHAGITIS 06/05/2006   Past Medical History:  Diagnosis Date  . Abscess of skin 11/17/2014   Left ankle:  incise & drain 11/17/14  . Cellulitis and abscess of leg 05/12/2015   rt leg  . Cellulitis of leg, right 07/12/2013  . Dyspnea   . Edema, peripheral 03/21/2014  . GERD (gastroesophageal reflux disease)   . Heart murmur 1936  . History of blood transfusion    last episode- 04/2015  . Hypothyroidism   . Inguinal hernia    reducible - on R side   . Ischemia of extremity 07/12/2013   Bilateral toes  2,3,4 right; 1,3 left  Vascular doppler normal for large vessel occlusion 2/15  . Lymphedema    R  leg- in the past   . MDS (myelodysplastic syndrome), high grade ([facility]) 06/13/2014   7% blasts BM bx 05/05/14  . MDS (myelodysplastic syndrome), low grade ([facility]) 09/20/2011   Bone marrow bx 06/13/11: mild dyserythro/dymegakaryopoiesis.  Normal cytogenetics.  FISH negative for chrom 5 or 7 deletions.  Scattered small infiltrates of plasmacytoid lymphs  . MGUS (monoclonal gammopathy of unknown significance) 06/17/2011  . Monocytosis 03/21/2014  . Osteoarthritis    "back, legs, arms, shoulders" (07/12/2013)  . Pancytopenia, acquired ([facility]) 06/17/2011   "from my Myelodysplastic syndrome"  . Prostate cancer ([facility])   . Senile purpura ([facility] [place]) 10/18/2014    Family History  Problem Relation Age of Onset  . Other Mother   . Other Father   . Heart disease Father   . Hyperlipidemia Son     Past Surgical History:  Procedure Laterality Date  . APPLICATION OF WOUND VAC Right 05/17/2015   Procedure: VAC placement;  Surgeon: Newt Minion, MD;  Location: [facility];  Service: Orthopedics;  Laterality: Right;  . CATARACT EXTRACTION W/ INTRAOCULAR LENS  IMPLANT, BILATERAL Bilateral ~ 2012  . I&D EXTREMITY Right 05/13/2015   Procedure: IRRIGATION AND DEBRIDEMENT ANKLE;  Surgeon: Newt Minion, MD;  Location: [facility];  Service: Orthopedics;  Laterality: Right;  . I&D EXTREMITY Right 05/17/2015   Procedure: IRRIGATION AND DEBRIDEMENT RIGHT LEG, APPLY SKIN GRAFT ;  Surgeon: Newt Minion, MD;  Location: [facility];  Service: Orthopedics;  Laterality: Right;  . I&D EXTREMITY Right 03/16/2016   Procedure: Excise Wound Right Leg, Apply Eunice Blase;  Surgeon: Newt Minion, MD;  Location: [facility];  Service: Orthopedics;  Laterality: Right;  . ROBOT ASSISTED LAPAROSCOPIC RADICAL PROSTATECTOMY  ~ 2010  . SKIN SPLIT GRAFT Right 12/06/2015   Procedure: APPLY SKIN GRAFT SPLIT THICKNESS TO RIGHT LEG;  Surgeon: Newt Minion, MD;  Location: [facility];  Service: Orthopedics;  Laterality: Right;  . SKIN SPLIT GRAFT Right 03/20/2016   Procedure: SKIN GRAFT  SPLIT THICKNESS RIGHT LEG, APPLY THERASKIN AND WOUND VAC;  Surgeon: Newt Minion, MD;  Location: [facility];  Service: Orthopedics;  Laterality: Right;  . TONSILLECTOMY AND ADENOIDECTOMY  ~ 1939   Social History   Occupational History  . Not on file.   Social History Main Topics  . Smoking status: Former Smoker    Years: 40.00    Types: Pipe    Quit date: 07/12/1993  . Smokeless tobacco: Never Used  . Alcohol use 4.2 oz/week    7 Shots of liquor per week     Comment: occasionally.- q evening- martini   . Drug use: No  . Sexual activity: Not on file

## 2016-04-26 ENCOUNTER — Ambulatory Visit (INDEPENDENT_AMBULATORY_CARE_PROVIDER_SITE_OTHER): Payer: Medicare Other | Admitting: Family

## 2016-04-26 DIAGNOSIS — D61818 Other pancytopenia: Secondary | ICD-10-CM

## 2016-04-26 DIAGNOSIS — R5383 Other fatigue: Secondary | ICD-10-CM

## 2016-04-26 DIAGNOSIS — Z945 Skin transplant status: Secondary | ICD-10-CM

## 2016-04-26 LAB — CBC
HCT: 24.1 % — ABNORMAL LOW (ref 38.5–50.0)
HEMOGLOBIN: 7.9 g/dL — AB (ref 13.2–17.1)
MCH: 32.2 pg (ref 27.0–33.0)
MCHC: 32.8 g/dL (ref 32.0–36.0)
MCV: 98.4 fL (ref 80.0–100.0)
Platelets: 88 10*3/uL — ABNORMAL LOW (ref 140–400)
RBC: 2.45 MIL/uL — AB (ref 4.20–5.80)
RDW: 20.6 % — ABNORMAL HIGH (ref 11.0–15.0)
WBC: 17.3 10*3/uL — AB (ref 3.8–10.8)

## 2016-04-26 NOTE — Progress Notes (Signed)
Office Visit Note   Patient: Joel Macdonald           Date of Birth: [birth year]           MRN: [id number] Visit Date: 04/26/2016              Requested by: Lajean Manes, MD [address], [place] [address] PCP: Mathews Argyle, MD  Chief Complaint  Patient presents with  . Right Leg - Wound Check    HPI: Patient is an 83 year old gentleman seen today in follow up. Is status post split thickness skin grafting to the right lateral lower extremity. Has been applying silvadene dressings daily and wrapping with ace wrap. Feels this is working well. Has had some pain in foot and lower extremity from swelling. Continued drainage, has drained through 2 ABD pads in just one day.   Also complaining of fatigue and weakness. Has a history of myelodysplastic syndrome. Last Hemoglobin 04/15/16 was 8. Feels it may be lower. Has required blood transfusion in past. Concerned he may need another. Has been having considerable drainage from the lower extremity ulcer.     Assessment & Plan: Visit Diagnoses:  1. Status post skin graft   2. Other fatigue   3. Pancytopenia, acquired [facility])     Plan: Will draw a CBC today. Continue with daily wound cleansing. Silvadene dressings. Ace wrap for compression. Follow up in office in 1 week. Will call him with CBC results.   Follow-Up Instructions: Return in about 1 week (around 2016-05-19).   Ortho Exam Patient is alert and oriented. No adenopathy. Well-dressed. Normal affect. Short of breath following walk to exam room. Pale. Right lower leg: there is moderate edema. Lateral ulcer with graft still in wound bed. Granulation tissue in wound bed. No exudative tissue. No purulence or foul odor. No surrounding erythema or cellulitis.    Imaging: No results found.  Orders:  No orders of the defined types were placed in this encounter.  No orders of the defined types were placed in this encounter.    Procedures: No procedures  performed  Clinical Data: No additional findings.  Subjective: Review of Systems  Constitutional: Positive for fatigue. Negative for chills and fever.  Respiratory: Positive for shortness of breath.   Skin: Positive for pallor and wound.    Objective: Vital Signs: There were no vitals taken for this visit.  Specialty Comments:  No specialty comments available.  PMFS History: Patient Active Problem List   Diagnosis Date Noted  . Venous ulcer of right lower extremity with varicose veins ([place]) 03/16/2016  . Abscess of right leg   . Status post skin graft 03/11/2016  . Idiopathic chronic venous hypertension of right lower extremity with ulcer and inflammation ([place]) 03/04/2016  . Hypothyroidism 01/09/2016  . Enterococcus faecalis infection   . Abscess   . Abscess of skin 11/17/2014  . Senile purpura ([place]) 10/18/2014  . MDS (myelodysplastic syndrome), high grade ([place]) 06/13/2014  . Edema, peripheral 03/21/2014  . Monocytosis 03/21/2014  . Venous insufficiency (chronic) (peripheral) 08/03/2013  . Vasculitis ([place]) 08/02/2013  . Cellulitis of right lower limb 07/12/2013  . Ischemia of extremity 07/12/2013  . Cellulitis of leg, left 07/12/2013  . Cellulitis and abscess of leg 07/12/2013  . Compression fracture of T12 vertebra ([place]) 10/08/2011  . MDS (myelodysplastic syndrome), low grade ([place]) 09/20/2011  . Pancytopenia, acquired ([place]) 06/17/2011  . MGUS (monoclonal gammopathy of unknown significance) 06/17/2011  . HIP PAIN,  BILATERAL 11/07/2009  . COMPRESSION FRACTURE, THORACIC VERTEBRA 11/07/2009  . ADENOCARCINOMA, PROSTATE, GLEASON GRADE 7 10/17/2009  . BACK PAIN, LUMBAR 10/17/2009  . Sciatica 10/17/2009  . SKIN CANCER 06/05/2006  . HYPERTENSION, BENIGN SYSTEMIC 06/05/2006  . RHINITIS, ALLERGIC 06/05/2006  . REFLUX ESOPHAGITIS 06/05/2006   Past Medical History:  Diagnosis Date  . Abscess of skin 11/17/2014   Left ankle: incise & drain 11/17/14  . Cellulitis and abscess  of leg 05/12/2015   rt leg  . Cellulitis of leg, right 07/12/2013  . Dyspnea   . Edema, peripheral 03/21/2014  . GERD (gastroesophageal reflux disease)   . Heart murmur 1936  . History of blood transfusion    last episode- 04/2015  . Hypothyroidism   . Inguinal hernia    reducible - on R side   . Ischemia of extremity 07/12/2013   Bilateral toes  2,3,4 right; 1,3 left  Vascular doppler normal for large vessel occlusion 2/15  . Lymphedema    R leg- in the past   . MDS (myelodysplastic syndrome), high grade ([place]) 06/13/2014   7% blasts BM bx 05/05/14  . MDS (myelodysplastic syndrome), low grade ([place]) 09/20/2011   Bone marrow bx 06/13/11: mild dyserythro/dymegakaryopoiesis.  Normal cytogenetics.  FISH negative for chrom 5 or 7 deletions.  Scattered small infiltrates of plasmacytoid lymphs  . MGUS (monoclonal gammopathy of unknown significance) 06/17/2011  . Monocytosis 03/21/2014  . Osteoarthritis    "back, legs, arms, shoulders" (07/12/2013)  . Pancytopenia, acquired ([place]) 06/17/2011   "from my Myelodysplastic syndrome"  . Prostate cancer ([place])   . Senile purpura ([place]) 10/18/2014    Family History  Problem Relation Age of Onset  . Other Mother   . Other Father   . Heart disease Father   . Hyperlipidemia Son     Past Surgical History:  Procedure Laterality Date  . APPLICATION OF WOUND VAC Right 05/17/2015   Procedure: VAC placement;  Surgeon: Newt Minion, MD;  Location: [facility];  Service: Orthopedics;  Laterality: Right;  . CATARACT EXTRACTION W/ INTRAOCULAR LENS  IMPLANT, BILATERAL Bilateral ~ 2012  . I&D EXTREMITY Right 05/13/2015   Procedure: IRRIGATION AND DEBRIDEMENT ANKLE;  Surgeon: Newt Minion, MD;  Location: [facility];  Service: Orthopedics;  Laterality: Right;  . I&D EXTREMITY Right 05/17/2015   Procedure: IRRIGATION AND DEBRIDEMENT RIGHT LEG, APPLY SKIN GRAFT ;  Surgeon: Newt Minion, MD;  Location: [facility];  Service: Orthopedics;  Laterality: Right;  . I&D EXTREMITY Right 03/16/2016    Procedure: Excise Wound Right Leg, Apply Eunice Blase;  Surgeon: Newt Minion, MD;  Location: [facility];  Service: Orthopedics;  Laterality: Right;  . ROBOT ASSISTED LAPAROSCOPIC RADICAL PROSTATECTOMY  ~ 2010  . SKIN SPLIT GRAFT Right 12/06/2015   Procedure: APPLY SKIN GRAFT SPLIT THICKNESS TO RIGHT LEG;  Surgeon: Newt Minion, MD;  Location: [facility];  Service: Orthopedics;  Laterality: Right;  . SKIN SPLIT GRAFT Right 03/20/2016   Procedure: SKIN GRAFT SPLIT THICKNESS RIGHT LEG, APPLY THERASKIN AND WOUND VAC;  Surgeon: Newt Minion, MD;  Location: [facility];  Service: Orthopedics;  Laterality: Right;  . TONSILLECTOMY AND ADENOIDECTOMY  ~ 1939   Social History   Occupational History  . Not on file.   Social History Main Topics  . Smoking status: Former Smoker    Years: 40.00    Types: Pipe    Quit date: 07/12/1993  . Smokeless tobacco: Never Used  . Alcohol use 4.2 oz/week  7 Shots of liquor per week     Comment: occasionally.- q evening- martini   . Drug use: No  . Sexual activity: Not on file

## 2016-04-27 ENCOUNTER — Telehealth (INDEPENDENT_AMBULATORY_CARE_PROVIDER_SITE_OTHER): Payer: Self-pay | Admitting: Family

## 2016-04-29 ENCOUNTER — Encounter (HOSPITAL_COMMUNITY): Payer: Self-pay

## 2016-04-29 ENCOUNTER — Encounter: Payer: Self-pay | Admitting: Oncology

## 2016-04-29 ENCOUNTER — Inpatient Hospital Stay (HOSPITAL_COMMUNITY): Payer: Medicare Other

## 2016-04-29 ENCOUNTER — Inpatient Hospital Stay (HOSPITAL_COMMUNITY)
Admission: AD | Admit: 2016-04-29 | Discharge: 2016-05-09 | DRG: 834 | Disposition: E | Payer: Medicare Other | Source: Ambulatory Visit | Attending: Internal Medicine | Admitting: Internal Medicine

## 2016-04-29 ENCOUNTER — Encounter (HOSPITAL_COMMUNITY)
Admission: RE | Admit: 2016-04-29 | Discharge: 2016-04-29 | Disposition: A | Discharge status: Home / Self Care | Payer: Medicare Other | Source: Ambulatory Visit | Attending: Oncology | Admitting: Oncology

## 2016-04-29 ENCOUNTER — Ambulatory Visit (INDEPENDENT_AMBULATORY_CARE_PROVIDER_SITE_OTHER): Payer: Medicare Other | Admitting: Oncology

## 2016-04-29 ENCOUNTER — Other Ambulatory Visit: Payer: Self-pay | Admitting: Oncology

## 2016-04-29 VITALS — BP 145/72 | HR 98 | Temp 98.3°F | Ht 73.0 in | Wt 193.2 lb

## 2016-04-29 VITALS — BP 142/70 | HR 0 | Temp 98.8°F | Resp 20 | Ht 73.0 in | Wt 183.0 lb

## 2016-04-29 DIAGNOSIS — I83009 Varicose veins of unspecified lower extremity with ulcer of unspecified site: Secondary | ICD-10-CM | POA: Diagnosis present

## 2016-04-29 DIAGNOSIS — I872 Venous insufficiency (chronic) (peripheral): Secondary | ICD-10-CM | POA: Diagnosis present

## 2016-04-29 DIAGNOSIS — Z515 Encounter for palliative care: Secondary | ICD-10-CM | POA: Diagnosis not present

## 2016-04-29 DIAGNOSIS — J9601 Acute respiratory failure with hypoxia: Secondary | ICD-10-CM | POA: Diagnosis present

## 2016-04-29 DIAGNOSIS — L97919 Non-pressure chronic ulcer of unspecified part of right lower leg with unspecified severity: Secondary | ICD-10-CM | POA: Diagnosis present

## 2016-04-29 DIAGNOSIS — D4620 Refractory anemia with excess of blasts, unspecified: Secondary | ICD-10-CM | POA: Diagnosis present

## 2016-04-29 DIAGNOSIS — F419 Anxiety disorder, unspecified: Secondary | ICD-10-CM | POA: Diagnosis present

## 2016-04-29 DIAGNOSIS — D46Z Other myelodysplastic syndromes: Secondary | ICD-10-CM

## 2016-04-29 DIAGNOSIS — R0600 Dyspnea, unspecified: Secondary | ICD-10-CM

## 2016-04-29 DIAGNOSIS — Z7982 Long term (current) use of aspirin: Secondary | ICD-10-CM

## 2016-04-29 DIAGNOSIS — E874 Mixed disorder of acid-base balance: Secondary | ICD-10-CM | POA: Diagnosis present

## 2016-04-29 DIAGNOSIS — D61818 Other pancytopenia: Secondary | ICD-10-CM

## 2016-04-29 DIAGNOSIS — E872 Acidosis: Secondary | ICD-10-CM | POA: Diagnosis not present

## 2016-04-29 DIAGNOSIS — C9300 Acute monoblastic/monocytic leukemia, not having achieved remission: Principal | ICD-10-CM | POA: Diagnosis present

## 2016-04-29 DIAGNOSIS — E039 Hypothyroidism, unspecified: Secondary | ICD-10-CM | POA: Diagnosis present

## 2016-04-29 DIAGNOSIS — R0902 Hypoxemia: Secondary | ICD-10-CM

## 2016-04-29 DIAGNOSIS — D62 Acute posthemorrhagic anemia: Secondary | ICD-10-CM | POA: Diagnosis not present

## 2016-04-29 DIAGNOSIS — D649 Anemia, unspecified: Secondary | ICD-10-CM

## 2016-04-29 DIAGNOSIS — E790 Hyperuricemia without signs of inflammatory arthritis and tophaceous disease: Secondary | ICD-10-CM

## 2016-04-29 DIAGNOSIS — Z7189 Other specified counseling: Secondary | ICD-10-CM | POA: Diagnosis not present

## 2016-04-29 DIAGNOSIS — E876 Hypokalemia: Secondary | ICD-10-CM | POA: Diagnosis present

## 2016-04-29 DIAGNOSIS — N179 Acute kidney failure, unspecified: Secondary | ICD-10-CM | POA: Diagnosis present

## 2016-04-29 DIAGNOSIS — D72828 Other elevated white blood cell count: Secondary | ICD-10-CM | POA: Diagnosis not present

## 2016-04-29 DIAGNOSIS — Z87891 Personal history of nicotine dependence: Secondary | ICD-10-CM

## 2016-04-29 DIAGNOSIS — J984 Other disorders of lung: Secondary | ICD-10-CM | POA: Diagnosis not present

## 2016-04-29 DIAGNOSIS — D72829 Elevated white blood cell count, unspecified: Secondary | ICD-10-CM | POA: Diagnosis present

## 2016-04-29 DIAGNOSIS — C9590 Leukemia, unspecified not having achieved remission: Secondary | ICD-10-CM | POA: Diagnosis not present

## 2016-04-29 DIAGNOSIS — L97929 Non-pressure chronic ulcer of unspecified part of left lower leg with unspecified severity: Secondary | ICD-10-CM

## 2016-04-29 DIAGNOSIS — R58 Hemorrhage, not elsewhere classified: Secondary | ICD-10-CM | POA: Diagnosis present

## 2016-04-29 DIAGNOSIS — D696 Thrombocytopenia, unspecified: Secondary | ICD-10-CM | POA: Diagnosis present

## 2016-04-29 DIAGNOSIS — J301 Allergic rhinitis due to pollen: Secondary | ICD-10-CM

## 2016-04-29 DIAGNOSIS — Z79899 Other long term (current) drug therapy: Secondary | ICD-10-CM

## 2016-04-29 DIAGNOSIS — D500 Iron deficiency anemia secondary to blood loss (chronic): Secondary | ICD-10-CM | POA: Diagnosis not present

## 2016-04-29 DIAGNOSIS — D472 Monoclonal gammopathy: Secondary | ICD-10-CM

## 2016-04-29 DIAGNOSIS — Z66 Do not resuscitate: Secondary | ICD-10-CM | POA: Diagnosis present

## 2016-04-29 DIAGNOSIS — C9200 Acute myeloblastic leukemia, not having achieved remission: Secondary | ICD-10-CM

## 2016-04-29 DIAGNOSIS — Z8546 Personal history of malignant neoplasm of prostate: Secondary | ICD-10-CM

## 2016-04-29 DIAGNOSIS — J9691 Respiratory failure, unspecified with hypoxia: Secondary | ICD-10-CM | POA: Insufficient documentation

## 2016-04-29 DIAGNOSIS — D72821 Monocytosis (symptomatic): Secondary | ICD-10-CM

## 2016-04-29 DIAGNOSIS — M549 Dorsalgia, unspecified: Secondary | ICD-10-CM | POA: Diagnosis present

## 2016-04-29 DIAGNOSIS — Z881 Allergy status to other antibiotic agents status: Secondary | ICD-10-CM | POA: Diagnosis not present

## 2016-04-29 DIAGNOSIS — I10 Essential (primary) hypertension: Secondary | ICD-10-CM | POA: Diagnosis present

## 2016-04-29 DIAGNOSIS — I83019 Varicose veins of right lower extremity with ulcer of unspecified site: Secondary | ICD-10-CM | POA: Diagnosis present

## 2016-04-29 DIAGNOSIS — R041 Hemorrhage from throat: Secondary | ICD-10-CM | POA: Diagnosis not present

## 2016-04-29 DIAGNOSIS — R5383 Other fatigue: Secondary | ICD-10-CM | POA: Diagnosis present

## 2016-04-29 DIAGNOSIS — E873 Alkalosis: Secondary | ICD-10-CM | POA: Diagnosis present

## 2016-04-29 DIAGNOSIS — K219 Gastro-esophageal reflux disease without esophagitis: Secondary | ICD-10-CM | POA: Diagnosis present

## 2016-04-29 DIAGNOSIS — E8770 Fluid overload, unspecified: Secondary | ICD-10-CM | POA: Diagnosis not present

## 2016-04-29 DIAGNOSIS — M1990 Unspecified osteoarthritis, unspecified site: Secondary | ICD-10-CM | POA: Diagnosis present

## 2016-04-29 DIAGNOSIS — L97219 Non-pressure chronic ulcer of right calf with unspecified severity: Secondary | ICD-10-CM | POA: Diagnosis not present

## 2016-04-29 DIAGNOSIS — Z9889 Other specified postprocedural states: Secondary | ICD-10-CM | POA: Diagnosis not present

## 2016-04-29 DIAGNOSIS — I83012 Varicose veins of right lower extremity with ulcer of calf: Secondary | ICD-10-CM | POA: Diagnosis not present

## 2016-04-29 DIAGNOSIS — E883 Tumor lysis syndrome: Secondary | ICD-10-CM | POA: Diagnosis present

## 2016-04-29 DIAGNOSIS — Z8249 Family history of ischemic heart disease and other diseases of the circulatory system: Secondary | ICD-10-CM

## 2016-04-29 DIAGNOSIS — L97909 Non-pressure chronic ulcer of unspecified part of unspecified lower leg with unspecified severity: Secondary | ICD-10-CM

## 2016-04-29 DIAGNOSIS — D689 Coagulation defect, unspecified: Secondary | ICD-10-CM | POA: Diagnosis not present

## 2016-04-29 DIAGNOSIS — E871 Hypo-osmolality and hyponatremia: Secondary | ICD-10-CM | POA: Diagnosis present

## 2016-04-29 DIAGNOSIS — R34 Anuria and oliguria: Secondary | ICD-10-CM | POA: Diagnosis not present

## 2016-04-29 HISTORY — DX: Myelodysplastic syndrome, unspecified: D46.9

## 2016-04-29 LAB — COMPREHENSIVE METABOLIC PANEL
ALBUMIN: 3.3 g/dL — AB (ref 3.5–5.0)
ALK PHOS: 178 U/L — AB (ref 38–126)
ALT: 28 U/L (ref 17–63)
ANION GAP: 18 — AB (ref 5–15)
AST: 132 U/L — AB (ref 15–41)
BUN: 39 mg/dL — AB (ref 6–20)
CO2: 20 mmol/L — AB (ref 22–32)
Calcium: 7.5 mg/dL — ABNORMAL LOW (ref 8.9–10.3)
Chloride: 97 mmol/L — ABNORMAL LOW (ref 101–111)
Creatinine, Ser: 2.64 mg/dL — ABNORMAL HIGH (ref 0.61–1.24)
GFR calc Af Amer: 24 mL/min — ABNORMAL LOW (ref >60)
GFR calc non Af Amer: 21 mL/min — ABNORMAL LOW (ref >60)
GLUCOSE: 94 mg/dL (ref 65–99)
POTASSIUM: 3.4 mmol/L — AB (ref 3.5–5.1)
SODIUM: 135 mmol/L (ref 135–145)
Total Bilirubin: 1.5 mg/dL — ABNORMAL HIGH (ref 0.3–1.2)
Total Protein: 7.6 g/dL (ref 6.5–8.1)

## 2016-04-29 LAB — PREPARE RBC (CROSSMATCH)

## 2016-04-29 LAB — URINALYSIS, ROUTINE W REFLEX MICROSCOPIC
Bilirubin Urine: NEGATIVE
Glucose, UA: NEGATIVE mg/dL
Ketones, ur: 5 mg/dL — AB
LEUKOCYTES UA: NEGATIVE
Nitrite: NEGATIVE
PROTEIN: 100 mg/dL — AB
SPECIFIC GRAVITY, URINE: 1.016 (ref 1.005–1.030)
pH: 5 (ref 5.0–8.0)

## 2016-04-29 LAB — CBC WITH DIFFERENTIAL/PLATELET
BAND NEUTROPHILS: 19 %
BASOS ABS: 0 10*3/uL (ref 0.0–0.1)
BASOS PCT: 0 %
Blasts: 0 %
EOS ABS: 1.6 10*3/uL — AB (ref 0.0–0.7)
Eosinophils Relative: 4 %
HCT: 22.3 % — ABNORMAL LOW (ref 39.0–52.0)
Hemoglobin: 7.3 g/dL — ABNORMAL LOW (ref 13.0–17.0)
LYMPHS ABS: 11.2 10*3/uL — AB (ref 0.7–4.0)
LYMPHS PCT: 28 %
MCH: 31.3 pg (ref 26.0–34.0)
MCHC: 32.7 g/dL (ref 30.0–36.0)
MCV: 95.7 fL (ref 78.0–100.0)
METAMYELOCYTES PCT: 4 %
MONO ABS: 5.6 10*3/uL — AB (ref 0.1–1.0)
Monocytes Relative: 14 %
Myelocytes: 4 %
NEUTROS ABS: 21.6 10*3/uL — AB (ref 1.7–7.7)
Neutrophils Relative %: 27 %
OTHER: 0 %
PLATELETS: 93 10*3/uL — AB (ref 150–400)
Promyelocytes Absolute: 0 %
RBC: 2.33 MIL/uL — ABNORMAL LOW (ref 4.22–5.81)
RDW: 21.6 % — AB (ref 11.5–15.5)
WBC Morphology: INCREASED
WBC: 40 10*3/uL — ABNORMAL HIGH (ref 4.0–10.5)
nRBC: 0 /100 WBC

## 2016-04-29 LAB — BLOOD GAS, ARTERIAL
Acid-base deficit: 2.9 mmol/L — ABNORMAL HIGH (ref 0.0–2.0)
Bicarbonate: 20.3 mmol/L (ref 20.0–28.0)
Drawn by: 246101
FIO2: 0.21
O2 SAT: 84.9 %
PH ART: 7.463 — AB (ref 7.350–7.450)
Patient temperature: 98.6
pCO2 arterial: 28.8 mmHg — ABNORMAL LOW (ref 32.0–48.0)
pO2, Arterial: 54.6 mmHg — ABNORMAL LOW (ref 83.0–108.0)

## 2016-04-29 LAB — INFLUENZA PANEL BY PCR (TYPE A & B)
INFLBPCR: NEGATIVE
Influenza A By PCR: NEGATIVE

## 2016-04-29 LAB — LACTIC ACID, PLASMA: Lactic Acid, Venous: 1.1 mmol/L (ref 0.5–1.9)

## 2016-04-29 LAB — SAVE SMEAR

## 2016-04-29 LAB — SODIUM, URINE, RANDOM: SODIUM UR: 29 mmol/L

## 2016-04-29 LAB — CREATININE, URINE, RANDOM: CREATININE, URINE: 154.52 mg/dL

## 2016-04-29 MED ORDER — LEVOTHYROXINE SODIUM 50 MCG PO TABS
50.0000 ug | ORAL_TABLET | Freq: Every day | ORAL | Status: DC
  Administered 2016-04-30 – 2016-05-02 (×3): 50 ug via ORAL
  Filled 2016-04-29 (×3): qty 1

## 2016-04-29 MED ORDER — SODIUM CHLORIDE 0.9 % IV SOLN: Freq: Once | INTRAVENOUS | Status: DC

## 2016-04-29 MED ORDER — POTASSIUM CHLORIDE CRYS ER 20 MEQ PO TBCR
40.0000 meq | EXTENDED_RELEASE_TABLET | Freq: Once | ORAL | Status: AC
  Administered 2016-04-29: 40 meq via ORAL
  Filled 2016-04-29: qty 2

## 2016-04-29 MED ORDER — SODIUM CHLORIDE 0.9 % IV SOLN
INTRAVENOUS | Status: AC
  Administered 2016-04-29 – 2016-04-30 (×2): via INTRAVENOUS

## 2016-04-29 MED ORDER — ENOXAPARIN SODIUM 30 MG/0.3ML ~~LOC~~ SOLN
30.0000 mg | SUBCUTANEOUS | Status: DC
  Administered 2016-04-30: 30 mg via SUBCUTANEOUS
  Filled 2016-04-29: qty 0.3

## 2016-04-29 MED ORDER — TECHNETIUM TO 99M ALBUMIN AGGREGATED: 4.1000 | Freq: Once | INTRAVENOUS | Status: DC | PRN

## 2016-04-29 MED ORDER — CEFEPIME HCL 1 G IJ SOLR
1.0000 g | INTRAMUSCULAR | Status: DC
Start: 2016-04-29 — End: 2016-04-29
  Filled 2016-04-29: qty 1

## 2016-04-29 MED ORDER — SILVER SULFADIAZINE 1 % EX CREA
TOPICAL_CREAM | Freq: Every day | CUTANEOUS | Status: DC
  Administered 2016-04-29 – 2016-05-01 (×3): via TOPICAL
  Filled 2016-04-29: qty 85

## 2016-04-29 MED ORDER — FAMOTIDINE 20 MG PO TABS: 20.0000 mg | ORAL_TABLET | Freq: Every day | ORAL | Status: DC | PRN

## 2016-04-29 MED ORDER — POLYETHYLENE GLYCOL 3350 17 G PO PACK: 17.0000 g | PACK | Freq: Every day | ORAL | Status: DC | PRN

## 2016-04-29 MED ORDER — FLUTICASONE PROPIONATE 50 MCG/ACT NA SUSP
1.0000 | Freq: Every day | NASAL | Status: DC | PRN
  Filled 2016-04-29: qty 16

## 2016-04-29 MED ORDER — ENOXAPARIN SODIUM 100 MG/ML ~~LOC~~ SOLN
90.0000 mg | Freq: Once | SUBCUTANEOUS | Status: AC
  Administered 2016-04-29: 90 mg via SUBCUTANEOUS
  Filled 2016-04-29: qty 1

## 2016-04-29 MED ORDER — SODIUM CHLORIDE 0.9 % IV SOLN
Freq: Once | INTRAVENOUS | Status: AC
  Administered 2016-04-29: 16:00:00 via INTRAVENOUS

## 2016-04-29 MED ORDER — TECHNETIUM TC 99M DIETHYLENETRIAME-PENTAACETIC ACID: 30.0000 | Freq: Once | INTRAVENOUS | Status: DC | PRN

## 2016-04-29 MED ORDER — SODIUM CHLORIDE 0.9% FLUSH
3.0000 mL | Freq: Two times a day (BID) | INTRAVENOUS | Status: DC
Start: 2016-04-29 — End: 2016-05-03
  Administered 2016-04-29 – 2016-05-01 (×3): 3 mL via INTRAVENOUS

## 2016-04-29 NOTE — Progress Notes (Signed)
Pt came in today for blood transfusion.  [name] called this morning and requested that we give the patient 1 unit of blood today and ne needed one more for tomorrow.  Pt came up to our unit from Dr Rosemarie Ax office and he was very tachypneic and IT was hard for him to talk.  [name]  Put orders in for  A stat portable chest xray and a stat ABG and a stat VQ scan.  Pt is going to be admitted to tele.  He is currently in his VQ scan.  Blood bank called and said that the blood was ready but he is in procedure.

## 2016-04-29 NOTE — Progress Notes (Signed)
ANTIBIOTIC CONSULT NOTE - INITIAL  Pharmacy Consult for Cefepime Indication: sepsis  Allergies  Allergen Reactions  . Doxycycline Nausea And Vomiting    Upset stomach   . Erythromycin Nausea And Vomiting    Minor upset stomach - pt tolerates med ok   . Other Other (See Comments)    [facility] >> NASAL CONGESTION    Patient Measurements: Height: 6\' 1"  (185.4 cm) Weight: 182 lb 15.7 oz (83 kg) IBW/kg (Calculated) : 79.9 Adjusted Body Weight:    Vital Signs: Temp: 98 F (36.7 C) (01/22 1807) Temp Source: Oral (01/22 1807) BP: 156/81 (01/22 1807) Pulse Rate: 67 (01/22 1807) Intake/Output from previous day: No intake/output data recorded. Intake/Output from this shift: Total I/O In: 237 [Blood:237] Out: -   Labs:  Recent Labs  05/08/2016 0938  WBC 40.0*  HGB 7.3*  PLT 93*  CREATININE 2.64*   Estimated Creatinine Clearance: 24 mL/min (by C-G formula based on SCr of 2.64 mg/dL (H)). No results for input(s): VANCOTROUGH, VANCOPEAK, VANCORANDOM, GENTTROUGH, GENTPEAK, GENTRANDOM, TOBRATROUGH, TOBRAPEAK, TOBRARND, AMIKACINPEAK, AMIKACINTROU, AMIKACIN in the last 72 hours.   Microbiology: No results found for this or any previous visit (from the past 720 hour(s)).  Medical History: Past Medical History:  Diagnosis Date  . Abscess of skin 11/17/2014   Left ankle: incise & drain 11/17/14  . Cellulitis and abscess of leg 05/12/2015   rt leg  . Cellulitis of leg, right 07/12/2013  . Dyspnea   . Edema, peripheral 03/21/2014  . GERD (gastroesophageal reflux disease)   . Heart murmur 1936  . History of blood transfusion    last episode- 04/2015  . Hypothyroidism   . Inguinal hernia    reducible - on R side   . Ischemia of extremity 07/12/2013   Bilateral toes  2,3,4 right; 1,3 left  Vascular doppler normal for large vessel occlusion 2/15  . Lymphedema    R leg- in the past   . MDS (myelodysplastic syndrome) ([place])   . MDS (myelodysplastic syndrome), high grade ([place]) 06/13/2014    7% blasts BM bx 05/05/14  . MDS (myelodysplastic syndrome), low grade ([place]) 09/20/2011   Bone marrow bx 06/13/11: mild dyserythro/dymegakaryopoiesis.  Normal cytogenetics.  FISH negative for chrom 5 or 7 deletions.  Scattered small infiltrates of plasmacytoid lymphs  . MGUS (monoclonal gammopathy of unknown significance) 06/17/2011  . Monocytosis 03/21/2014  . Osteoarthritis    "back, legs, arms, shoulders" (07/12/2013)  . Pancytopenia, acquired ([place]) 06/17/2011   "from my Myelodysplastic syndrome"  . Prostate cancer ([place])   . Senile purpura ([place]) 10/18/2014   Assessment: 83 y/o M admitted from oncology office where he was supposed to receive a blood transfusion today with weakness and fatigue. He has myelodysplastic syndrome, HTN, hypothyroid, and chronic LE ulcers. Patient has had 1 week of increasing generalized weakness, fatigue, dyspnea, nonproductive cough, pallor, anorexia, nausea, and liquid stools, and hypoxia.   Vitals: Afebrile, BP 147/80, HR 84, WBC 40, CrCl estimated 24 (ARI)  ID: Sepsis. WBC 40, left shift. Start abx for hypoxic respiratory failure and acute leukocytosis.. Need to r/o Cdiff with recent abx.  Antimicrobials this admission:  Cefepime 1/22>>  Goal of Therapy:  Eradication of infection  Plan:  Cefepime 1g IV q24h   Joel Macdonald, PharmD, BCPS Clinical Staff Pharmacist Pager [phone]  Joel Macdonald 04/20/2016,6:12 PM

## 2016-04-29 NOTE — H&P (Signed)
Date: 04/25/2016               Patient Name:  Joel Macdonald MRN: [id number]  DOB: [birth year] Age / Sex: 83 y.o., male   PCP: Lajean Manes, MD         Medical Service: Internal Medicine Teaching Service         Attending Physician: Dr. Lucious Groves, DO    First Contact: Dr. Inda Castle Pager: [phone]  Second Contact: Dr. Juleen China Pager: [phone]       After Hours (After 5p/  First Contact Pager: [phone]  weekends / holidays): Second Contact Pager: [phone]   Chief Complaint: Weakness and fatigue  History of Present Illness: Joel Macdonald is a 83 y.o. male with history of MDS, HTN, and chronic lower extremity ulcers  who presents with 1 week of worsening fatigue, generalized weakness, and dyspnea.  For the past week, he has felt increasing generalized weakness, fatigue, dyspnea, nonproductive cough, pallor, anorexia, nausea, and liquid stools.  He has felt the worst he's felt in his life, and called his hematologist [name] for an urgent appointment because he thought he might need a blood transfusion.  He was seen in clinic today and referred to the ED with initial concern for PE vs infection when he was found to be hypoxic.  He has not noticed any fevers or chills.  Has had occasional self-limited epistaxis and scant bright red blood on the toilet paper after bowel movements which he attributes to hemorrhoids, but no melena or hematochezia.  He has had nausea and retching provoked by eating or drinking and consequently has not been eating or drinking much in several days.  He has a chronic wound of his right leg followed by Dr Sharol Given.  No changes including new exudate, odor, or pain.  He has had multiple courses of antibiotics for his leg wound, most recently Bactrim in December, and no antibiotics in the last month.  Meds:  No outpatient prescriptions have been marked as taking for the 04/28/2016 encounter [facility] Encounter) with [facility].     Allergies: Allergies  as of 04/24/2016 - Review Complete 05/06/2016  Allergen Reaction Noted  . Doxycycline Nausea And Vomiting 03/14/2016  . Erythromycin Nausea And Vomiting 03/14/2016  . Other Other (See Comments) 03/20/2012   Past Medical History:  Diagnosis Date  . Abscess of skin 11/17/2014   Left ankle: incise & drain 11/17/14  . Cellulitis and abscess of leg 05/12/2015   rt leg  . Cellulitis of leg, right 07/12/2013  . Dyspnea   . Edema, peripheral 03/21/2014  . GERD (gastroesophageal reflux disease)   . Heart murmur 1936  . History of blood transfusion    last episode- 04/2015  . Hypothyroidism   . Inguinal hernia    reducible - on R side   . Ischemia of extremity 07/12/2013   Bilateral toes  2,3,4 right; 1,3 left  Vascular doppler normal for large vessel occlusion 2/15  . Lymphedema    R leg- in the past   . MDS (myelodysplastic syndrome) ([place])   . MDS (myelodysplastic syndrome), high grade ([place]) 06/13/2014   7% blasts BM bx 05/05/14  . MDS (myelodysplastic syndrome), low grade ([place]) 09/20/2011   Bone marrow bx 06/13/11: mild dyserythro/dymegakaryopoiesis.  Normal cytogenetics.  FISH negative for chrom 5 or 7 deletions.  Scattered small infiltrates of plasmacytoid lymphs  . MGUS (monoclonal gammopathy of unknown significance) 06/17/2011  . Monocytosis 03/21/2014  . Osteoarthritis    "  back, legs, arms, shoulders" (07/12/2013)  . Pancytopenia, acquired ([place]) 06/17/2011   "from my Myelodysplastic syndrome"  . Prostate cancer ([place])   . Senile purpura ([place]) 10/18/2014   Family History:  Father with CAD No hematologic disorders  Social History: Retired pediatrician Drinks martini most evenings (none in past week) Former pipe smoker (quit ~20 years ago) No drugs  Review of Systems: A complete ROS was negative except as per HPI.  Physical Exam: Blood pressure (!) 147/80, pulse 84, temperature 98 F (36.7 C), temperature source Oral, resp. rate 18, height 6\' 1"  (1.854 m), weight 182 lb 15.7 oz (83  kg), SpO2 100 %.  Physical Exam  Constitutional: He is oriented to person, place, and time.  Appears stated age, mildly increased work of breathing, speaking in full sentences with occasional pauses to catch breath  HENT:  Head: Normocephalic and atraumatic.  Mouth/Throat: Oropharynx is clear and moist. No oropharyngeal exudate.  Eyes: Conjunctivae are normal. Pupils are equal, round, and reactive to light. No scleral icterus.  Neck: Normal range of motion. Neck supple.  Cardiovascular: Normal rate, regular rhythm and normal heart sounds.   Pulmonary/Chest: Effort normal and breath sounds normal. He has no wheezes. He has no rales.  Mildly increased work of breathing  Abdominal: Soft. He exhibits no distension. There is no tenderness.  Musculoskeletal: He exhibits no edema or tenderness.  Lymphadenopathy:    He has no cervical adenopathy.  Neurological: He is alert and oriented to person, place, and time.  Skin:  Large R lateral LE wound No exudate, odor, surrounding erythema, or tenderness Hyperpigmentation of bilateral LE consistent with chronic venous stasis  Psychiatric: He has a normal mood and affect. His behavior is normal.      CBC Latest Ref Rng & Units 05/05/2016 04/26/2016 04/15/2016  WBC 4.0 - 10.5 K/uL 40.0(H) 17.3(H) -  Hemoglobin 13.0 - 17.0 g/dL 7.3(L) 7.9(L) 8.5(L)  Hematocrit 39.0 - 52.0 % 22.3(L) 24.1(L) -  Platelets 150 - 400 K/uL 93(L) 88(L) -   Component     Latest Ref Rng & Units 04/20/2016          Neutrophils     % 27  Lymphocytes     % 28  Monocytes Relative     % 14  Eosinophil     % 4  Basophil     % 0  Band Neutrophils     % 19  Metamyelocytes Relative     % 4  Myelocytes     % 4  Promyelocytes Absolute     % 0  Blasts     % 0  nRBC     0 /100 WBC 0  Other     % 0  NEUT#     1.7 - 7.7 K/uL 21.6 (H)  Lymphocyte #     0.7 - 4.0 K/uL 11.2 (H)  Monocyte #     0.1 - 1.0 K/uL 5.6 (H)  Eosinophils Absolute     0.0 - 0.7 K/uL 1.6 (H)   Basophils Absolute     0.0 - 0.1 K/uL 0.0  WBC Morphology      INCREASED BANDS (>20% BANDS)  Smear Review      LARGE PLATELETS PRESENT   CMP Latest Ref Rng & Units 04/22/2016 03/20/2016 03/17/2016  Glucose 65 - 99 mg/dL 94 90 97  BUN 6 - 20 mg/dL 39(H) - 21(H)  Creatinine 0.61 - 1.24 mg/dL 2.64(H) - 1.22  Sodium 135 - 145 mmol/L 135  139 136  Potassium 3.5 - 5.1 mmol/L 3.4(L) 3.6 3.5  Chloride 101 - 111 mmol/L 97(L) - 104  CO2 22 - 32 mmol/L 20(L) - 27  Calcium 8.9 - 10.3 mg/dL 7.5(L) - 8.1(L)  Total Protein 6.5 - 8.1 g/dL 7.6 - -  Total Bilirubin 0.3 - 1.2 mg/dL 1.5(H) - -  Alkaline Phos 38 - 126 U/L 178(H) - -  AST 15 - 41 U/L 132(H) - -  ALT 17 - 63 U/L 28 - -   ABG    Component Value Date/Time   PHART 7.463 (H) 05/02/2016 1155   PCO2ART 28.8 (L) 04/26/2016 1155   PO2ART 54.6 (L) 04/20/2016 1155   HCO3 20.3 04/22/2016 1155   ACIDBASEDEF 2.9 (H) 04/19/2016 1155   O2SAT 84.9 04/09/2016 1155  on RA  Lactic Acid, Venous    Component Value Date/Time   LATICACIDVEN 1.1 04/18/2016 1507   Chest Radiograph AP 05/07/2016 IMPRESSION: Borderline cardiomegaly and minimal pulmonary vascular congestion.  VQ Scan 05/02/2016 IMPRESSION: Normal VQ scan.  Assessment & Plan by Problem: Principal Problem:   Respiratory failure with hypoxia (HCC) Active Problems:   HYPERTENSION, BENIGN SYSTEMIC   MDS (myelodysplastic syndrome), low grade (HCC)   Venous insufficiency (chronic) (peripheral)   Venous ulcer of right lower extremity with varicose veins (HCC)   Symptomatic anemia   Leukocytosis   AKI (acute kidney injury) ([place])   83 y.o. male with MDS who presented with 1 week of worsening fatigue, dyspnea, and cough and was found to be more anemic, hypoxic, and with high leukocytosis.  His presentation is concerning for a pulmonary infection, though his chest radiograph does not show any airspace disease.  PE was considered, but V/Q scan was normal.  Principal concern is for  infection vs leukemic transformation.  #Hypoxic Respiratory Failure History of nonproductive cough, worsening dyspnea, and hypoxemia with normal V/Q scan.  ABG with hypoxemia and respiratory alkalosis. His anemia cannot explain his low O2 saturation, suggesting an additional pulmonary process.  Infectious workup as below. -Supplemental O2 as needed -PA and lateral chest films -Procalcitonin  #Leukocytosis Left shift, no blasts on smear per laboratory differential.  Hypoxia, history of rhinorrhea and cough, but CXR without airspace disease.  Chronic wound does not appear infected.  No other localizing signs/symptoms, including urinary, CNS, bloodstream.  Reports some loose stools and has had recent antibiotics makes C Diff a possibility. -Hematology ([place]) consult -review smear -Flu PCR -blood cultures -RV panel -C Diff -Procalcitonin  #Anemia History of chronic, transfusion-dependent anemia, now below baseline.  Likely contributing in part to his symptoms. -Transfuse 2U pRBCs -review smear  #Acute Renal Insufficiency Cr 2.64, baseline ~1, BUN/Cr 15, history of poor oral intake.  May represent prerenal process. -Check FeNA -IVF -Follow renal function -Consider renal US  #Hypokalemia Mild, K 3.4. -40 mEq PO KCl once -Check Mg -Follow electrolytes  #Hypothyroidism -Continue home synthroid  #Chronic Leg Wound -WOC consult  Fluids: NS 100 mL/hr Diet: regular DVT Prophylaxis: lovenox Code Status: full  Dispo: Admit patient to Inpatient with expected length of stay greater than 2 midnights.  Signed: [name], MD 04/26/2016, 3:56 PM  Pager: [phone]

## 2016-04-29 NOTE — Progress Notes (Signed)
Hematology and Oncology Follow Up Visit  Joel Macdonald [id number] Apr 26, 1930 83 y.o. 04/13/2016 1:13 PM   Principle Diagnosis: Encounter Diagnoses  Name Primary?  . Pancytopenia, acquired ([place])   . MDS (myelodysplastic syndrome), low grade ([place])   . MDS (myelodysplastic syndrome), high grade ([place])   . MGUS (monoclonal gammopathy of unknown significance)   . Symptomatic anemia   . 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 In addition, he has prostate cancer in remission. He initially presented with pancytopenia in February 2013. No excess blasts on that bone marrow and he was followed with observation alone until he developed progressive, transfusion dependent, anemia and monocytosis. Bone marrow aspiration and biopsy repeated on 05/05/2014 now showed excess blasts of 7%. Normal cytogenetics. In view of his age and comorbid conditions, I elected not to put him on a chemotherapy program. He was started on Aranesp subcutaneous every 3 weeks to support his red cells and this has been successful in keeping his hemoglobin in the 9-10 gram range and sparing him from blood transfusions until recently when his baseline hemoglobin has dropped to 8.5. He has developed a number of other problems summarized in my office note of 02/20/2015. He developed refractory unexplained bilateral peripheral edema with a negative cardiac and renal evaluation. No evidence for blood clots. Secondary to the persistent edema, he developed recurrent episodes of bilateral cellulitis requiring multiple courses of antibiotics. At time of a 11/18/2014 visit, he had developed a superficial abscess on the skin of his left leg which was incised and drained in the office. He Presented to the hospital on 05/12/2015 with a nonhealing ulcer with purulent discharge anterolateral aspect of his right  ankle which began to develop just 2 days before Christmas. Has had a number of courses of oral antibiotics with doxycycline and Keflex. He was referred back to the wound center. He had another incision and drainage procedure and packing was left in the wound. Since there was no significant improvement, a CT scan of the right ankle was done on February 1 which showed a superficial collection of gas and fluid in the subcutaneous fat suspicious for residual abscess but no involvement of the underlying muscle or bone. He underwent extensive debridement and skin grafting by Dr. Beverely Low due to. Initial antibiotic therapy with vancomycin and Zosyn. A PICC catheter placed for additional home antibiotics to complete a 14 day course.   Interim History:   Unscheduled visit today at the patient's request. He called me Saturday morning. He he reported increasing dyspnea and profound fatigue. He had just had lab work done on Friday through the orthopedic surgery office but did not yet have the results back. He felt like he was going to need a blood transfusion. I advised him to go the emergency department to get lab. I called later in the day to check on him. In the interim he had heard from the orthopedic surgeon's office and his hemoglobin was 7.9. This was not much different from his recent baseline. I told him I would still like to give him a blood transfusion. I would make arrangements for this today. He called this morning and asked if I would see him. He really felt that he was much more dyspneic and could barely walk a few steps. He has no prior cardiac history. No MI. No arrhythmias. He is a never smoker. No known lung disease. He continues to  have ongoing problems with nonhealing ulcers of his right lower extremity. Significant chronic edema of the right lower extremity. He denied any chest pain, chest pressure, or palpitations. He has had an intermittent, mild cough. No fever. No sputum production.  He reports  nausea and inability to even keep down fluids the last 48 hours.   Medications: reviewed  Allergies:  Allergies  Allergen Reactions  . Doxycycline Nausea And Vomiting    Upset stomach   . Erythromycin Nausea And Vomiting    Minor upset stomach - pt tolerates med ok   . Other Other (See Comments)    R[facility] >> NASAL CONGESTION    Review of Systems: See interim history Remaining ROS negative:   Physical Exam: Blood pressure (!) 145/72, pulse 98, temperature 98.3 F (36.8 C), temperature source Oral, height 6\' 1"  (1.854 m), weight 193 lb 3.2 oz (87.6 kg), SpO2 92 %. Respiratory rate 24-28 Wt Readings from Last 3 Encounters:  05/07/2016 183 lb (83 kg)  04/19/16 191 lb (86.6 kg)  04/09/16 191 lb (86.6 kg)  Oxygen saturation 92% on room air   General appearance: Pale, Elderly Caucasian man in moderate respiratory distress at rest HENNT: Pharynx no erythema, exudate, mass, or ulcer. No thyromegaly or thyroid nodules Lymph nodes: No cervical, supraclavicular, or axillary lymphadenopathy Breasts:  Lungs: Minimal rales at the lung bases, resonant to percussion throughout Heart: Regular rhythm, no murmur, no gallop, no rub, no click,no JVD Abdomen: Soft, nontender, normal bowel sounds, no mass, no organomegaly Extremities: Extensive chronic venous stasis changes. Asymmetric edema, right ankle 2+. no calf tenderness Slowly healing chronic ulcer right lower extremity. I did not remove the dressing at this time Musculoskeletal: no joint deformities GU:  Vascular: Carotid pulses 2+, no bruits, distal pulses: Dorsalis pedis 1+ symmetric Neurologic: Alert, oriented, PERRLA, optic discs sharp and vessels normal, no hemorrhage or exudate, cranial nerves grossly normal, motor strength 5 over 5, reflexes 1+ symmetric, upper body coordination normal, gait normal, Skin: Multiple ecchymosis on the skin of his forearms.  Lab Results: CBC W/Diff white count differential with 27 neutrophils, 19 %  band forms, 4 myelocytes, 4 metamyelocytes, 0 blasts,    Component Value Date/Time   WBC 40.0 (H) 04/30/2016 0938   RBC 2.33 (L) 05/04/2016 0938   HGB 7.3 (L) 04/28/2016 0938   HGB 10.4 (L) 05/17/2013 1258   HCT 22.3 (L) 04/24/2016 0938   HCT 26.7 (L) 12/29/2015 1411   HCT 31.5 (L) 05/17/2013 1258   PLT 93 (L) 04/25/2016 0938   PLT 161 12/29/2015 1411   MCV 95.7 05/06/2016 0938   MCV 101 (H) 12/29/2015 1411   MCV 104.7 (H) 05/17/2013 1258   MCH 31.3 04/30/2016 0938   MCHC 32.7 05/02/2016 0938   RDW 21.6 (H) 04/17/2016 0938   RDW 16.5 (H) 12/29/2015 1411   RDW 16.0 (H) 05/17/2013 1258   LYMPHSABS 11.2 (H) 04/26/2016 0938   LYMPHSABS 1.2 12/29/2015 1411   LYMPHSABS 1.5 05/17/2013 1258   MONOABS 5.6 (H) 04/20/2016 0938   MONOABS 0.8 05/17/2013 1258   EOSABS 1.6 (H) 04/17/2016 0938   EOSABS 0.0 12/29/2015 1411   BASOSABS 0.0 05/04/2016 0938   BASOSABS 0.0 12/29/2015 1411   BASOSABS 0.0 05/17/2013 1258     Chemistry      Component Value Date/Time   NA 135 05/06/2016 0938   NA 137 08/14/2015 1117   NA 139 11/09/2012 1425   K 3.4 (L) 04/08/2016 0938   K 4.2 11/09/2012 1425  CL 97 (L) 04/13/2016 0938   CO2 20 (L) 04/12/2016 0938   CO2 24 11/09/2012 1425   BUN 39 (H) 04/27/2016 0938   BUN 21 08/14/2015 1117   BUN 22.4 11/09/2012 1425   CREATININE 2.64 (H) 04/28/2016 0938   CREATININE 0.88 10/17/2014 1101   CREATININE 1.2 11/09/2012 1425      Component Value Date/Time   CALCIUM 7.5 (L) 04/30/2016 0938   CALCIUM 9.3 11/09/2012 1425   ALKPHOS 178 (H) 04/14/2016 0938   ALKPHOS 62 11/09/2012 1425   AST 132 (H) 05/01/2016 0938   AST 20 11/09/2012 1425   ALT 28 04/18/2016 0938   ALT 19 11/09/2012 1425   BILITOT 1.5 (H) 04/25/2016 0938   BILITOT 0.4 08/14/2015 1117   BILITOT 0.57 11/09/2012 1425     Arterial blood gas on room air: PH 7.46, PO2 55, PCO2 29.   Radiological Studies: Dg Chest Port 1 View  Result Date: 04/11/2016 CLINICAL DATA:  Acute dyspnea. EXAM:  PORTABLE CHEST 1 VIEW COMPARISON:  08/01/2006. FINDINGS: Interval borderline enlarged cardiac silhouette and minimally prominent pulmonary vasculature. Clear lungs. Mildly elevated left hemidiaphragm. Mild lower thoracic and bilateral shoulder degenerative changes. IMPRESSION: Borderline cardiomegaly and minimal pulmonary vascular congestion. Electronically Signed   By: Claudie Revering M.D.   On: 04/22/2016 11:59    Impression:  #1. Dyspnea and hypoxia disproportionate to his chronic anemia symptoms. He is at high risk for pulmonary embolism. Chronic stasis of his lower extremities. Multiple surgical manipulations on nonhealing wounds. I will proceed with a ventilation/perfusion lung scan. 1 dose of Lovenox 1 mg/kg subcutaneous given in the short stay unit. If diagnosis confirmed, continue Lovenox despite borderline decrease in his platelet count.  #2. High risk myelodysplastic syndrome Abrupt change in the pattern of his labs with acute leukocytosis and subacute fall in his platelet count. No blasts noted on his peripheral blood film by the lab technician but I will need to review. Although the acute leukocytosis may be a stress reaction to hypoxia other concerns would be infection and or transformation to acute leukemia.  #3. Acute leukocytosis with increased band forms See #2 above. I would have a low threshold for culture and beginning empiric antibiotics pending further study.  #4. Acute on chronic anemia Likely combination of progression of his MDS, possible underlying infection. He has been typed and crossed for 2 units of blood and I would go ahead and transfuse him.  #5. Chronic nonhealing ulcers lower extremities. Related to neutrophil dysfunction from his MDS.  He is acutely ill and hospitalization indicated. I have discussed with the attending on the inpatient medical service Dr. Heber [place].    CC: Patient Care Team: Lajean Manes, MD as PCP - General (Internal Medicine) Annia Belt, MD as Consulting Physician (Oncology)   Annia Belt, MD 1/22/20181:13 PM

## 2016-04-30 ENCOUNTER — Inpatient Hospital Stay (HOSPITAL_COMMUNITY): Admission: RE | Admit: 2016-04-30 | Payer: Medicare Other | Source: Ambulatory Visit

## 2016-04-30 ENCOUNTER — Inpatient Hospital Stay (HOSPITAL_COMMUNITY): Payer: Medicare Other

## 2016-04-30 DIAGNOSIS — L97909 Non-pressure chronic ulcer of unspecified part of unspecified lower leg with unspecified severity: Secondary | ICD-10-CM

## 2016-04-30 DIAGNOSIS — Z9889 Other specified postprocedural states: Secondary | ICD-10-CM

## 2016-04-30 DIAGNOSIS — I83009 Varicose veins of unspecified lower extremity with ulcer of unspecified site: Secondary | ICD-10-CM

## 2016-04-30 DIAGNOSIS — I10 Essential (primary) hypertension: Secondary | ICD-10-CM

## 2016-04-30 DIAGNOSIS — E039 Hypothyroidism, unspecified: Secondary | ICD-10-CM

## 2016-04-30 DIAGNOSIS — D46Z Other myelodysplastic syndromes: Secondary | ICD-10-CM

## 2016-04-30 DIAGNOSIS — D72829 Elevated white blood cell count, unspecified: Secondary | ICD-10-CM

## 2016-04-30 DIAGNOSIS — L97919 Non-pressure chronic ulcer of unspecified part of right lower leg with unspecified severity: Secondary | ICD-10-CM

## 2016-04-30 DIAGNOSIS — E876 Hypokalemia: Secondary | ICD-10-CM

## 2016-04-30 DIAGNOSIS — Z881 Allergy status to other antibiotic agents status: Secondary | ICD-10-CM

## 2016-04-30 DIAGNOSIS — D62 Acute posthemorrhagic anemia: Secondary | ICD-10-CM

## 2016-04-30 DIAGNOSIS — I83012 Varicose veins of right lower extremity with ulcer of calf: Secondary | ICD-10-CM

## 2016-04-30 DIAGNOSIS — E790 Hyperuricemia without signs of inflammatory arthritis and tophaceous disease: Secondary | ICD-10-CM

## 2016-04-30 DIAGNOSIS — Z8249 Family history of ischemic heart disease and other diseases of the circulatory system: Secondary | ICD-10-CM

## 2016-04-30 DIAGNOSIS — R0600 Dyspnea, unspecified: Secondary | ICD-10-CM

## 2016-04-30 DIAGNOSIS — Z87891 Personal history of nicotine dependence: Secondary | ICD-10-CM

## 2016-04-30 DIAGNOSIS — R0902 Hypoxemia: Secondary | ICD-10-CM

## 2016-04-30 DIAGNOSIS — D696 Thrombocytopenia, unspecified: Secondary | ICD-10-CM

## 2016-04-30 DIAGNOSIS — R041 Hemorrhage from throat: Secondary | ICD-10-CM

## 2016-04-30 DIAGNOSIS — Z66 Do not resuscitate: Secondary | ICD-10-CM

## 2016-04-30 DIAGNOSIS — E883 Tumor lysis syndrome: Secondary | ICD-10-CM

## 2016-04-30 LAB — COMPREHENSIVE METABOLIC PANEL
ALBUMIN: 2.9 g/dL — AB (ref 3.5–5.0)
ALT: 25 U/L (ref 17–63)
AST: 118 U/L — AB (ref 15–41)
Alkaline Phosphatase: 149 U/L — ABNORMAL HIGH (ref 38–126)
Anion gap: 14 (ref 5–15)
BUN: 49 mg/dL — AB (ref 6–20)
CHLORIDE: 102 mmol/L (ref 101–111)
CO2: 17 mmol/L — AB (ref 22–32)
Calcium: 6.9 mg/dL — ABNORMAL LOW (ref 8.9–10.3)
Creatinine, Ser: 2.92 mg/dL — ABNORMAL HIGH (ref 0.61–1.24)
GFR calc Af Amer: 21 mL/min — ABNORMAL LOW (ref >60)
GFR calc non Af Amer: 18 mL/min — ABNORMAL LOW (ref >60)
GLUCOSE: 91 mg/dL (ref 65–99)
Potassium: 3.6 mmol/L (ref 3.5–5.1)
Sodium: 133 mmol/L — ABNORMAL LOW (ref 135–145)
Total Bilirubin: 1.4 mg/dL — ABNORMAL HIGH (ref 0.3–1.2)
Total Protein: 6.8 g/dL (ref 6.5–8.1)

## 2016-04-30 LAB — RESPIRATORY PANEL BY PCR
Adenovirus: NOT DETECTED
BORDETELLA PERTUSSIS-RVPCR: NOT DETECTED
CHLAMYDOPHILA PNEUMONIAE-RVPPCR: NOT DETECTED
CORONAVIRUS 229E-RVPPCR: NOT DETECTED
Coronavirus HKU1: NOT DETECTED
Coronavirus NL63: NOT DETECTED
Coronavirus OC43: NOT DETECTED
INFLUENZA B-RVPPCR: NOT DETECTED
Influenza A: NOT DETECTED
METAPNEUMOVIRUS-RVPPCR: NOT DETECTED
Mycoplasma pneumoniae: NOT DETECTED
PARAINFLUENZA VIRUS 2-RVPPCR: NOT DETECTED
PARAINFLUENZA VIRUS 3-RVPPCR: NOT DETECTED
Parainfluenza Virus 1: NOT DETECTED
Parainfluenza Virus 4: NOT DETECTED
RESPIRATORY SYNCYTIAL VIRUS-RVPPCR: NOT DETECTED
RHINOVIRUS / ENTEROVIRUS - RVPPCR: NOT DETECTED

## 2016-04-30 LAB — CBC WITH DIFFERENTIAL/PLATELET
BLASTS: 0 %
Band Neutrophils: 0 %
Basophils Absolute: 0 10*3/uL (ref 0.0–0.1)
Basophils Relative: 0 %
Eosinophils Absolute: 0 10*3/uL (ref 0.0–0.7)
Eosinophils Relative: 0 %
HCT: 24.4 % — ABNORMAL LOW (ref 39.0–52.0)
HEMOGLOBIN: 8.3 g/dL — AB (ref 13.0–17.0)
Lymphocytes Relative: 32 %
Lymphs Abs: 11 10*3/uL — ABNORMAL HIGH (ref 0.7–4.0)
MCH: 31.1 pg (ref 26.0–34.0)
MCHC: 34 g/dL (ref 30.0–36.0)
MCV: 91.4 fL (ref 78.0–100.0)
METAMYELOCYTES PCT: 0 %
MYELOCYTES: 0 %
Monocytes Absolute: 8.6 10*3/uL — ABNORMAL HIGH (ref 0.1–1.0)
Monocytes Relative: 25 %
NEUTROS PCT: 43 %
NRBC: 0 /100{WBCs}
Neutro Abs: 14.7 10*3/uL — ABNORMAL HIGH (ref 1.7–7.7)
Platelets: 78 10*3/uL — ABNORMAL LOW (ref 150–400)
Promyelocytes Absolute: 0 %
RBC: 2.67 MIL/uL — AB (ref 4.22–5.81)
RDW: 21.9 % — ABNORMAL HIGH (ref 11.5–15.5)
WBC: 34.3 10*3/uL — AB (ref 4.0–10.5)

## 2016-04-30 LAB — URIC ACID: Uric Acid, Serum: >21 mg/dL — ABNORMAL HIGH (ref 4.4–7.6)

## 2016-04-30 LAB — TYPE AND SCREEN
Blood Product Expiration Date: 201801252359
Blood Product Expiration Date: 201801262359
ISSUE DATE / TIME: 201801221818
ISSUE DATE / TIME: 201801221526
UNIT TYPE AND RH: 9500
Unit Type and Rh: 9500

## 2016-04-30 LAB — CBC
HEMATOCRIT: 24.5 % — AB (ref 39.0–52.0)
Hemoglobin: 8.2 g/dL — ABNORMAL LOW (ref 13.0–17.0)
MCH: 30.8 pg (ref 26.0–34.0)
MCHC: 33.5 g/dL (ref 30.0–36.0)
MCV: 92.1 fL (ref 78.0–100.0)
Platelets: 70 10*3/uL — ABNORMAL LOW (ref 150–400)
RBC: 2.66 MIL/uL — ABNORMAL LOW (ref 4.22–5.81)
RDW: 22.8 % — AB (ref 11.5–15.5)
WBC: 34.9 10*3/uL — ABNORMAL HIGH (ref 4.0–10.5)

## 2016-04-30 LAB — PHOSPHORUS: Phosphorus: 5.5 mg/dL — ABNORMAL HIGH (ref 2.5–4.6)

## 2016-04-30 LAB — LACTATE DEHYDROGENASE: LDH: 5286 U/L — AB (ref 98–192)

## 2016-04-30 LAB — PROTIME-INR
INR: 1.58
Prothrombin Time: 19 seconds — ABNORMAL HIGH (ref 11.4–15.2)

## 2016-04-30 LAB — APTT: aPTT: 56 seconds — ABNORMAL HIGH (ref 24–36)

## 2016-04-30 LAB — PROCALCITONIN: Procalcitonin: 1.53 ng/mL

## 2016-04-30 LAB — MAGNESIUM: Magnesium: 1.3 mg/dL — ABNORMAL LOW (ref 1.7–2.4)

## 2016-04-30 MED ORDER — MAGNESIUM SULFATE 2 GM/50ML IV SOLN
2.0000 g | Freq: Once | INTRAVENOUS | Status: AC
Start: 2016-04-30 — End: 2016-04-30
  Administered 2016-04-30: 2 g via INTRAVENOUS
  Filled 2016-04-30: qty 50

## 2016-04-30 MED ORDER — ALLOPURINOL 300 MG PO TABS
150.0000 mg | ORAL_TABLET | Freq: Three times a day (TID) | ORAL | Status: DC
  Administered 2016-04-30 – 2016-05-01 (×4): 150 mg via ORAL
  Filled 2016-04-30 (×4): qty 1

## 2016-04-30 MED ORDER — DEXTROSE 5 % IV SOLN
1.0000 g | Freq: Every day | INTRAVENOUS | Status: DC
  Administered 2016-04-30 – 2016-05-01 (×2): 1 g via INTRAVENOUS
  Filled 2016-04-30 (×3): qty 1

## 2016-04-30 MED ORDER — SILVER SULFADIAZINE 1 % EX CREA
TOPICAL_CREAM | Freq: Every day | CUTANEOUS | Status: DC
  Administered 2016-04-30 – 2016-05-01 (×2): via TOPICAL
  Filled 2016-04-30: qty 85

## 2016-04-30 MED ORDER — LIDOCAINE HCL (PF) 2 % IJ SOLN
0.0000 mL | Freq: Once | INTRAMUSCULAR | Status: DC | PRN
  Filled 2016-04-30: qty 20

## 2016-04-30 MED ORDER — SODIUM CHLORIDE 0.9 % IV SOLN
6.0000 mg | Freq: Once | INTRAVENOUS | Status: AC
  Administered 2016-04-30: 6 mg via INTRAVENOUS
  Filled 2016-04-30: qty 4

## 2016-04-30 MED ORDER — WHITE PETROLATUM GEL
Status: AC
  Administered 2016-04-30: 13:00:00
  Filled 2016-04-30: qty 1

## 2016-04-30 MED ORDER — ACETAMINOPHEN 325 MG PO TABS
650.0000 mg | ORAL_TABLET | Freq: Four times a day (QID) | ORAL | Status: DC | PRN
  Administered 2016-04-30 – 2016-05-01 (×2): 650 mg via ORAL
  Filled 2016-04-30 (×2): qty 2

## 2016-04-30 NOTE — Progress Notes (Signed)
Bone Marrow Biopsy and Aspiration Procedure Note   Informed consent was obtained and potential risks including bleeding, infection and pain were reviewed with the patient.  Red Rule procedure followed  Right Posterior iliac crest(s) prepped with Betadine.  Lidocaine 1% local anesthesia infiltrated into the subcutaneous tissue.  Premedication: none  Right iliac crest bone marrow biopsy and  aspirate was obtained.   The procedure was tolerated well and there were no complications.  Specimens sent for routine histopathologic stains and sectioning, flow cytometry and cytogenetics  Indication: High grade myelodysplastic syndrome now with acute leukocytosis, fall in Hemoglobin and platelets rule out conversion to acute leukemia  Physician: Murriel Hopper, MD, [place]  Hematology-Oncology/Internal Medicine

## 2016-04-30 NOTE — Progress Notes (Signed)
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 Pt. Also being transferred to higher level of care per MD order.

## 2016-04-30 NOTE — Consult Note (Addendum)
WOC consult requested for right ankle.  Dr Sharol Given of the ortho service has already performed a consult and provided topical treatment orders for the staff nurses to perform with Silvadene.  Please refer to their team for further questions. Please re-consult if further assistance is needed.  Thank-you,  Julien Girt MSN, [place], [place], [place], [place]

## 2016-04-30 NOTE — Progress Notes (Signed)
Pharmacy Antibiotic Note  Joel Macdonald is a 83 y.o. male admitted on 05/04/2016 with RLE wound.  Pharmacy has been consulted for Cefepime dosing.  Plan: Cefepime 1 g IV q24h  Height: 6\' 1"  (185.4 cm) Weight: 182 lb 15.7 oz (83 kg) IBW/kg (Calculated) : 79.9  Temp (24hrs), Avg:98 F (36.7 C), Min:97.4 F (36.3 C), Max:98.5 F (36.9 C)   Recent Labs Lab 04/26/16 0833 04/28/2016 0938 04/13/2016 1507 04/30/16 0058  WBC 17.3* 40.0*  --  34.3*  CREATININE  --  2.64*  --   --   LATICACIDVEN  --   --  1.1  --     Estimated Creatinine Clearance: 24 mL/min (by C-G formula based on SCr of 2.64 mg/dL (H)).    Allergies  Allergen Reactions  . Sulfa Antibiotics Other (See Comments)    Per patient, this "was not tolerated very well"  . Doxycycline Nausea And Vomiting    Upset stomach   . Erythromycin Nausea And Vomiting    Minor upset stomach - pt tolerates med ok   . Other Other (See Comments)    [facility] >> NASAL CONGESTION    Caryl Pina 04/30/2016 8:02 AM

## 2016-04-30 NOTE — Consult Note (Signed)
ORTHOPAEDIC CONSULTATION  REQUESTING PHYSICIAN: Lucious Groves, DO  Chief Complaint: Shortness of breath with chronic venous stasis ulcer right leg status post skin graft  HPI: Joel Macdonald is a 83 y.o. male who presents with acute shortness of breath. Patient's shortness of breath is much worse than would be expected with his current hemoglobin level. VQ scan was negative for pulmonary embolus.  Patient is undergone multiple skin grafts for the venous ulcer. Most recent cultures were positive for pseudomonas and patient was treated with Bactrim DS. Patient has had allergic reaction to doxycycline in the past.  Patient has extremely elevated uric acid level. No joint gout symptoms.  Past Medical History:  Diagnosis Date  . Abscess of skin 11/17/2014   Left ankle: incise & drain 11/17/14  . Cellulitis and abscess of leg 05/12/2015   rt leg  . Cellulitis of leg, right 07/12/2013  . Dyspnea   . Edema, peripheral 03/21/2014  . GERD (gastroesophageal reflux disease)   . Heart murmur 1936  . History of blood transfusion    last episode- 04/2015  . Hypothyroidism   . Inguinal hernia    reducible - on R side   . Ischemia of extremity 07/12/2013   Bilateral toes  2,3,4 right; 1,3 left  Vascular doppler normal for large vessel occlusion 2/15  . Lymphedema    R leg- in the past   . MDS (myelodysplastic syndrome) ([place])   . MDS (myelodysplastic syndrome), high grade ([place]) 06/13/2014   7% blasts BM bx 05/05/14  . MDS (myelodysplastic syndrome), low grade ([place]) 09/20/2011   Bone marrow bx 06/13/11: mild dyserythro/dymegakaryopoiesis.  Normal cytogenetics.  FISH negative for chrom 5 or 7 deletions.  Scattered small infiltrates of plasmacytoid lymphs  . MGUS (monoclonal gammopathy of unknown significance) 06/17/2011  . Monocytosis 03/21/2014  . Osteoarthritis    "back, legs, arms, shoulders" (07/12/2013)  . Pancytopenia, acquired ([place]) 06/17/2011   "from my Myelodysplastic syndrome"  . Prostate  cancer ([place])   . Senile purpura ([place]) 10/18/2014   Past Surgical History:  Procedure Laterality Date  . APPLICATION OF WOUND VAC Right 05/17/2015   Procedure: VAC placement;  Surgeon: Newt Minion, MD;  Location: [facility];  Service: Orthopedics;  Laterality: Right;  . CATARACT EXTRACTION W/ INTRAOCULAR LENS  IMPLANT, BILATERAL Bilateral ~ 2012  . I&D EXTREMITY Right 05/13/2015   Procedure: IRRIGATION AND DEBRIDEMENT ANKLE;  Surgeon: Newt Minion, MD;  Location: [facility];  Service: Orthopedics;  Laterality: Right;  . I&D EXTREMITY Right 05/17/2015   Procedure: IRRIGATION AND DEBRIDEMENT RIGHT LEG, APPLY SKIN GRAFT ;  Surgeon: Newt Minion, MD;  Location: [facility];  Service: Orthopedics;  Laterality: Right;  . I&D EXTREMITY Right 03/16/2016   Procedure: Excise Wound Right Leg, Apply Eunice Blase;  Surgeon: Newt Minion, MD;  Location: [facility];  Service: Orthopedics;  Laterality: Right;  . ROBOT ASSISTED LAPAROSCOPIC RADICAL PROSTATECTOMY  ~ 2010  . SKIN SPLIT GRAFT Right 12/06/2015   Procedure: APPLY SKIN GRAFT SPLIT THICKNESS TO RIGHT LEG;  Surgeon: Newt Minion, MD;  Location: [facility];  Service: Orthopedics;  Laterality: Right;  . SKIN SPLIT GRAFT Right 03/20/2016   Procedure: SKIN GRAFT SPLIT THICKNESS RIGHT LEG, APPLY THERASKIN AND WOUND VAC;  Surgeon: Newt Minion, MD;  Location: [facility];  Service: Orthopedics;  Laterality: Right;  . TONSILLECTOMY AND ADENOIDECTOMY  ~ 57   Social History   Social History  . Marital status: Married    Spouse  name: N/A  . Number of children: N/A  . Years of education: N/A   Social History Main Topics  . Smoking status: Former Smoker    Years: 40.00    Types: Pipe    Quit date: 07/12/1993  . Smokeless tobacco: Never Used  . Alcohol use 4.2 oz/week    7 Shots of liquor per week     Comment: occasionally.- q evening- martini   . Drug use: No  . Sexual activity: Not Asked   Other Topics Concern  . None   Social History Narrative  . None   Family History    Problem Relation Age of Onset  . Other Mother   . Other Father   . Heart disease Father   . Hyperlipidemia Son    - negative except otherwise stated in the family history section Allergies  Allergen Reactions  . Sulfa Antibiotics Other (See Comments)    Per patient, this "was not tolerated very well"  . Doxycycline Nausea And Vomiting    Upset stomach   . Erythromycin Nausea And Vomiting    Minor upset stomach - pt tolerates med ok   . Other Other (See Comments)    [facility] >> NASAL CONGESTION   Prior to Admission medications   Medication Sig Start Date End Date Taking? Authorizing Provider  acetaminophen (TYLENOL) 500 MG tablet Take 500-1,000 mg by mouth every 6 (six) hours as needed for moderate pain or headache.    Yes Historical Provider, MD  aspirin 325 MG tablet Take 325-650 mg by mouth daily as needed for moderate pain.    Yes Historical Provider, MD  Aspirin Effervescent (ALKA-SELTZER EXTRA STRENGTH) 500 MG TBEF Take 1,000 mg by mouth every 4 (four) hours as needed (pain/ indigestion/ heartburn).   Yes Historical Provider, MD  fluticasone (FLONASE) 50 MCG/ACT nasal spray Place 1 spray into both nostrils daily as needed (seasonal allergies).   Yes Historical Provider, MD  ibuprofen (ADVIL,MOTRIN) 200 MG tablet Take 200-400 mg by mouth every 6 (six) hours as needed for mild pain.    Yes Historical Provider, MD  levothyroxine (SYNTHROID) 50 MCG tablet Take 98mcg every morning on an empty stomach 05/20/14  Yes Historical Provider, MD  Multiple Vitamins-Minerals (CENTRUM SILVER 50+MEN) TABS Take 1 tablet by mouth daily.   Yes Historical Provider, MD  ranitidine (ZANTAC) 150 MG tablet Take 150 mg by mouth daily as needed for heartburn.   Yes Historical Provider, MD  oxyCODONE-acetaminophen (PERCOCET/ROXICET) 5-325 MG tablet Take 1 tablet by mouth every 4 (four) hours as needed for severe pain. Patient not taking: Reported on 05/08/2016 03/20/16   Newt Minion, MD   sulfamethoxazole-trimethoprim (BACTRIM DS) 800-160 MG tablet Take 1 tablet by mouth 2 (two) times daily. Patient not taking: Reported on 04/22/2016 03/20/16   Newt Minion, MD   Nm Pulmonary Perf And Vent  Result Date: 04/19/2016 CLINICAL DATA:  Worsening shortness of breath over the last 5 days. EXAM: NUCLEAR MEDICINE VENTILATION - PERFUSION LUNG SCAN TECHNIQUE: Ventilation images were obtained in multiple projections using inhaled aerosol Tc-101m DTPA. Perfusion images were obtained in multiple projections after intravenous injection of Tc-75m MAA. RADIOPHARMACEUTICALS:  30.1 mCi Technetium-19m DTPA aerosol inhalation and 4.1 mCi Technetium-75m MAA IV COMPARISON:  Radiography same day FINDINGS: Ventilation: Ventilation within normal limits. Perfusion: Perfusion within normal limits. IMPRESSION: Normal VQ scan. Electronically Signed   By: Nelson Chimes M.D.   On: 04/18/2016 14:19   Dg Chest Port 1 View  Result Date: 04/27/2016 CLINICAL  DATA:  Acute dyspnea. EXAM: PORTABLE CHEST 1 VIEW COMPARISON:  08/01/2006. FINDINGS: Interval borderline enlarged cardiac silhouette and minimally prominent pulmonary vasculature. Clear lungs. Mildly elevated left hemidiaphragm. Mild lower thoracic and bilateral shoulder degenerative changes. IMPRESSION: Borderline cardiomegaly and minimal pulmonary vascular congestion. Electronically Signed   By: Claudie Revering M.D.   On: 04/14/2016 11:59   - pertinent xrays, CT, MRI studies were reviewed and independently interpreted  Positive ROS: All other systems have been reviewed and were otherwise negative with the exception of those mentioned in the HPI and as above.  Physical Exam: General: Alert, short of breath wall leading down with nasal cannula FiO2 Psychiatric: Patient is competent for consent with normal mood and affect Lymphatic: No axillary or cervical lymphadenopathy Cardiovascular: Peripheral edema with venous stasis insufficiency right lower  extremity Respiratory: Short of breath with use of accessory muscles with nasal cannula and while laying. GI: No organomegaly, abdomen is soft and non-tender  Skin: Examination patient has brawny skin color changes in the right leg there is no cellulitis but patient does have drainage from the wound. Patient has developed a thin eschar over the skin graft right leg.   Neurologic: Patient does have protective sensation bilateral lower extremities.   MUSCULOSKELETAL:  Very small amount of redness around the skin graft with thin eschar. No ascending cellulitis pitting edema with venous insufficiency.  Assessment: From an orthopedic standpoint:  venous ulcer right leg status post skin graft with history of Pseudomonas infection of the right leg wound with extremely elevated uric acid level with a small amount of drainage.  Plan: Silvadene dressing plus a  compressive wrap right lower extremity to be changed daily. Elevation right lower extremity above his heart. Weightbearing as tolerated on the right.  With the increased drainage of the right leg I would think patient would benefit from antibiotics that would cover pseudomonas and with patient's underlying medical condition I'm not sure if he would benefit from colchicine combined with allopurinol or single treatment with uloric.  Thank you for the consult and the opportunity to see Mr. Broedy Mogg, [place] [phone] 6:45 AM

## 2016-04-30 NOTE — H&P (Signed)
Internal Medicine Attending Admission Note  I saw and evaluated the patient. I reviewed the resident's note and I agree with the resident's findings and plan as documented in the resident's note.  Assessment & Plan by Problem:  Principal Problem:   Acute respiratory failure with hypoxia (HCC) - ABG revealed pO2 of 55, unexplained by patient's anemia and CXR.  Currently has AKI due to TLS.  We are covering for CAP with Cefepime, no risk factors for MRSA.  Pulmonary leukostasis is a concern but less likely given current WBC count.  Pulmonary edema a concern with potential poor UOP, will colosely monitor.  Will also obtain non contrast CT of chest to evaluate. -Transfer to step down for closer monitoring.     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  He reports symptoms began about a week ago.  Associated with generalized weakness, nonproductive cough, decreased appetite, nausea.  Symptoms were similar to previous episodes of anemia requiring transfusion therefore he called for an acute visit with [name].   In clinic [name] was concerned as he appeared ill, was found to be hypoxic.  He obtained STAT labs as well as an ABG which revealed mildly lower Hgb compared with his baseline, abrupt change of WBC from chronic leukopenia to leukocytosis of 40k. And ABG revealed hypoxia and mild respiratory alkalosis.  A CXR revealed minimal pulmonary vascular congestion.  Concern was raised for acute PE and he was admitted for V/Q scan which was read as normal.  Additional workup has revealed AKI, and Uric Acid >21 raising concern for TLS.    Lab results: Reviewed in Epic  Physical Exam - key components related to admission: General: resting in bed, laying nearly flat, overall comfortable on 2L via [place] HEENT: EOMI, no scleral icterus Cardiac: RRR, no rubs, murmurs or gallops Pulm: mild bibasilar rales Abd: soft, nontender, nondistended, BS present Ext: RLE bandaged c/d/i, chronic hyperpigmentation of skin of LE bilaterally, RLE larger circumference compared to left. Neuro: alert and oriented X4  Vitals:   05/08/2016 1808 05/02/2016 1852 04/22/2016 2123 04/30/16 0500  BP: (!) 156/81 (!) 147/87 (!) 155/83 (!) 148/80  Pulse: 67 77 83   Resp: (!) 22 (!) 24 20 20   Temp: 98 F (36.7 C) 98 F (36.7 C) 97.4 F (36.3 C) 97.5 F (36.4 C)  TempSrc: Oral Oral Oral Oral  SpO2: 100% 99% 100% 100%  Weight:      Height:

## 2016-04-30 NOTE — Progress Notes (Signed)
Dr Inda Castle paged when pt arrived to floor to check validity of transfer order to stepdown on [address]. Dr Outlined what had been written earlier in progress notes and wants pt respiratory status monitored more closely than can be done safely on the floor that he came from.

## 2016-04-30 NOTE — Progress Notes (Signed)
We appreciate prompt orthopedic surgery consultation. He remains afebrile but I agree under the circumstances it is reasonable to start him on a third-generation cephalosporin with Pseudomonas coverage such as Fortax or Cefipime. Oxygen saturation now 100% on 2 L. Marked elevation of uric acid and LDH. Review of the peripheral blood film shows a population of immature monocytes including some monoblasts comprising about 50% of all myeloid elements. Dysplastic changes in the neutrophils. Although these findings are consistent with early leukemic conversion. Total white count actually less this morning at 34,000.  Impression: #1. Unexplained hypoxia. Ventilation/perfusion lung scan was negative for pulmonary embolus. No obvious infiltrate or effusion on chest x-ray. No history of chronic heart or lung disease. I would consider pulmonary leukostasis as potential etiology although this is usually not seen until the white count is much higher. He is had a prompt response to supplemental oxygen.  #2. 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 I discussed this with the patient at length. I will schedule a bone marrow biopsy to be done [place] in his we can get technical assistance. At his age and given his poor performance status, he is not a candidate for aggressive chemotherapy. We can see modest improvement in overall survival with minimal to moderate toxicity with the use of hypomethylating agent such as 5-Azacytidine or decitabine. At present, we need to protect his kidneys. Hydration. Rasburicase. Monitor daily electrolytes, uric acid, and phosphate. Begin allopurinol once uric acid level down to promote excretion of uric acid.  #3. Poor wound healing with chronic stasis ulcers right lower extremity. Multiple surgical manipulations, debridements, skin grafts, and courses of antibiotics. Some of his elevated  LDH may actually be due to surgical debridement done on January 19 visit. I'm also concerned with tissue necrosis as partial etiology of his high LDH and his hypoxia. I would take Dr. Jess Barters recommendation and start him on empiric antibiotic coverage. See above.  His son is a cardiologist in [place]. I will discuss the patient's status with him today.

## 2016-04-30 NOTE — Progress Notes (Signed)
Persistent dyspnea at rest. O2 sat 100% on room air. Oropharyngeal bleeding. Persistent loose stools. Lungs with minimal basilar rales. CXR question early RUL infiltrate. CT chest ordered. Further increase in creatinine to 2.9. No urine output recorded overnight. Impression: #1. High grade MDS R/O conversion to AML Bone marrow biopsy done. Results pending #2. ARF Tumor lysis syndrome - see earlier note #3. Hypoxia - see AM note. For CT chest #4. R leg ulcer - Fortaz started #5. Oropharyngeal bleeding. May be result of lovenox dose given yesterday before it was known he was in renal failure plus moderate thrombocytopenia.   Status reviewed with patient. He has a living will and states he is DNR status. He would not want to be maintained on mechanical life support in the event of an irreversible deterioration in his condition.

## 2016-04-30 NOTE — Progress Notes (Addendum)
Subjective: Continues to have dyspnea, though saturating 100% on 2-3L O2 via [place].  Complaining of back pain consistent with chronic arthritis, relieved by Tylenol.  His fatigue and dyspnea did not improve after 2U pRBCs yesterday.  Discussed his likely leukemic transformation with [name] yesterday evening, and utility of bone marrow biopsy this morning for prognostication.  He thinks he has advanced directives at home, but would like to remain a full code, including CPR, defibrillation, and intubation.  Objective:  Vital signs in last 24 hours: Vitals:   04/11/2016 1808 04/18/2016 1852 04/28/2016 2123 04/30/16 0500  BP: (!) 156/81 (!) 147/87 (!) 155/83 (!) 148/80  Pulse: 67 77 83   Resp: (!) 22 (!) 24 20 20   Temp: 98 F (36.7 C) 98 F (36.7 C) 97.4 F (36.3 C) 97.5 F (36.4 C)  TempSrc: Oral Oral Oral Oral  SpO2: 100% 99% 100% 100%  Weight:      Height:       Physical Exam  Constitutional: He is oriented to person, place, and time.  Mild respiratory distress, occasionally pausing during sentences to breath  Cardiovascular: Normal rate and regular rhythm.   Pulmonary/Chest:  Mild respiratory distress with increased work of breathing Mild bibasilar crackles Good air movement  Neurological: He is alert and oriented to person, place, and time.  Psychiatric: He has a normal mood and affect. His behavior is normal.   CBC Latest Ref Rng & Units 04/30/2016 04/30/2016 04/10/2016  WBC 4.0 - 10.5 K/uL 34.9(H) 34.3(H) 40.0(H)  Hemoglobin 13.0 - 17.0 g/dL 8.2(L) 8.3(L) 7.3(L)  Hematocrit 39.0 - 52.0 % 24.5(L) 24.4(L) 22.3(L)  Platelets 150 - 400 K/uL 70(L) 78(L) 93(L)   CMP Latest Ref Rng & Units 04/30/2016 04/14/2016 03/20/2016  Glucose 65 - 99 mg/dL 91 94 90  BUN 6 - 20 mg/dL 49(H) 39(H) -  Creatinine 0.61 - 1.24 mg/dL 2.92(H) 2.64(H) -  Sodium 135 - 145 mmol/L 133(L) 135 139  Potassium 3.5 - 5.1 mmol/L 3.6 3.4(L) 3.6  Chloride 101 - 111 mmol/L 102 97(L) -  CO2 22 - 32 mmol/L  17(L) 20(L) -  Calcium 8.9 - 10.3 mg/dL 6.9(L) 7.5(L) -  Total Protein 6.5 - 8.1 g/dL 6.8 7.6 -  Total Bilirubin 0.3 - 1.2 mg/dL 1.4(H) 1.5(H) -  Alkaline Phos 38 - 126 U/L 149(H) 178(H) -  AST 15 - 41 U/L 118(H) 132(H) -  ALT 17 - 63 U/L 25 28 -    Component     Latest Ref Rng & Units 04/30/2016  Magnesium     1.7 - 2.4 mg/dL 1.3 (L)   Component     Latest Ref Rng & Units 04/30/2016  Uric Acid, Serum     4.4 - 7.6 mg/dL >21.0 (H)   Component     Latest Ref Rng & Units 04/30/2016  LDH     98 - 192 U/L 5,286 (H)   Respiratory Virus Panel and Influenza 04/30/2016 Negative  Chest Radiographs AP and Lateral 04/30/2016 IMPRESSION: 1. Prominence of the superior mediastinum. This may be secondary to apical lordotic AP technique. Repeat PA lateral chest x-ray suggested for further evaluation.  2. Cardiomegaly with mild bilateral pulmonary interstitial prominence consistent with mild interstitial edema. Small right pleural effusion.  3.  Right upper lobe infiltrate.  Assessment/Plan:  Principal Problem:   Respiratory failure with hypoxia (HCC) Active Problems:   HYPERTENSION, BENIGN SYSTEMIC   MDS (myelodysplastic syndrome), low grade (HCC)   Venous insufficiency (chronic) (peripheral)   Venous  ulcer of right lower extremity with varicose veins (HCC)   Symptomatic anemia   Leukocytosis   AKI (acute kidney injury) ([place])   Hyperuricemia   Acute dyspnea   Stasis ulcer ([place])   83 y.o. male with MDS who presented with 1 week of worsening fatigue, dyspnea, and cough and was found to be more anemic, hypoxic, and with high leukocytosis.  His presentation is concerning for a pulmonary infection, though his chest radiograph does not show any airspace disease.  PE was considered, but V/Q scan was normal.  Principal concern is for infection vs leukemic transformation.  His clinical status is worsening with concern for progression of respiratory and renal failure.  #Hypoxic  Respiratory Failure Stable, potential for rapid deterioration.  History of nonproductive cough, worsening dyspnea, and hypoxemia with normal V/Q scan.  ABG with hypoxemia and respiratory alkalosis. Mildly elevated A-a gradient 58 and P/F 262 suggesting an additional pulmonary process.  Chest radiographs with possible mild pulmonary edema and RUL infiltrate. -Supplemental O2 as needed -CT chest -Cefepime (started 1/23)  #Hyperuricemia #Tumor Lysis Syndrome #Acute Renal Insufficiency Worsening.  Extremely elevated uric acid level and AKI in context of hematologic malignancy consistent with tumor lysis syndrome.  Target UOP 150-200 mL/hr.   Cr 2.64, baseline ~1, BUN/Cr 15, history of poor oral intake, FeNa <1%, suggesting prerenal etiology.  At risk of renal failure from hyperuricemia -rasburicase once -allopurinol (renal dosed in AKI) -daily uric acid -foley -I/Os -IVF NS 150 mL/hr -Follow renal function  #Leukocytosis #MDS #Likely Acute Leukemic Transformation Per Dr Azucena Freed evaluation of his peripheral smear, many blasts rather than bands initially reported by the lab.  Most likely represents leukemic transformation of his MDS, but will continue to rule out infectious etiologies especially in light of his hypoxia. -Hematology ([place]) following -Bone marrow biopsy performed 1/23 -f/u blood cultures -f/u C Diff    #Anemia History of chronic, transfusion-dependent anemia, now below baseline.  Likely contributing in part to his symptoms. Transfused 2U pRBCs without symptomatic improvement.  #Hypokalemia #Hypomagnesemia Repleted K and Mg. -Follow electrolytes  #Chronic Leg Wound Evaluated by Dr Sharol Given, recommended empiric pseudomonal coverage. -WOC consult -Cefepime (started 1/23)  Fluids: NS 150 mL/hr Diet: regular DVT Prophylaxis: lovenox Code Status: full  Dispo: Anticipated discharge in 2-3 days.   [name], MD 04/30/2016, 7:29 AM Pager:  [phone]

## 2016-05-01 ENCOUNTER — Inpatient Hospital Stay (HOSPITAL_COMMUNITY): Payer: Medicare Other

## 2016-05-01 DIAGNOSIS — D689 Coagulation defect, unspecified: Secondary | ICD-10-CM

## 2016-05-01 DIAGNOSIS — C9590 Leukemia, unspecified not having achieved remission: Secondary | ICD-10-CM

## 2016-05-01 DIAGNOSIS — J9601 Acute respiratory failure with hypoxia: Secondary | ICD-10-CM

## 2016-05-01 DIAGNOSIS — Z7189 Other specified counseling: Secondary | ICD-10-CM

## 2016-05-01 DIAGNOSIS — N179 Acute kidney failure, unspecified: Secondary | ICD-10-CM

## 2016-05-01 DIAGNOSIS — I83019 Varicose veins of right lower extremity with ulcer of unspecified site: Secondary | ICD-10-CM

## 2016-05-01 DIAGNOSIS — D500 Iron deficiency anemia secondary to blood loss (chronic): Secondary | ICD-10-CM

## 2016-05-01 DIAGNOSIS — R0902 Hypoxemia: Secondary | ICD-10-CM

## 2016-05-01 LAB — CBC WITH DIFFERENTIAL/PLATELET
HCT: 21.9 % — ABNORMAL LOW (ref 39.0–52.0)
Hemoglobin: 7.3 g/dL — ABNORMAL LOW (ref 13.0–17.0)
MCH: 29.9 pg (ref 26.0–34.0)
MCHC: 33.3 g/dL (ref 30.0–36.0)
MCV: 89.8 fL (ref 78.0–100.0)
PLATELETS: 89 10*3/uL — AB (ref 150–400)
RBC: 2.44 MIL/uL — ABNORMAL LOW (ref 4.22–5.81)
RDW: 22.6 % — AB (ref 11.5–15.5)
WBC: 44.9 10*3/uL — AB (ref 4.0–10.5)

## 2016-05-01 LAB — BLOOD GAS, ARTERIAL
ACID-BASE DEFICIT: 7.3 mmol/L — AB (ref 0.0–2.0)
Bicarbonate: 16.5 mmol/L — ABNORMAL LOW (ref 20.0–28.0)
DRAWN BY: 257881
O2 CONTENT: 5 L/min
O2 SAT: 83.4 %
PCO2 ART: 26.2 mmHg — AB (ref 32.0–48.0)
Patient temperature: 98.6
pH, Arterial: 7.414 (ref 7.350–7.450)
pO2, Arterial: 54.4 mmHg — ABNORMAL LOW (ref 83.0–108.0)

## 2016-05-01 LAB — RENAL FUNCTION PANEL
ALBUMIN: 2.8 g/dL — AB (ref 3.5–5.0)
ANION GAP: 13 (ref 5–15)
BUN: 56 mg/dL — AB (ref 6–20)
CHLORIDE: 102 mmol/L (ref 101–111)
CO2: 17 mmol/L — ABNORMAL LOW (ref 22–32)
Calcium: 6.7 mg/dL — ABNORMAL LOW (ref 8.9–10.3)
Creatinine, Ser: 3.02 mg/dL — ABNORMAL HIGH (ref 0.61–1.24)
GFR, EST AFRICAN AMERICAN: 21 mL/min — AB (ref >60)
GFR, EST NON AFRICAN AMERICAN: 18 mL/min — AB (ref >60)
Glucose, Bld: 84 mg/dL (ref 65–99)
PHOSPHORUS: 5.9 mg/dL — AB (ref 2.5–4.6)
POTASSIUM: 3.6 mmol/L (ref 3.5–5.1)
Sodium: 132 mmol/L — ABNORMAL LOW (ref 135–145)

## 2016-05-01 LAB — URINE CULTURE: Culture: NO GROWTH

## 2016-05-01 LAB — PATHOLOGIST SMEAR REVIEW

## 2016-05-01 LAB — HEPATIC FUNCTION PANEL
ALBUMIN: 2.7 g/dL — AB (ref 3.5–5.0)
ALT: 26 U/L (ref 17–63)
AST: 126 U/L — ABNORMAL HIGH (ref 15–41)
Alkaline Phosphatase: 138 U/L — ABNORMAL HIGH (ref 38–126)
Bilirubin, Direct: 0.4 mg/dL (ref 0.1–0.5)
Indirect Bilirubin: 0.7 mg/dL (ref 0.3–0.9)
TOTAL PROTEIN: 6.7 g/dL (ref 6.5–8.1)
Total Bilirubin: 1.1 mg/dL (ref 0.3–1.2)

## 2016-05-01 LAB — BRAIN NATRIURETIC PEPTIDE: B NATRIURETIC PEPTIDE 5: 665.5 pg/mL — AB (ref 0.0–100.0)

## 2016-05-01 LAB — MAGNESIUM: MAGNESIUM: 1.5 mg/dL — AB (ref 1.7–2.4)

## 2016-05-01 LAB — C DIFFICILE QUICK SCREEN W PCR REFLEX
C DIFFICILE (CDIFF) INTERP: NOT DETECTED
C DIFFICILE (CDIFF) TOXIN: NEGATIVE
C DIFFICLE (CDIFF) ANTIGEN: NEGATIVE

## 2016-05-01 LAB — GLUCOSE, CAPILLARY: Glucose-Capillary: 91 mg/dL (ref 65–99)

## 2016-05-01 LAB — RASBURICASE - URIC ACID: Uric Acid, Serum: 5.7 mg/dL (ref 4.4–7.6)

## 2016-05-01 LAB — LACTATE DEHYDROGENASE: LDH: 6492 U/L — AB (ref 98–192)

## 2016-05-01 MED ORDER — MORPHINE SULFATE (PF) 2 MG/ML IV SOLN: 1.0000 mg | INTRAVENOUS | Status: DC | PRN

## 2016-05-01 MED ORDER — SODIUM CHLORIDE 0.9 % IV SOLN
INTRAVENOUS | Status: DC
  Administered 2016-05-01: 10:00:00 via INTRAVENOUS

## 2016-05-01 MED ORDER — ALLOPURINOL 100 MG PO TABS
200.0000 mg | ORAL_TABLET | Freq: Every day | ORAL | Status: DC
  Administered 2016-05-01 – 2016-05-02 (×2): 200 mg via ORAL
  Filled 2016-05-01 (×2): qty 2

## 2016-05-01 MED ORDER — OSELTAMIVIR PHOSPHATE 30 MG PO CAPS
30.0000 mg | ORAL_CAPSULE | Freq: Every day | ORAL | Status: DC
  Administered 2016-05-01: 30 mg via ORAL
  Filled 2016-05-01 (×2): qty 1

## 2016-05-01 MED ORDER — MAGNESIUM SULFATE 2 GM/50ML IV SOLN
2.0000 g | Freq: Once | INTRAVENOUS | Status: AC
  Administered 2016-05-01: 2 g via INTRAVENOUS
  Filled 2016-05-01: qty 50

## 2016-05-01 MED ORDER — SODIUM BICARBONATE 8.4 % IV SOLN
INTRAVENOUS | Status: DC
  Administered 2016-05-01 – 2016-05-03 (×5): via INTRAVENOUS
  Filled 2016-05-01 (×12): qty 150

## 2016-05-01 MED ORDER — MORPHINE SULFATE (PF) 2 MG/ML IV SOLN
0.5000 mg | INTRAVENOUS | Status: DC | PRN
  Administered 2016-05-01 – 2016-05-02 (×4): 0.5 mg via INTRAVENOUS
  Filled 2016-05-01 (×4): qty 1

## 2016-05-01 MED ORDER — ACETAMINOPHEN 325 MG PO TABS
650.0000 mg | ORAL_TABLET | Freq: Four times a day (QID) | ORAL | Status: DC | PRN
  Administered 2016-05-01: 650 mg via ORAL
  Filled 2016-05-01: qty 2

## 2016-05-01 MED ORDER — HYDROCODONE-ACETAMINOPHEN 5-325 MG PO TABS: 1.0000 | ORAL_TABLET | Freq: Four times a day (QID) | ORAL | Status: DC | PRN

## 2016-05-01 NOTE — Progress Notes (Signed)
Hematology: I personally reviewed the light microscopy slides on the bone marrow aspiration and biopsy with the hematopathologist. There is an 80% infiltration of the bone marrow with large, bizarre, immature, mononuclear cells with cleaved nuclei likely in the myeloid/monocytic lineage but difficult to phenotype by light microscopy. Megakaryocytes are present. Many are abnormal. In the more mature myeloid cells present there are dysplastic changes with hypo-granularity and other abnormal nuclear changes. Findings are consistent with a malignant hematologic condition. 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 The pathologist wants to wait for flow cytometry results to be more specific on the diagnosis before generating an official report. I reviewed his chest CT scan again with the radiologist. Findings compatible with an opportunistic infection, virus at the top of the list, but given the large, immature, circulating blasts, I would also be concerned that we are dealing with leukemic pulmonary infiltrates. He is currently oliguric with only 250 mL of urine output so far today.  Impression: Heavy involvement of bone marrow with a malignant leukemic process in the face of acute hypoxic respiratory failure and acute renal failure.  Given his age, and current pulmonary and renal dysfunction, attempts at any chemotherapy would cause more harm than good. Even if his condition would stabilize, treatment options would be very limited and I am not sure that a hypomethylating agent would work with such an undifferentiated leukemia. I spoke with the patient and his wife at the bedside and then with the patient's son who is a physician by phone. I am recommending a more palliative approach concentrating on his comfort. I do not believe that aggressive treatment with ventilatory and/or dialysis support in the absence of a definitive way to reverse his leukemia is in his best interest. He and his family understand and  agree. He was evaluated by our critical care physicians. We will start a trial of BiPAP to see if we can improve his respiratory status. We have started an empiric trial of Tamiflu. We will use morphine as needed to ease respirations if he develops progressive increased work of breathing.

## 2016-05-01 NOTE — Progress Notes (Signed)
Internal Medicine Attending:   I saw and examined the patient. I reviewed the resident's note and I agree with the resident's findings and plan as documented in the resident's note. Joel Macdonald's has deteriorated overnight, now with increased O2 requirement.  On my exam he is now on face mask.  His CT revealed hazy airspace disease and ground glass opacities, leading differential his pulmonary edema versus viral infection.  His pulmonary exam reveals scattered crackles and rhonchi.  He has some mild accessory muscle use.  We discussed his case with [name] who has had EOL discussions with patient and family, he potentially would be willing to be intubated if needed but has reasonable expectations, part of this will depend on BM path result.  His son will be visiting on Saturday.  He does not want CPR in the event of cardiopulmonary collapse. Otherwise [name] is very pleasant and polite on exam.  He is well aware and informed of his care and pending studies. His uric acid has responded well  To rasburicase, will change to allopurinol.  UOP appears to be slightly improving with 1L out today, his SCr has stabilized.  He does have a mild NAGMA which is likely secondary to his AKI. Plan: Reduce IVF Monitor UOP Consult pulmonology Continue supportive care Follow BM path. Start empiric oseltamavir (renally dosed) for possible flu component although PCR negative (low harm and flu is currently prevalent)

## 2016-05-01 NOTE — Progress Notes (Addendum)
Subjective: Feels like his breathing is getting worse and that he is not getting enough oxygen.  Getting tired and chest muscles sore from breathing hard.  Now on 4-5L O2 via [place] to maintain O2 sats in low 90s.  Objective:  Vital signs in last 24 hours: Vitals:   04/30/16 1926 04/30/16 2309 05/01/16 0402 05/01/16 0800  BP:  (!) 152/79 (!) 151/82 (!) 144/78  Pulse: 94 95 94 95  Resp: (!) 25 (!) 36 (!) 37 (!) 27  Temp:  97.9 F (36.6 C) 97.9 F (36.6 C) 98.1 F (36.7 C)  TempSrc:  Oral Oral Oral  SpO2: 94% 93% 94% 91%  Weight:      Height:        Intake/Output Summary (Last 24 hours) at 05/01/16 0856 Last data filed at 05/01/16 0800  Gross per 24 hour  Intake          1031.67 ml  Output              975 ml  Net            56.67 ml    Physical Exam  Constitutional: He is oriented to person, place, and time.  Moderate respiratory distress on 5L , pausing during sentences to breath  Cardiovascular: Normal rate and regular rhythm.   Pulmonary/Chest:  Moderate respiratory distress, tachypneic, with mildly increased work of breathing Mild coarse crackles diffusely Good air movement  Musculoskeletal:  No peripheral edema  Neurological: He is alert and oriented to person, place, and time.  Psychiatric: He has a normal mood and affect. His behavior is normal.   CBC Latest Ref Rng & Units 04/30/2016 04/30/2016 04/11/2016  WBC 4.0 - 10.5 K/uL 34.9(H) 34.3(H) 40.0(H)  Hemoglobin 13.0 - 17.0 g/dL 8.2(L) 8.3(L) 7.3(L)  Hematocrit 39.0 - 52.0 % 24.5(L) 24.4(L) 22.3(L)  Platelets 150 - 400 K/uL 70(L) 78(L) 93(L)   CMP Latest Ref Rng & Units 04/30/2016 04/30/2016 03/20/2016  Glucose 65 - 99 mg/dL 91 94 90  BUN 6 - 20 mg/dL 49(H) 39(H) -  Creatinine 0.61 - 1.24 mg/dL 2.92(H) 2.64(H) -  Sodium 135 - 145 mmol/L 133(L) 135 139  Potassium 3.5 - 5.1 mmol/L 3.6 3.4(L) 3.6  Chloride 101 - 111 mmol/L 102 97(L) -  CO2 22 - 32 mmol/L 17(L) 20(L) -  Calcium 8.9 - 10.3 mg/dL 6.9(L) 7.5(L) -    Total Protein 6.5 - 8.1 g/dL 6.8 7.6 -  Total Bilirubin 0.3 - 1.2 mg/dL 1.4(H) 1.5(H) -  Alkaline Phos 38 - 126 U/L 149(H) 178(H) -  AST 15 - 41 U/L 118(H) 132(H) -  ALT 17 - 63 U/L 25 28 -   Component     Latest Ref Rng & Units 04/19/2016 05/01/2016  O2 Content     L/min  5.0  Delivery systems       NASAL CANNULA  pH, Arterial     7.350 - 7.450 7.463 (H) 7.414  pCO2 arterial     32.0 - 48.0 mmHg 28.8 (L) 26.2 (L)  pO2, Arterial     83.0 - 108.0 mmHg 54.6 (L) 54.4 (L)  Bicarbonate     20.0 - 28.0 mmol/L 20.3 16.5 (L)  Acid-base deficit     0.0 - 2.0 mmol/L 2.9 (H) 7.3 (H)  O2 Saturation     % 84.9 83.4  Patient temperature      98.6 98.6  Collection site      RIGHT RADIAL RIGHT RADIAL  Drawn by  [id number]  Sample type      ARTERIAL DRAW ARTERIAL  Allens test (pass/fail)     PASS PASS PASS  A-a                                                          58                      198 P/f                                                          262                     136   Component     Latest Ref Rng & Units 04/30/2016 05/01/2016  Uric Acid, Serum     4.4 - 7.6 mg/dL >21.0 (H) 5.7   Component     Latest Ref Rng & Units 04/30/2016  LDH     98 - 192 U/L 5,286 (H)   Respiratory Virus Panel and Influenza 04/15/2016 Negative  CT Chest w/o Contrast 04/30/2016 IMPRESSION: Hazy parenchymal changes throughout lungs slightly greater centrally and minimally more notable on the right. Small right-sided pleural effusion. This suggests mild pulmonary edema whether cardiogenic or noncardiogenic in origin. Result of drug reaction could give similar changes.  Left upper lobe ground-glass opacities largest measuring 1.8 cm (series 4, image 35) suggests that infection or adenocarcinoma cannot be entirely excluded. Non-contrast chest CT at 3-6 months is recommended. If nodules persist, subsequent management will be based upon the most suspicious nodule(s). This recommendation  follows the consensus statement: Guidelines for Management of Incidental Pulmonary Nodules Detected on CT Images: From the Fleischner Society 2017; Radiology 2017; 284:228-243.  Assessment/Plan:  Principal Problem:   Acute respiratory failure with hypoxia (HCC) Active Problems:   HYPERTENSION, BENIGN SYSTEMIC   MDS (myelodysplastic syndrome), low grade (HCC)   Venous insufficiency (chronic) (peripheral)   Venous ulcer of right lower extremity with varicose veins (HCC)   Symptomatic anemia   Leukocytosis   AKI (acute kidney injury) ([place])   Hyperuricemia   Stasis ulcer (HCC)   Tumor lysis syndrome   83 y.o. male with MDS who presented with 1 week of worsening fatigue, dyspnea, and cough and was found to be more anemic, hypoxic, and with high leukocytosis.  His presentation is concerning for a pulmonary infection, though his chest radiograph does not show any airspace disease.  PE was considered, but V/Q scan was normal.  Principal concern is for infection vs leukemic transformation.  His clinical status is worsening with concern for progression of respiratory and renal failure.  #Hypoxic Respiratory Failure Worsening.  History of nonproductive cough, worsening dyspnea, and hypoxemia with normal V/Q scan.  Increasing O2 requirement and worsening A-a gradient since admission.  Chest radiographs and CT with possible mild pulmonary edema and RUL infiltrate.  Possibility of pulmonary edema with the copius IV hydration he has been receiving, or viral pneumonia from unidentified virus. -Supplemental O2 as needed -Cefepime (started 1/23) -start empiric oseltamavir -PCCM consult  #Hyperuricemia #Tumor Lysis Syndrome #Acute Renal Insufficiency  Stable, Cr has plateaued but concern for oliguria (875 mL out in last 24 hrs).  Uric acid level was extremely elevated consistent with tumor lysis syndrome, normalized after rasburicase, now on allopurinol. -allopurinol 200 mg daily -daily uric  acid -foley -I/Os -IVF NS 100 mL/hr -Follow renal function  #Leukocytosis #MDS #Likely Acute Leukemic Transformation Per Dr Azucena Freed evaluation of his peripheral smear, many blasts rather than bands initially reported by the lab.  Most likely represents leukemic transformation of his MDS, but will continue to rule out infectious etiologies especially in light of his hypoxia.  Infectious workup negative so far; negative RVP, CDiff, influenza.  Blood cultures were not collected as intended prior to initiating antibiotics due to conflict with RBC transfusion. -Hematology ([place]) following -Bone marrow biopsy performed 1/23  #Anemia History of chronic, transfusion-dependent anemia, now below baseline.  Likely contributing in part to his symptoms. Transfused 2U pRBCs without symptomatic improvement.  #Hypokalemia #Hypomagnesemia Repleted K and Mg. -Follow electrolytes  #Chronic Leg Wound Evaluated by Dr Sharol Given, recommended empiric pseudomonal coverage. -WOC consult -Cefepime (started 1/23)  Fluids: NS 150 mL/hr Diet: regular DVT Prophylaxis: lovenox Code Status: full  Dispo: Anticipated discharge in 2-3 days.   [name], MD 05/01/2016, 7:05 AM Pager: [phone]

## 2016-05-01 NOTE — Progress Notes (Signed)
Walking by room and patient was positioning partially out of bed with monitor alarming for O2 saturations. Patient with venti mask out of position (on his forehead) and saturations 81%. When attempting to fix mask, noted patient was incontinent of stool and appeared confused with stool all over hands, body, bed, linens, gown. Called for assist from staff nurses, new venti mask positioned and patient hygiene and linen change performed. Saturations improved to 93% with mask in proper position. Left patient with nsg tech in room.   Alben Deeds, [place] DPT [phone]

## 2016-05-01 NOTE — Consult Note (Signed)
PULMONARY / CRITICAL CARE MEDICINE   Name: Joel Macdonald MRN: [id number] DOB: Jan 11, 1931    ADMISSION DATE:  04/27/2016 CONSULTATION DATE:  1/24  REFERRING MD:  Beryle Beams   CHIEF COMPLAINT:  Respiratory failure   HISTORY OF PRESENT ILLNESS:   83 year old white male w/ known h/o MDS who presented to his hematologist on 1/23 w/ cc: fatigue, dyspnea, weakness, NP cough and poor appetite X about 1 week. Symptoms were similar to prior episodes of anemia which is why he went for evaluation. He was found to be hypoxic in the Office and STAT labs were sent. This showed WBC count 40K, uric acid was >21 so he was admitted w/ working dx of tumor lysis syndrome, concern for transformation to acute leukemia and also evaluation of evolving pulmonary infiltrates. PCCM was asked to see on 1/24 w/ worsening hypoxia  PAST MEDICAL HISTORY :  He  has a past medical history of Abscess of skin (11/17/2014); Cellulitis and abscess of leg (05/12/2015); Cellulitis of leg, right (07/12/2013); Dyspnea; Edema, peripheral (03/21/2014); GERD (gastroesophageal reflux disease); Heart murmur (1936); History of blood transfusion; Hypothyroidism; Inguinal hernia; Ischemia of extremity (07/12/2013); Lymphedema; MDS (myelodysplastic syndrome) ([place]); MDS (myelodysplastic syndrome), high grade ([place]) (06/13/2014); MDS (myelodysplastic syndrome), low grade ([place]) (09/20/2011); MGUS (monoclonal gammopathy of unknown significance) (06/17/2011); Monocytosis (03/21/2014); Osteoarthritis; Pancytopenia, acquired ([place]) (06/17/2011); Prostate cancer ([place]); and Senile purpura ([place]) (10/18/2014).  PAST SURGICAL HISTORY: He  has a past surgical history that includes Tonsillectomy and adenoidectomy (~ 1939); Robot assisted laparoscopic radical prostatectomy (~ 2010); Cataract extraction w/ intraocular lens  implant, bilateral (Bilateral, ~ 2012); I&D extremity (Right, 05/13/2015); I&D extremity (Right, 05/17/2015); Application if wound vac (Right, 05/17/2015); Skin  split graft (Right, 12/06/2015); I&D extremity (Right, 03/16/2016); and Skin split graft (Right, 03/20/2016).  Allergies  Allergen Reactions  . Sulfa Antibiotics Other (See Comments)    Per patient, this "was not tolerated very well"  . Doxycycline Nausea And Vomiting    Upset stomach   . Erythromycin Nausea And Vomiting    Minor upset stomach - pt tolerates med ok   . Other Other (See Comments)    [facility]AG[facility] >> NASAL CONGESTION    No current facility-administered medications on file prior to encounter.    Current Outpatient Prescriptions on File Prior to Encounter  Medication Sig  . acetaminophen (TYLENOL) 500 MG tablet Take 500-1,000 mg by mouth every 6 (six) hours as needed for moderate pain or headache.   Marland Kitchen aspirin 325 MG tablet Take 325-650 mg by mouth daily as needed for moderate pain.   . Aspirin Effervescent (ALKA-SELTZER EXTRA STRENGTH) 500 MG TBEF Take 1,000 mg by mouth every 4 (four) hours as needed (pain/ indigestion/ heartburn).  . fluticasone (FLONASE) 50 MCG/ACT nasal spray Place 1 spray into both nostrils daily as needed (seasonal allergies).  Marland Kitchen ibuprofen (ADVIL,MOTRIN) 200 MG tablet Take 200-400 mg by mouth every 6 (six) hours as needed for mild pain.   Marland Kitchen levothyroxine (SYNTHROID) 50 MCG tablet Take 36mcg every morning on an empty stomach  . ranitidine (ZANTAC) 150 MG tablet Take 150 mg by mouth daily as needed for heartburn.  Marland Kitchen oxyCODONE-acetaminophen (PERCOCET/ROXICET) 5-325 MG tablet Take 1 tablet by mouth every 4 (four) hours as needed for severe pain. (Patient not taking: Reported on 04/16/2016)  . sulfamethoxazole-trimethoprim (BACTRIM DS) 800-160 MG tablet Take 1 tablet by mouth 2 (two) times daily. (Patient not taking: Reported on 04/22/2016)    FAMILY HISTORY:  His indicated that his mother is deceased.  He indicated that his father is deceased. He indicated that the status of his son is unknown.    SOCIAL HISTORY: He  reports that he quit smoking about 22 years  ago. His smoking use included Pipe. He quit after 40.00 years of use. He has never used smokeless tobacco. He reports that he drinks about 4.2 oz of alcohol per week . He reports that he does not use drugs.  REVIEW OF SYSTEMS:   In acute distress  SUBJECTIVE:  Hypoxic on high O2 delivery   VITAL SIGNS: BP (!) 144/78 (BP Location: Left Arm)   Pulse 95   Temp 98.1 F (36.7 C) (Oral)   Resp (!) 27   Ht 6\' 1"  (1.854 m)   Wt 182 lb 15.7 oz (83 kg)   SpO2 93%   BMI 24.14 kg/m   HEMODYNAMICS:    VENTILATOR SETTINGS: FiO2 (%):  [45 %] 45 %  INTAKE / OUTPUT: I/O last 3 completed shifts: In: 2278.3 [I.V.:1845; Blood:333.3; IV Piggyback:100] Out: 825 [Urine:825]  PHYSICAL EXAMINATION: General:  Acutely ill appearing white male, Currently on face mask Oxygen device, covered in feces  Neuro:  Awake, anxious. Moves all extremities  HEENT:  NCAT, no JVD Cardiovascular:  RRR w/out MRG Lungs:  + Tachypnea, posterior rales.  Abdomen:  Soft, + bowel sounds Musculoskeletal:  Equal st and bulk  Skin:  Warm and dry   LABS:  BMET  Recent Labs Lab 04/10/2016 0938 04/30/16 0931 05/01/16 0657  NA 135 133* 132*  K 3.4* 3.6 3.6  CL 97* 102 102  CO2 20* 17* 17*  BUN 39* 49* 56*  CREATININE 2.64* 2.92* 3.02*  GLUCOSE 94 91 84    Electrolytes  Recent Labs Lab 04/16/2016 0938 04/30/16 0058 04/30/16 0931 05/01/16 0657 05/01/16 0837  CALCIUM 7.5*  --  6.9* 6.7*  --   MG  --  1.3*  --   --  1.5*  PHOS  --   --  5.5* 5.9*  --     CBC  Recent Labs Lab 04/30/16 0058 04/30/16 0931 05/01/16 0657  WBC 34.3* 34.9* 44.9*  HGB 8.3* 8.2* 7.3*  HCT 24.4* 24.5* 21.9*  PLT 78* 70* 89*    Coag's  Recent Labs Lab 04/30/16 1700  APTT 56*  INR 1.58    Sepsis Markers  Recent Labs Lab 04/14/2016 1507 04/30/16 0058  LATICACIDVEN 1.1  --   PROCALCITON  --  1.53    ABG  Recent Labs Lab 04/22/2016 1155 05/01/16 0835  PHART 7.463* 7.414  PCO2ART 28.8* 26.2*  PO2ART 54.6*  54.4*    Liver Enzymes  Recent Labs Lab 04/11/2016 0938 04/30/16 0931 05/01/16 0657 05/01/16 0837  AST 132* 118*  --  126*  ALT 28 25  --  26  ALKPHOS 178* 149*  --  138*  BILITOT 1.5* 1.4*  --  1.1  ALBUMIN 3.3* 2.9* 2.8* 2.7*    Cardiac Enzymes No results for input(s): TROPONINI, PROBNP in the last 168 hours.  Glucose No results for input(s): GLUCAP in the last 168 hours.  Imaging Ct Chest Wo Contrast  Result Date: 04/30/2016 CLINICAL DATA:  83 year old male with shortness breath worse on exertion. Subsequent encounter. EXAM: CT CHEST WITHOUT CONTRAST TECHNIQUE: Multidetector CT imaging of the chest was performed following the standard protocol without IV contrast. COMPARISON:  04/30/2016 chest x-ray. No comparison chest CT. Lumbar spine MR 02/21/2015. FINDINGS: Cardiovascular: Coronary artery calcifications. Mitral valve calcifications. Calcification aorta. Calcified aorta with descending aorta ectatic  deviating to the right. Mediastinum/Nodes: Slight prominence size subcarinal lymph node possibly reactive in origin. Small hiatal hernia. Lungs/Pleura: Hazy parenchymal changes throughout lungs slightly greater centrally and minimally more notable on the right. Small right-sided pleural effusion. This suggests mild pulmonary edema whether cardiogenic or noncardiogenic in origin. Result of drug reaction could give similar changes. The additional finding of focal opacities left upper lobe the largest measuring 1.8 cm and having a ground-glass appearance suggests that infection or adenocarcinoma cannot be entirely excluded. Upper Abdomen: Prominent splenomegaly. Adenopathy peripancreatic region. Musculoskeletal: Remote prominent compression fracture L2 with retropulsion similar to prior MR. scoliosis thoracic spine. 1.7 cm lucency right humeral head possibly degenerative in origin as there are glenohumeral joint degenerative changes. Less notable is a subchondral cystic changes left  glenohumeral joint. IMPRESSION: Hazy parenchymal changes throughout lungs slightly greater centrally and minimally more notable on the right. Small right-sided pleural effusion. This suggests mild pulmonary edema whether cardiogenic or noncardiogenic in origin. Result of drug reaction could give similar changes. Left upper lobe ground-glass opacities largest measuring 1.8 cm (series 4, image 35) suggests that infection or adenocarcinoma cannot be entirely excluded. Non-contrast chest CT at 3-6 months is recommended. If nodules persist, subsequent management will be based upon the most suspicious nodule(s). This recommendation follows the consensus statement: Guidelines for Management of Incidental Pulmonary Nodules Detected on CT Images: From the Fleischner Society 2017; Radiology 2017; 284:228-243. Prominent splenomegaly. Peripancreatic adenopathy. Aortic atherosclerosis. Coronary artery calcifications. Remote prominent compression fracture L2 with retropulsion similar to prior MR. 1.7 cm lucency right humeral head possibly degenerative in origin as there are glenohumeral joint degenerative changes. Cannot exclude involvement by tumor. Electronically Signed   By: Genia Del M.D.   On: 04/30/2016 16:33   Dg Chest Port 1 View  Result Date: 05/01/2016 CLINICAL DATA:  Dyspnea EXAM: PORTABLE CHEST 1 VIEW COMPARISON:  04/30/2016 FINDINGS: Cardiac shadow is mildly enlarged but stable. Lungs are well aerated bilaterally. Mild patchy changes are noted within the right lung similar to that seen on recent CT examination. These are most marked in the upper lobe. No sizable effusion is seen. No bony abnormality is noted. IMPRESSION: Mild patchy infiltrative changes similar to that seen on recent CT. Electronically Signed   By: Inez Catalina M.D.   On: 05/01/2016 09:26     STUDIES:  CT chest 1/23: Hazy parenchymal changes throughout lungs slightly greater centrally and minimally more notable on the right. Small  right-sided pleural Effusion. Left upper lobe ground-glass opacities largest measuring 1.8 cm VQ 1/23: nml  CULTURES: RVP 1/22: negative  cdiff 1/22: negative UC 1/22 negative   ANTIBIOTICS: Cefepime   SIGNIFICANT EVENTS:   LINES/TUBES:  DISCUSSION: 83 year old male w/ known h/o MDS. Admitted w/ working dx of presumed transformation to acute leukemia, probable tumor lysis syndrome and evolving pulmonary infiltrates. BM bx has been completed and is pending. He has been getting empiric abx. Allopurinol and hydration has been started for TLS. PCCM now seeing for worsening respiratory failure which appears to be pulmonary edema.  Plan Try NIPPV as PEEP therapy should help w/ edema Repeat CXR Change IVF to bicarb May need intubation but would need to see what his acute hematologic options are before we commit him to invasive therapy w/out plans to treat the underlying cause.   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 D-dx: pulmonary edema vs evolving ALI/TRALI vs PNA.  ->RVP was negative P:   Cont supplemental oxygen  Pulse ox Full code; so would intubate IF intubated would get respiratory culture  F/u PRN ABG F/u CXR  Can try BIPAP first (Peep therapy could be helpful here)  CARDIOVASCULAR A:  Mild hypertension  Prob pulmonary edema  P:  Tele Cycle CEs given resp failure  Repeat ECHO  RENAL A:   AKI Hyponatremia  NAGMA Hypomagnesemia  P:   Bicarb supplementation w/  IVFs Replace MgSO4 Strict I&O Renal dose meds    GASTROINTESTINAL A:   Diarrhea-->CDiff neg P:   NPO given aspiration risk   INFECTIOUS A:   RLE stasis ulcer  R/o sepsis (unlikely) P:   Cont cefepime  F/u cultures Can probably dc tamiflu   ENDOCRINE A:   Hypothyroidism  P:   Cont synthroid supplementation   NEUROLOGIC A:   Anxiety @ risk for encephalopathy  P:   Holding sedating meds for now Supportive care    FAMILY  - Updates:   - Inter-disciplinary family meet or Palliative Care meeting due by:  2/1   45 minutes PCCM time   Erick Colace ACNP-BC [place] Pager # [phone] OR # [phone] if no answer  Attending Note:  83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  Procalcitonin is also elevated indicating a concern for a bacterial infection.  Patient has associated acute renal failure and is being hydrated and given bicarb with low UOP and anion-gap metabolic acidosis.  PCCM was called on consultation due to evolving hypoxemic respiratory failure.  I reviewed CXR and chest CT myself, pulmonary edema with concern for pulmonary infiltrative process noted.  2D echo from 2015 with EF of [id number], grade 1 diastolic dysfunction and mild pulmonary hypertension (PAP of 34).  Spoke with Dr. Waymon Budge (H/O and Primary MD), review of bone marrow consistent with acute severe leukemia, patient is not a candidate for chemotherapy given severity so this is not a curable process.  Dr. Waymon Budge and myself spoke with patient and after discussion he elected to be a full DNR with no further escalation of care.  Will make BiPAP available for comfort purposes only and morphine as needed which will be provided by the primary team.  PCCM will sign off, please call back if needed.  The patient is critically ill with multiple organ systems failure and requires  high complexity decision making for assessment and support, frequent evaluation and titration of therapies, application of advanced monitoring technologies and extensive interpretation of multiple databases.   Critical Care Time devoted to patient care services described in this note is  45  Minutes. This time reflects time of care of this signee Dr Jennet Maduro. This critical care time does not reflect procedure time, or teaching time or supervisory time of PA/NP/Med student/Med Resident etc but could involve care discussion time.  Rush Farmer, M.D. [facility] Pulmonary/Critical Care Medicine. Pager: [phone]. After hours pager: [phone].  05/01/2016, 11:16 AM

## 2016-05-01 NOTE — Progress Notes (Signed)
Pt constantly moaning and groaning. Disrobing, no attempts to exit bed. Takes  ventimask off and O2 sats drops to 78%. Dyspneic. RR 30s. Pt restless and appears uncomfortable. Gave Morphine as ordered. No relief noted. Spoke with provider on call and stated she will review patient's chart. Will continue to monitor.

## 2016-05-01 NOTE — Progress Notes (Signed)
Hematology: Ongoing respiratory distress.  O2 sats 92% on 5 liters O2. ABG: pH 7.4, pO2 54, pCO2 26:  CT reviewed w Radiologist: unusual pattern - hazy parenchymal changes throughout the lungs. Minimal right pleural effusion. Small, vague, nodular densities LUL. Not clear if this is an infectious process, early pulmonary edema, or a hypersensitivity drug reaction. No recent new meds.  Influenza A & respiratory virus panel negative. Uric acid down to normal post rasburicase.@5 .7 Renal function has plateaued for now w Cr 2.9. 850 cc urine output PE: Blood pressure (!) 144/78, pulse 95, temperature 98.1 F (36.7 C), temperature source Oral, resp. rate (!) 27, height 6\' 1"  (1.854 m), weight 182 lb 15.7 oz (83 kg), SpO2 93 %. dyspneic at rest; acyanotic; extremities warm, well perfused Lungs clear to auscultation Reg rhythm no S3, no JVD, no LLE edema Tender RUQ  Additional lab: WBC holding @ 35,000 25% monocytes, platelets 70,000, Hb 8.2 Procalcitonin 1.5 reliable? w renal dysfunction PTT 56 after 1 dose of lovenox   Impression: #1. Hypoxia/diffuse abnormal parenchymal changes in lungs on CT Main concern is an opportunisitic viral  Infection. Not much we can do other than supportive care at this time. Consider Pulm/Crit Care consult.  #2. Tumor lysis syndrome related to leukemic conversion vs organ ischemia/infarction Start allopurinol 200 mg daily; continue hydration; daily CMET, LDH, Uric acid, Phosphate; strict I&O  #3. Probable leukemic conversion of MDS. I will review BM bx today  #4. Transient coagulopathy Recent use of anitplatelet agents and therapuetic lovenox dose given on admission before renal function available. I have stopped prophylactic lovenox at this time.  #5. Healing chronic RLE stasis ulcer on [place].  Dispo: I talked w pt at bedside then wife by phone then MD son Octavia Bruckner by [phone]) If patient pulmonary status worsens and he wears out, all parties in  favor of temporary mechanical ventilation as a comfort measure unless situation clearly irreversible.   Murriel Hopper, MD, [place]  Hematology-Oncology/Internal Medicine

## 2016-05-02 ENCOUNTER — Inpatient Hospital Stay (HOSPITAL_COMMUNITY): Payer: Medicare Other

## 2016-05-02 DIAGNOSIS — J984 Other disorders of lung: Secondary | ICD-10-CM

## 2016-05-02 DIAGNOSIS — C9200 Acute myeloblastic leukemia, not having achieved remission: Secondary | ICD-10-CM

## 2016-05-02 LAB — HEPATIC FUNCTION PANEL
ALBUMIN: 2.8 g/dL — AB (ref 3.5–5.0)
ALT: 104 U/L — ABNORMAL HIGH (ref 17–63)
AST: 298 U/L — AB (ref 15–41)
Alkaline Phosphatase: 138 U/L — ABNORMAL HIGH (ref 38–126)
BILIRUBIN TOTAL: 1.3 mg/dL — AB (ref 0.3–1.2)
Bilirubin, Direct: 0.6 mg/dL — ABNORMAL HIGH (ref 0.1–0.5)
Indirect Bilirubin: 0.7 mg/dL (ref 0.3–0.9)
Total Protein: 6.8 g/dL (ref 6.5–8.1)

## 2016-05-02 LAB — CBC WITH DIFFERENTIAL/PLATELET
EOS PCT: 0 %
Eosinophils Absolute: 0.1 10*3/uL (ref 0.0–0.7)
HEMATOCRIT: 22.4 % — AB (ref 39.0–52.0)
HEMOGLOBIN: 7.4 g/dL — AB (ref 13.0–17.0)
MCH: 30 pg (ref 26.0–34.0)
MCHC: 33 g/dL (ref 30.0–36.0)
MCV: 90.7 fL (ref 78.0–100.0)
Platelets: 137 10*3/uL — ABNORMAL LOW (ref 150–400)
RBC: 2.47 MIL/uL — ABNORMAL LOW (ref 4.22–5.81)
RDW: 23.5 % — ABNORMAL HIGH (ref 11.5–15.5)
WBC: 61.1 10*3/uL (ref 4.0–10.5)

## 2016-05-02 LAB — ECHOCARDIOGRAM COMPLETE
Height: 73 in
Weight: 2927.71 oz

## 2016-05-02 LAB — BASIC METABOLIC PANEL
Anion gap: 19 — ABNORMAL HIGH (ref 5–15)
BUN: 68 mg/dL — ABNORMAL HIGH (ref 6–20)
CHLORIDE: 96 mmol/L — AB (ref 101–111)
CO2: 17 mmol/L — ABNORMAL LOW (ref 22–32)
Calcium: 6.7 mg/dL — ABNORMAL LOW (ref 8.9–10.3)
Creatinine, Ser: 3.68 mg/dL — ABNORMAL HIGH (ref 0.61–1.24)
GFR calc non Af Amer: 14 mL/min — ABNORMAL LOW (ref >60)
GFR, EST AFRICAN AMERICAN: 16 mL/min — AB (ref >60)
Glucose, Bld: 135 mg/dL — ABNORMAL HIGH (ref 65–99)
POTASSIUM: 4.5 mmol/L (ref 3.5–5.1)
SODIUM: 132 mmol/L — AB (ref 135–145)

## 2016-05-02 LAB — LACTATE DEHYDROGENASE: LDH: 8068 U/L — ABNORMAL HIGH (ref 98–192)

## 2016-05-02 LAB — URIC ACID: URIC ACID, SERUM: 2 mg/dL — AB (ref 4.4–7.6)

## 2016-05-02 MED ORDER — FUROSEMIDE 10 MG/ML IJ SOLN
20.0000 mg | Freq: Every day | INTRAMUSCULAR | Status: DC
  Administered 2016-05-02: 20 mg via INTRAVENOUS
  Filled 2016-05-02: qty 2

## 2016-05-02 MED ORDER — MORPHINE SULFATE (PF) 2 MG/ML IV SOLN
1.0000 mg | INTRAVENOUS | Status: DC | PRN
  Administered 2016-05-02 – 2016-05-03 (×8): 1 mg via INTRAVENOUS
  Filled 2016-05-02 (×8): qty 1

## 2016-05-02 NOTE — Care Management Note (Addendum)
Case Management Note  Patient Details  Name: Joel Macdonald MRN: [id number] Date of Birth: 1931/01/23  Subjective/Objective:    Patient with leukocytosis and probably acute leukemic transformation s/p BM bx 1/23, hyperuricemia, presumed tumor lysis syndrome, thrombocytopenia, symptomatic anemia,  Acute respiratory failure with pulmonary infriltrates, aki, hyponatremia, hypomagnesium, rle stasis ulcer. Per MD patient is requiring venturi mask .  dispo family will discuss, palliative, hospice.   NCM will cont to follow for dc needs.       1/26 1343 Patient is now comfort care, on 10 liters, per MD note will start morphine drip.  Expect hospital death.  Family will decide later in the day , about dc monitoring , NCM will cont to follow for dc needs.                      Action/Plan:   Expected Discharge Date:                  Expected Discharge Plan:     In-House Referral:     Discharge planning Services  CM Consult  Post Acute Care Choice:    Choice offered to:     DME Arranged:    DME Agency:     HH Arranged:    HH Agency:     Status of Service:  In process, will continue to follow  If discussed at Long Length of Stay Meetings, dates discussed:    Additional Comments:  Zenon Mayo, RN 05/02/2016, 11:12 AM

## 2016-05-02 NOTE — Progress Notes (Signed)
Internal Medicine Attending:   I saw and examined the patient. I reviewed the resident's note and I agree with the resident's findings and plan as documented in the resident's note. On our evaluation [name] had his face mask off and was saturating at 80%, we replaced his mask and O2 sat improved to 100%.  He notes mask is uncomfortable at times and he removes it.  He overall continues to be aware of his medical diagnosis and poor prognosis.  He affirms his DNR status.  Plan is for son to arrive tomorrow.  His echo today reveals dilated RV, RA and elevated PA pressures, we are concerned from pulmonary leukostasis in the setting of his acute leukemic transformation.  He is not a candidate for treatment of this currently due to poor prognosis. For his acute renal failure, he is documented at 102cc UOP yesterday, only 100cc today, serum creatinine has risen to 3.7 from 3 yesterday, his bicarb has remained at 17.  AST and ALT are trending up as well indicating some mild liver damage.  It appears he has no real evidence of renal recovery yet, we will try a dose of lasix to possibly help with some volume overload.    We may try high flow nasal cannula since he appears to frequently take his venti mask off. Continue low dose morphine as needed for comfort Will continue supportive care, wants to see his son.

## 2016-05-02 NOTE — Progress Notes (Signed)
Transitioned patient to 10LHF nasal cannula from 50% VM SATS remained at 98%

## 2016-05-02 NOTE — Progress Notes (Signed)
  Echocardiogram 2D Echocardiogram has been performed.  Joel Macdonald 05/02/2016, 11:42 AM

## 2016-05-02 NOTE — Progress Notes (Addendum)
Subjective: Continues to feel short of breath, but his main complaint is thirst and dry mouth.  He is aware of his provisional diagnosis of acute leukemia, and worsening pulmonary and renal status.  He has been talking to his family, and his son is arranging travel to [place].  Yesterday afternoon, discussed his diagnosis, prognosis, and goals of care with Drs Beryle Beams and Nelda Marseille.  He understands that he is critically ill and without curative treatment options and that life expectancy is short.  He elected to be DNR/DNI, with goal of remaining comfortable and alert enough to talk to his family.  He has had difficulty tolerating the venturi mask, often removing it for comfort and to drink then becoming hypoxic.    Objective:  Vital signs in last 24 hours: Vitals:   05/01/16 1454 05/01/16 1930 05/02/16 0233 05/02/16 0721  BP: (!) 161/96 (!) 165/88 (!) 160/90 (!) 146/86  Pulse: (!) 116 (!) 103 (!) 102 90  Resp: (!) 35 (!) 35 19 (!) 26  Temp: (!) 96.2 F (35.7 C) 97.1 F (36.2 C) 97.6 F (36.4 C) 98.1 F (36.7 C)  TempSrc: Axillary Axillary Axillary Axillary  SpO2: 98% 98% 98% 98%  Weight:      Height:        Intake/Output Summary (Last 24 hours) at 05/02/16 1008 Last data filed at 05/01/16 2340  Gross per 24 hour  Intake               50 ml  Output              300 ml  Net             -250 ml    Physical Exam  Constitutional: He is oriented to person, place, and time.  Lying in bed sleeping with venturi mask at his side with O2 sat 80% Aroused easily to voice  Cardiovascular: Normal rate and regular rhythm.   Pulmonary/Chest:  Mild respiratory distress, tachypneic Lungs clear Good air movement  Musculoskeletal:  No peripheral edema  Neurological: He is alert and oriented to person, place, and time.  Psychiatric: He has a normal mood and affect. His behavior is normal.   CBC Latest Ref Rng & Units 05/02/2016 05/01/2016 04/30/2016  WBC 4.0 - 10.5 K/uL 61.1(HH)  44.9(H) 34.9(H)  Hemoglobin 13.0 - 17.0 g/dL 7.4(L) 7.3(L) 8.2(L)  Hematocrit 39.0 - 52.0 % 22.4(L) 21.9(L) 24.5(L)  Platelets 150 - 400 K/uL 137(L) 89(L) 70(L)   CMP Latest Ref Rng & Units 05/02/2016 05/01/2016 04/30/2016  Glucose 65 - 99 mg/dL 135(H) 84 91  BUN 6 - 20 mg/dL 68(H) 56(H) 49(H)  Creatinine 0.61 - 1.24 mg/dL 3.68(H) 3.02(H) 2.92(H)  Sodium 135 - 145 mmol/L 132(L) 132(L) 133(L)  Potassium 3.5 - 5.1 mmol/L 4.5 3.6 3.6  Chloride 101 - 111 mmol/L 96(L) 102 102  CO2 22 - 32 mmol/L 17(L) 17(L) 17(L)  Calcium 8.9 - 10.3 mg/dL 6.7(L) 6.7(L) 6.9(L)  Total Protein 6.5 - 8.1 g/dL 6.8 6.7 6.8  Total Bilirubin 0.3 - 1.2 mg/dL 1.3(H) 1.1 1.4(H)  Alkaline Phos 38 - 126 U/L 138(H) 138(H) 149(H)  AST 15 - 41 U/L 298(H) 126(H) 118(H)  ALT 17 - 63 U/L 104(H) 26 25   Component     Latest Ref Rng & Units 04/30/2016 05/01/2016 05/02/2016  Uric Acid, Serum     4.4 - 7.6 mg/dL >21.0 (H) 5.7 2.0 (L)   Component     Latest Ref Rng & Units 04/30/2016 05/01/2016  05/02/2016  LDH     98 - 192 U/L 5,286 (H) 6,492 (H) 8,068 (H)   Assessment/Plan:  Principal Problem:   Acute respiratory failure with hypoxia (HCC) Active Problems:   HYPERTENSION, BENIGN SYSTEMIC   MDS (myelodysplastic syndrome), low grade (HCC)   Venous insufficiency (chronic) (peripheral)   Venous ulcer of right lower extremity with varicose veins (HCC)   Symptomatic anemia   Leukocytosis   AKI (acute kidney injury) ([place])   Hyperuricemia   Stasis ulcer (HCC)   Tumor lysis syndrome   Hypoxia   Acute myeloid leukemia not having achieved remission ([place])   83 y.o. male with MDS who presented with acute leukemic transformation, tumor lysis syndrome, and evolving multisystem organ failure who has limited life expectancy.  Providing supportive and symptomatic care as his family arranges travel to visit.  #Hypoxic Respiratory Failure Stable, etiology unclear, possible viral pneumonia vs pulmonary leukostasis.  History of  nonproductive cough, worsening dyspnea, and hypoxemia with normal V/Q scan.  Increasing O2 requirement and worsening A-a gradient since admission.  Chest radiographs and CT with possible mild pulmonary edema and RUL infiltrate.  His lungs remain clear and chest imaging has shown only mild pulmonary edema, so I do not think pulmonary edema is a major contributor to his hypoxia.   -Supplemental O2 as needed; try HFNC to allow for eating/drinking -Discontinue Cefepime and oseltamavir -Morphine PRN for dyspnea -Telesitter with concern for hypoxia and confusion  #Hyperuricemia #Tumor Lysis Syndrome #Acute Renal Insufficiency #Metabolic Acidosis Worsening oliguric acute renal failure.  Uric acid is controlled with allopurinol after rasburicase, but his GFR continues to decline and he has become oliguric.  His K is climbing, but he currently has no critical electrolyte abnormalities and is not volume overloaded.  Will try gentle IV diuresis to increase UOP in case crystal nephropathy is contributing to his evolving renal failure. -allopurinol 200 mg daily -daily uric acid -foley -I/Os -IVF D5 bicarb 100 mL/hr -IV lasix 20 mg -Follow renal function  #Leukocytosis #MDS #Likely Acute Leukemic Transformation Per Dr Azucena Freed evaluation of his peripheral smear, many blasts rather than bands initially reported by the lab.  Most likely represents leukemic transformation of his MDS, but will continue to rule out infectious etiologies especially in light of his hypoxia.  Infectious workup negative so far; negative RVP, CDiff, influenza.  Blood cultures were not collected as intended prior to initiating antibiotics due to conflict with RBC transfusion. -Hematology ([facility]) following -Bone marrow biopsy performed 1/23  #Anemia History of chronic, transfusion-dependent anemia, now below baseline.  Likely contributing in part to his symptoms. Transfused 2U pRBCs without symptomatic  improvement.  #Hypokalemia #Hypomagnesemia Repleted K and Mg. -Follow electrolytes  #Chronic Leg Wound Evaluated by Dr Sharol Given, recommended empiric pseudomonal coverage, but does not seem to be infected. -WOC consult -Discontinue Cefepime (started 1/23)  Fluids: D5 w/ bicarb 125 mL/hr Diet: regular DVT Prophylaxis: lovenox Code Status: DNR/DNI  Dispo: Anticipated discharge in 2-3 days.   [name], MD 05/02/2016, 10:07 AM Pager: [phone]

## 2016-05-02 NOTE — Progress Notes (Signed)
Spoke with MD this morning regarding pt's labs. Stated he was aware and will be up within a few hours to round. Stated whatever the patient requests for symptoms to let him know. Reported to oncoming nurse. Will continue to monitor.

## 2016-05-02 NOTE — Progress Notes (Signed)
Hematology: Uncomfortable; could not tolerate BIPAP but O2 sats good on 40% Ventimask Afebrile on Fortaz & Tamiflu Urine output 450 ml over last 24 hrs. & creatinine rising; now acidemic HCO3- 17, anion gap 19 WBC now rising @ 60,000, Hb down to 7.2, platelets holding; no more active bleeding Transaminases  & bilirubin up - hypoxic liver damage? Uric acid 2.0;  LDH 8,000 PE: Blood pressure (!) 146/86, pulse 90, temperature 98.1 F (36.7 C), temperature source Axillary, resp. rate (!) 26, height 6\' 1"  (1.854 m), weight 182 lb 15.7 oz (83 kg), SpO2 98 %. awake, alert, Lungs clear; Cor reg; abd soft tender RUQ; no RLE edema; no cyanosis Impression: 1. Leukemic conversion of high risk MDS Palliative Rx. 2. Tumor lysis syndrome due to 1. 3. ARF due to 1 & 2 4. Hypoxic resp failure viral pneumonia vs leukemic pulmonary infiltrates 5. Chronic non healing ulcer RLE Rec: Continue supportive care.  Attempt diuresis to see if kidneys will open now that uric acid down May need to add bicarb to IV fluids if he doesn't diurese but everything we do will only be a temporary fix. Attempt to keep him as stable as possible until his son gets here from [place]. Morphine & lorazepam prn Thanks!

## 2016-05-02 NOTE — Care Management Note (Addendum)
Case Management Note  Patient Details  Name: Joel Macdonald MRN: [id number] Date of Birth: 03-21-1931  Subjective/Objective:  Patient with leukocytosis and probably acute leukemic transformation s/p BM bx 1/23, hyperuricemia, presumed tumor lysis syndrome, thrombocytopenia, symptomatic anemia,  Acute respiratory failure with pulmonary infriltrates, aki, hyponatremia, hypomagnesium, rle stasis ulcer. Per MD patient is requiring venturi mask .  dispo family will discuss, palliative, hospice.   NCM will cont to follow for dc needs.                 Action/Plan:   Expected Discharge Date:                  Expected Discharge Plan:  [place]  In-House Referral:     Discharge planning Services  CM Consult  Post Acute Care Choice:    Choice offered to:     DME Arranged:    DME Agency:     HH Arranged:    [place] Agency:     Status of Service:  In process, will continue to follow  If discussed at Long Length of Stay Meetings, dates discussed:    Additional Comments:  Zenon Mayo, RN 05/02/2016, 9:10 AM

## 2016-05-03 ENCOUNTER — Ambulatory Visit (INDEPENDENT_AMBULATORY_CARE_PROVIDER_SITE_OTHER): Payer: Medicare Other | Admitting: Family

## 2016-05-03 DIAGNOSIS — E872 Acidosis: Secondary | ICD-10-CM

## 2016-05-03 DIAGNOSIS — C9300 Acute monoblastic/monocytic leukemia, not having achieved remission: Principal | ICD-10-CM

## 2016-05-03 DIAGNOSIS — L97219 Non-pressure chronic ulcer of right calf with unspecified severity: Secondary | ICD-10-CM

## 2016-05-03 LAB — RENAL FUNCTION PANEL
Albumin: 2.8 g/dL — ABNORMAL LOW (ref 3.5–5.0)
Anion gap: 21 — ABNORMAL HIGH (ref 5–15)
BUN: 82 mg/dL — AB (ref 6–20)
CHLORIDE: 90 mmol/L — AB (ref 101–111)
CO2: 24 mmol/L (ref 22–32)
CREATININE: 4.56 mg/dL — AB (ref 0.61–1.24)
Calcium: 6.5 mg/dL — ABNORMAL LOW (ref 8.9–10.3)
GFR calc Af Amer: 12 mL/min — ABNORMAL LOW (ref >60)
GFR calc non Af Amer: 11 mL/min — ABNORMAL LOW (ref >60)
GLUCOSE: 108 mg/dL — AB (ref 65–99)
Phosphorus: 8.5 mg/dL — ABNORMAL HIGH (ref 2.5–4.6)
Potassium: 4.2 mmol/L (ref 3.5–5.1)
Sodium: 135 mmol/L (ref 135–145)

## 2016-05-03 LAB — URIC ACID: URIC ACID, SERUM: 1.7 mg/dL — AB (ref 4.4–7.6)

## 2016-05-03 LAB — CBC
HCT: 21.3 % — ABNORMAL LOW (ref 39.0–52.0)
Hemoglobin: 6.9 g/dL — CL (ref 13.0–17.0)
MCH: 28.9 pg (ref 26.0–34.0)
MCHC: 32.4 g/dL (ref 30.0–36.0)
MCV: 89.1 fL (ref 78.0–100.0)
PLATELETS: 165 10*3/uL (ref 150–400)
RBC: 2.39 MIL/uL — ABNORMAL LOW (ref 4.22–5.81)
RDW: 23 % — AB (ref 11.5–15.5)
WBC: 61.4 10*3/uL (ref 4.0–10.5)

## 2016-05-03 MED ORDER — HALOPERIDOL LACTATE 5 MG/ML IJ SOLN: 0.5000 mg | INTRAMUSCULAR | Status: DC | PRN

## 2016-05-03 MED ORDER — ONDANSETRON HCL 4 MG/2ML IJ SOLN: 4.0000 mg | Freq: Four times a day (QID) | INTRAMUSCULAR | Status: DC | PRN

## 2016-05-03 MED ORDER — ACETAMINOPHEN 325 MG PO TABS: 650.0000 mg | ORAL_TABLET | Freq: Four times a day (QID) | ORAL | Status: DC | PRN

## 2016-05-03 MED ORDER — HALOPERIDOL LACTATE 2 MG/ML PO CONC
0.5000 mg | ORAL | Status: DC | PRN
  Filled 2016-05-03: qty 0.3

## 2016-05-03 MED ORDER — HALOPERIDOL 0.5 MG PO TABS
0.5000 mg | ORAL_TABLET | ORAL | Status: DC | PRN
  Filled 2016-05-03: qty 1

## 2016-05-03 MED ORDER — ONDANSETRON 4 MG PO TBDP
4.0000 mg | ORAL_TABLET | Freq: Four times a day (QID) | ORAL | Status: DC | PRN
  Filled 2016-05-03: qty 1

## 2016-05-03 MED ORDER — POLYVINYL ALCOHOL 1.4 % OP SOLN
1.0000 [drp] | Freq: Four times a day (QID) | OPHTHALMIC | Status: DC | PRN
  Filled 2016-05-03: qty 15

## 2016-05-03 MED ORDER — BIOTENE DRY MOUTH MT LIQD: 15.0000 mL | OROMUCOSAL | Status: DC | PRN

## 2016-05-03 MED ORDER — SODIUM CHLORIDE 0.9 % IV SOLN
1.0000 mg/h | INTRAVENOUS | Status: DC
  Administered 2016-05-03: 1 mg/h via INTRAVENOUS
  Filled 2016-05-03: qty 10

## 2016-05-03 MED ORDER — GLYCOPYRROLATE 0.2 MG/ML IJ SOLN: 0.2000 mg | INTRAMUSCULAR | Status: DC | PRN

## 2016-05-03 MED ORDER — ACETAMINOPHEN 650 MG RE SUPP: 650.0000 mg | Freq: Four times a day (QID) | RECTAL | Status: DC | PRN

## 2016-05-03 MED ORDER — GLYCOPYRROLATE 1 MG PO TABS: 1.0000 mg | ORAL_TABLET | ORAL | Status: DC | PRN

## 2016-05-03 MED ORDER — SODIUM CHLORIDE 0.9 % IV SOLN: Freq: Once | INTRAVENOUS | Status: DC

## 2016-05-04 LAB — TYPE AND SCREEN
ABO/RH(D): O POS
Antibody Screen: POSITIVE
DAT, IgG: POSITIVE

## 2016-05-06 ENCOUNTER — Encounter (HOSPITAL_COMMUNITY): Payer: Medicare Other

## 2016-05-06 NOTE — Telephone Encounter (Signed)
called the patient and discussed CBC results. He will follow up in office as scheduled.

## 2016-05-08 LAB — BONE MARROW EXAM

## 2016-05-08 LAB — CHROMOSOME ANALYSIS, BONE MARROW

## 2016-05-09 NOTE — Progress Notes (Addendum)
Dr. Inda Castle, MD aware of TOD. PIV DC, hemostasis achieved. Foley cathter DC. RLE wound undressed. Post-mortem care completed. Pt transferred to morgue via 2 RN, placed in cooler.   Waste 240mg /ml (226mL) morphine with Phineas Douglas, RN. Waste in sink.

## 2016-05-09 NOTE — Progress Notes (Signed)
Subjective: Son Joel Macdonald got the hospital last night and was able to talk to his father.  This morning, he is less responsive and has not been communicating.  His wife and son agree that comfort care is now the best course of action.  Objective:  Vital signs in last 24 hours: Vitals:   05/02/16 1624 05/02/16 2040 05/02/16 2258 May 24, 2016 0333  BP: (!) 139/92 (!) 145/85 131/78 129/88  Pulse: (!) 109 92 93 65  Resp: (!) 29 (!) 24 (!) 24 20  Temp: 97.9 F (36.6 C) 97.7 F (36.5 C) 97.9 F (36.6 C) 97.7 F (36.5 C)  TempSrc: Oral Oral Axillary Axillary  SpO2: 98% 100% 92% 100%  Weight:      Height:        Intake/Output Summary (Last 24 hours) at 05-24-16 5456 Last data filed at 2016-05-24 0400  Gross per 24 hour  Intake          4407.91 ml  Output              550 ml  Net          3857.91 ml    Physical Exam  Constitutional: He is oriented to person, place, and time.  Lying in bed sleeping with venturi mask at his side with O2 sat 80% Aroused easily to voice  Cardiovascular: Normal rate and regular rhythm.   Pulmonary/Chest:  Mild respiratory distress, tachypneic Lungs clear Good air movement  Musculoskeletal:  No peripheral edema  Neurological: He is alert and oriented to person, place, and time.  Psychiatric: He has a normal mood and affect. His behavior is normal.   CBC Latest Ref Rng & Units 24-May-2016 05/02/2016 05/01/2016  WBC 4.0 - 10.5 K/uL 61.4(HH) 61.1(HH) 44.9(H)  Hemoglobin 13.0 - 17.0 g/dL 6.9(LL) 7.4(L) 7.3(L)  Hematocrit 39.0 - 52.0 % 21.3(L) 22.4(L) 21.9(L)  Platelets 150 - 400 K/uL 165 137(L) 89(L)   CMP Latest Ref Rng & Units 2016-05-24 05/02/2016 05/01/2016  Glucose 65 - 99 mg/dL 108(H) 135(H) 84  BUN 6 - 20 mg/dL 82(H) 68(H) 56(H)  Creatinine 0.61 - 1.24 mg/dL 4.56(H) 3.68(H) 3.02(H)  Sodium 135 - 145 mmol/L 135 132(L) 132(L)  Potassium 3.5 - 5.1 mmol/L 4.2 4.5 3.6  Chloride 101 - 111 mmol/L 90(L) 96(L) 102  CO2 22 - 32 mmol/L 24 17(L) 17(L)  Calcium 8.9  - 10.3 mg/dL 6.5(L) 6.7(L) 6.7(L)  Total Protein 6.5 - 8.1 g/dL - 6.8 6.7  Total Bilirubin 0.3 - 1.2 mg/dL - 1.3(H) 1.1  Alkaline Phos 38 - 126 U/L - 138(H) 138(H)  AST 15 - 41 U/L - 298(H) 126(H)  ALT 17 - 63 U/L - 104(H) 26   Assessment/Plan:  Principal Problem:   Acute respiratory failure with hypoxia (HCC) Active Problems:   HYPERTENSION, BENIGN SYSTEMIC   MDS (myelodysplastic syndrome), low grade (HCC)   Venous insufficiency (chronic) (peripheral)   Venous ulcer of right lower extremity with varicose veins (HCC)   Symptomatic anemia   Leukocytosis   AKI (acute kidney injury) ([place])   Hyperuricemia   Stasis ulcer (HCC)   Tumor lysis syndrome   Hypoxia   Acute myeloid leukemia not having achieved remission ([place])   83 y.o. male with MDS who presented with acute leukemic transformation, tumor lysis syndrome, and evolving multisystem organ failure who has limited life expectancy.  Now on comfort care for expected hospital death.  #Hypoxic Respiratory Failure Stable, etiology unclear, possible viral pneumonia vs pulmonary leukostasis.  History of nonproductive cough,  worsening dyspnea, and hypoxemia with normal V/Q scan.  Increasing O2 requirement and worsening A-a gradient since admission.  Chest radiographs and CT with possible mild pulmonary edema and RUL infiltrate.  His lungs remain clear and chest imaging has shown only mild pulmonary edema, so I do not think pulmonary edema is a major contributor to his hypoxia.   -[place] O2 and morphine infusion for dyspnea.  #Hyperuricemia #Tumor Lysis Syndrome #Acute Renal Insufficiency #Metabolic Acidosis Worsening oliguric acute renal failure.  Uric acid is controlled with allopurinol after rasburicase, but his GFR continues to decline and he has become oliguric.  His K is climbing, but he currently has no critical electrolyte abnormalities and is not volume overloaded.  Will try gentle IV diuresis to increase UOP in case crystal  nephropathy is contributing to his evolving renal failure. -continue foley for comfort -no further labs  #Acute Monoblastic Leukemia Per Dr Azucena Freed evaluation of his peripheral smear, many blasts rather than bands initially reported by the lab.  Most likely represents leukemic transformation of his MDS, but will continue to rule out infectious etiologies especially in light of his hypoxia.  Infectious workup negative so far; negative RVP, CDiff, influenza.  Blood cultures were not collected as intended prior to initiating antibiotics due to conflict with RBC transfusion. -Hematology ([facility]) following -Bone marrow biopsy performed 1/23  #Anemia History of chronic, transfusion-dependent anemia, now below baseline.  Likely contributing in part to his symptoms. Transfused 2U pRBCs without symptomatic improvement.  #Hypokalemia #Hypomagnesemia -No further labs  #Chronic Leg Wound Evaluated by Dr Sharol Given, recommended empiric pseudomonal coverage, but does not seem to be infected. -WOC consult -Discontinue Cefepime (started 1/23)  Fluids: none Diet: none DVT Prophylaxis: none Code Status: DNR/DNI  Dispo: Anticipated hospital death.  [name], MD 05-08-2016, 7:12 AM Pager: [phone]

## 2016-05-09 NOTE — Progress Notes (Signed)
Hematology: Further decline in renal function and oxygenation. Responsive but speech garbled shortly after a dose of morphine. Exam otherwise unchanged. Lungs remain clear. Cor: regular. Abd: tender RUQ. Patient's physician son Octavia Bruckner here. Other son Gerald Stabs lives in [place]. Impression: 1. 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 Tumor lysis syndrome due to 1. 3. ARF due to 1&2 4. Hypoxic resp failure-acute due to viral pneumonia vs leukemic pulmonary infiltrates - favor leukemia as etiology 5. Non healing chronic venous stasis ulcer right calf Dispo: Comfort measures only at this time No routine lab, no transfusions Begin morphine continuous IV infusion @ 1 mg/hour. Titrate by 1 mg increments every hour as need to keep patient comfortable. Discussed w housestaff.

## 2016-05-09 NOTE — Discharge Summary (Signed)
  Name: Joel Macdonald MRN: [id number] DOB: 06-27-1930 83 y.o.  Date of Admission: 05/05/2016  1:00 PM Date of Discharge: Jun 01, 2016 Attending Physician: Lucious Groves, DO  Discharge Diagnosis: Principal Problem:   Acute myeloid leukemia not having achieved remission [facility]) Active Problems:   HYPERTENSION, BENIGN SYSTEMIC   MDS (myelodysplastic syndrome), low grade (HCC)   Venous insufficiency (chronic) (peripheral)   Venous ulcer of right lower extremity with varicose veins (HCC)   Acute respiratory failure with hypoxia (HCC)   Symptomatic anemia   Leukocytosis   AKI (acute kidney injury) ([place])   Hyperuricemia   Stasis ulcer (HCC)   Tumor lysis syndrome   Hypoxia  Cause of death: Hypoxic respiratory failure Time of death: 07/13/1523  Disposition and follow-up:   Joel Macdonald was discharged from [facility] in expired condition.    Hospital Course: [name] was an 83 year old man with history of myelodysplastic syndrome who was directed to the ED from clinic by his hematologist/oncologist [name] for worsening fatigue and dyspnea.  At admission, he was dyspneic, but alert, oriented, and maintaining O2 saturations with 2L supplemental O2 via nasal canula.  V/Q scan was negative for PE, but he was found to have hyperleukocytosis, anemia, acute kidney injury, and new hypoxic respiratory failure with mild nonspecific pulmonary infiltrates on chest imaging.  ABG showed respiratory alkalosis and mild hypoxia with increased A-a gradient.  He was transfused with 2U pRBCs for anemia, but his symptoms did not improve.    Review of his peripheral blood smear by [name] revealed numerous large, poorly differentiated blasts, and he was found to be hyperuricemic with elevated LDH.  The presumptive diagnosis of acute leukemic transformation of his MDS was made, and bone marrow biopsy later confirmed acute monocytic leukemia.  He was treated for tumor lysis syndrome  with rasburicase, IV fluids, and allopurinol.  His hyperuricemia resolved, but his renal function continued to deteriorate and he became oliguric.  The etiology of his hypoxic respiratory was unclear, potentially pulmonary leukostasis, leukemic infiltration, or viral pneumonia.  His hypoxic respiratory failure worsened despite empiric treatment with cefepime and oseltamavir.  The lack of available curative treatment options and goals of care were discussed with [name].  He chose to pursue comfort care and was made DNR, and wanted to remain as alert as possible until his family was able to travel to [place].  He was supported with supplemental oxygen and IV fluids until his son arrived, after which he became lethargic.  He was started on morphine continuous infusion and died the same afternoon.  Signed: [name], MD 2016-06-01, 3:45 PM

## 2016-05-09 NOTE — Progress Notes (Signed)
Physician notified: Inda Castle At: 0914  Regarding: Pt developed new antibodies, BB working on blood. Still want 1 unit given? Spouse at bedside now.

## 2016-05-09 NOTE — Care Management Important Message (Signed)
Important Message  Patient Details  Name: Joel Macdonald MRN: [id number] Date of Birth: 1930-10-22   Medicare Important Message Given:  Yes    Nathen May 2016-05-04, 2:07 PM

## 2016-05-09 NOTE — Progress Notes (Signed)
Internal Medicine Attending:   I saw and examined the patient. I reviewed the resident's note and I agree with the resident's findings and plan as documented in the resident's note. Joel Joel Macdonald seen on AM rounds, he is much less interactive today, son and wife are at bedside.  He remains oliguric and creatinine continues to rise despite improved uric acid.  On exam his lungs are grossly clear, heart is regular, no pedal edema.  His is currently able to maintain saturations with high flow nasal cannula.  We discussed his status with Joel Macdonald as well as family, they have elected to purse comfort measures.  Will start morphine drip for air hunger, discussed with family about D/C of monitoring, they will make decision later in the day, will attempt to treat other symptoms as they arise.  Family has decied repeat blood transfusion as this would require additional "sticks."

## 2016-05-09 DEATH — deceased

## 2017-12-30 IMAGING — CT CT CHEST W/O CM
2 of 3 series · 15 of 36 positions shown, 18 images · non-contrast
Comparison: 04/30/2016 chest x-ray. No comparison chest CT. Lumbar
spine MR 02/21/2015.

CLINICAL DATA: 85-year-old male with shortness breath worse on
exertion. Subsequent encounter.

EXAM:
CT CHEST WITHOUT CONTRAST
TECHNIQUE: Multidetector CT imaging of the chest was performed following the
standard protocol without IV contrast.

[Series 3: chest w/o 2mm st · axial · non-contrast · 0.88mm/px · z∈[+1102,+1400]mm · 12 of 175 slices shown, 15 images]
[im 13/175  mediastinal]
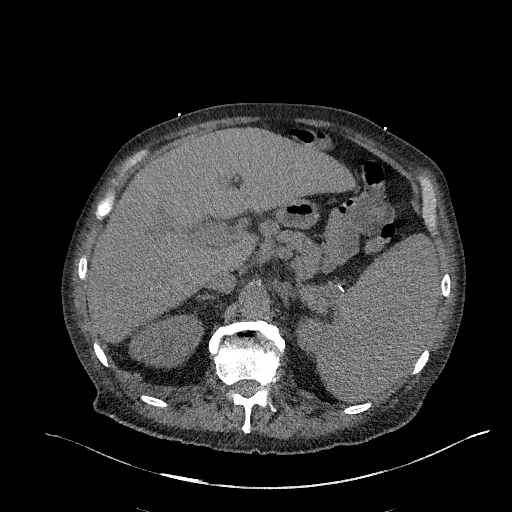
[im 13/175  lung]
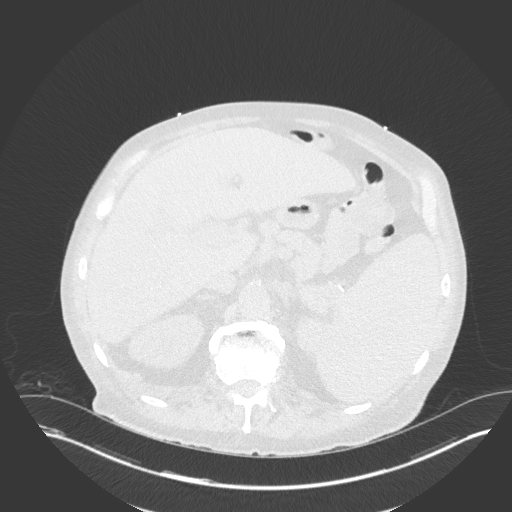
[im 26/175  lung]
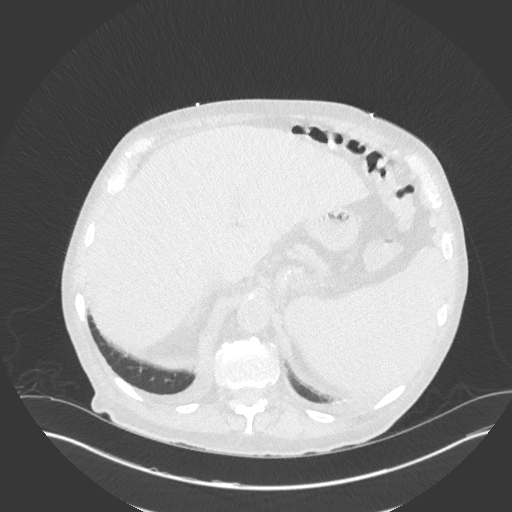
[im 39/175  lung]
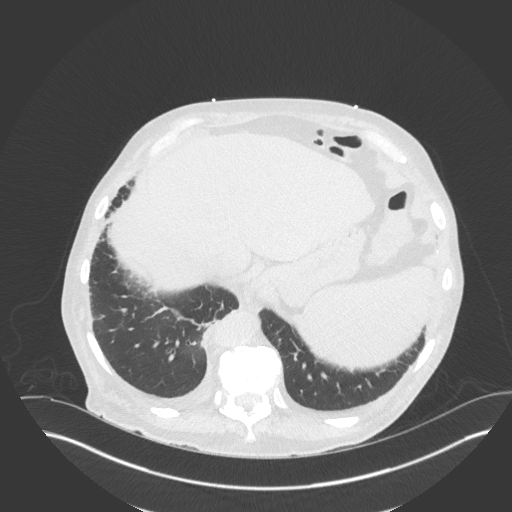
[im 52/175  lung]
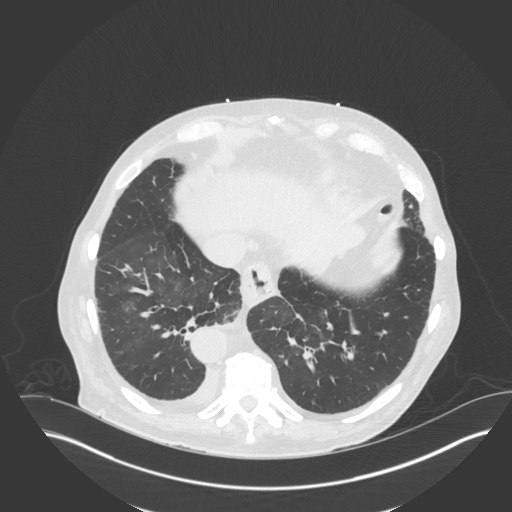
[im 65/175  mediastinal]
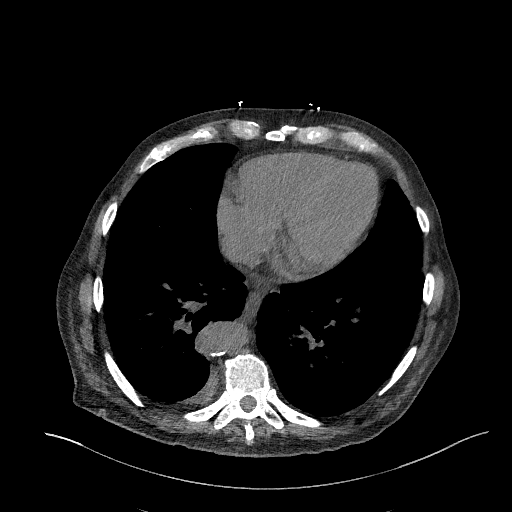
[im 65/175  lung]
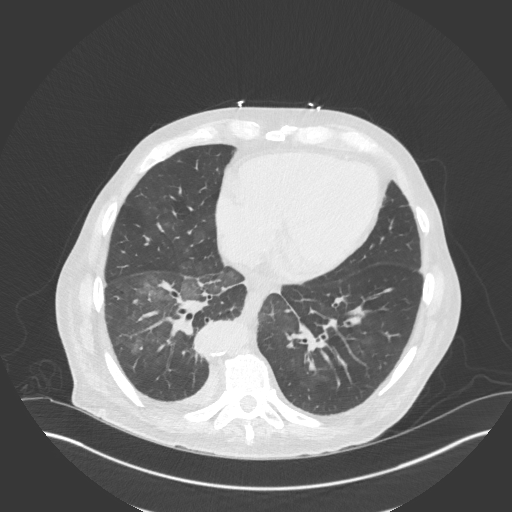
[im 78/175  lung]
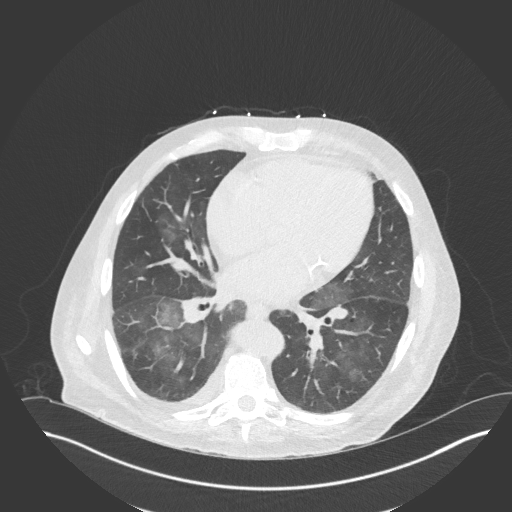
[im 97/175  lung]
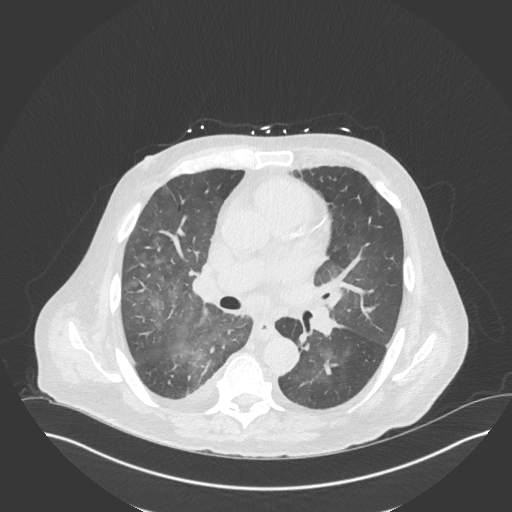
[im 110/175  lung]
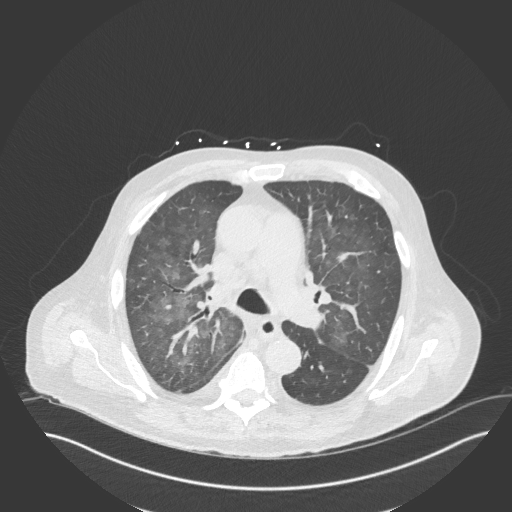
[im 123/175  mediastinal]
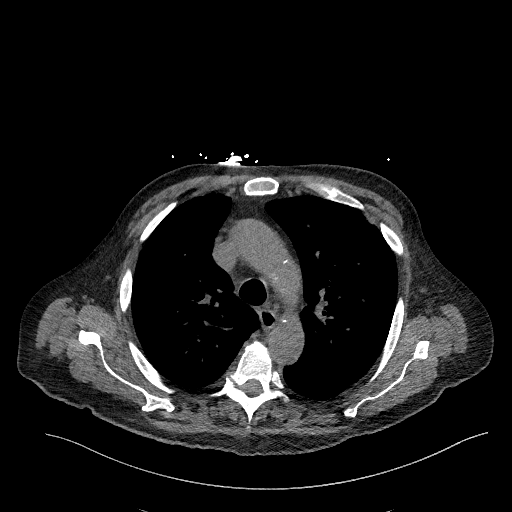
[im 123/175  lung]
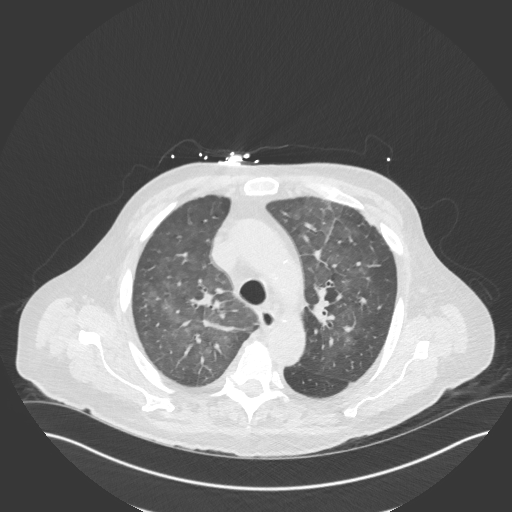
[im 136/175  lung]
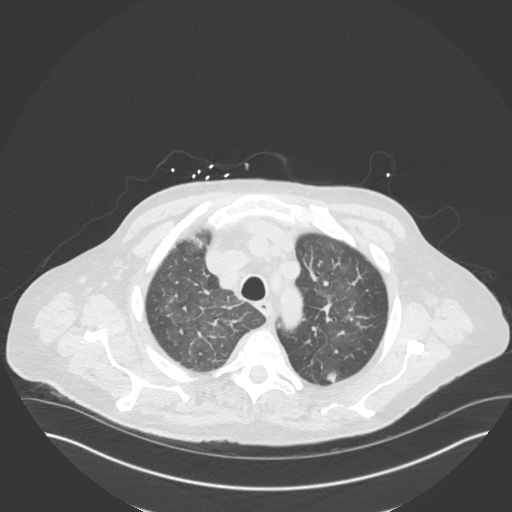
[im 149/175  lung]
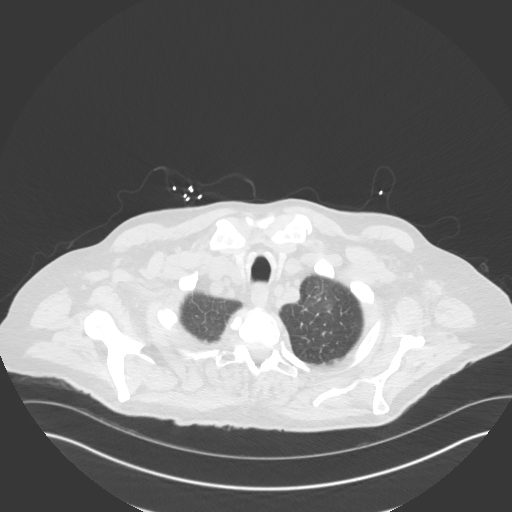
[im 162/175  lung]
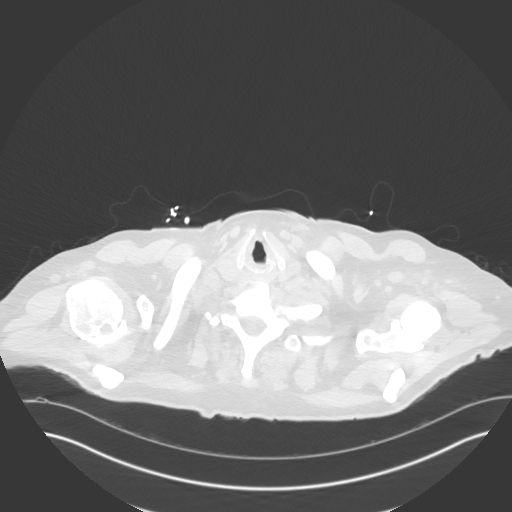

[Series 5: chest w/o 3mm st cor · coronal · non-contrast · 0.68mm/px · 3 of 101 slices shown]
[im 21/101  lung]
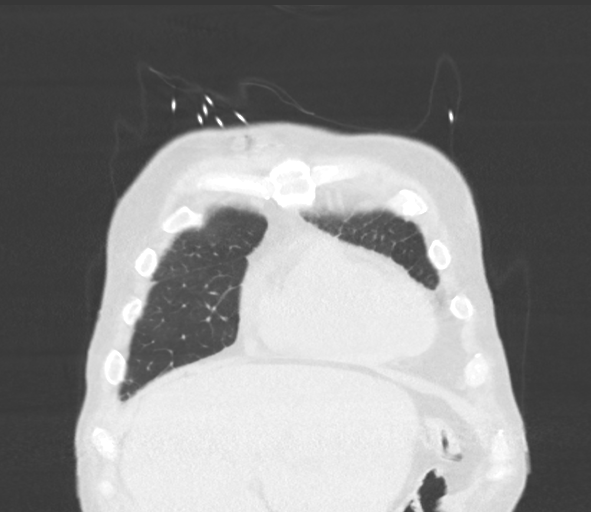
[im 41/101  lung]
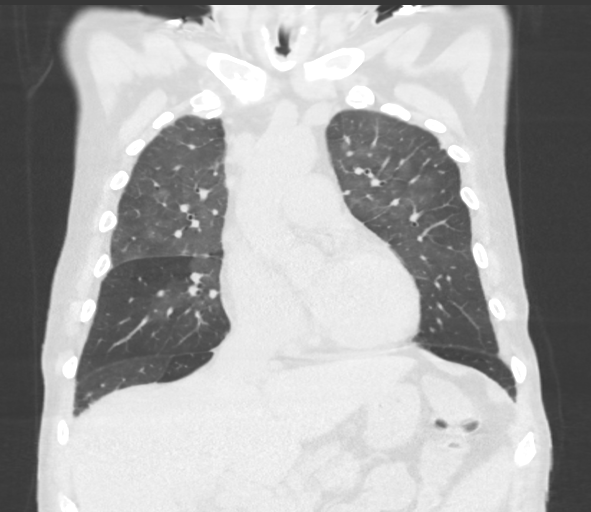
[im 61/101  lung]
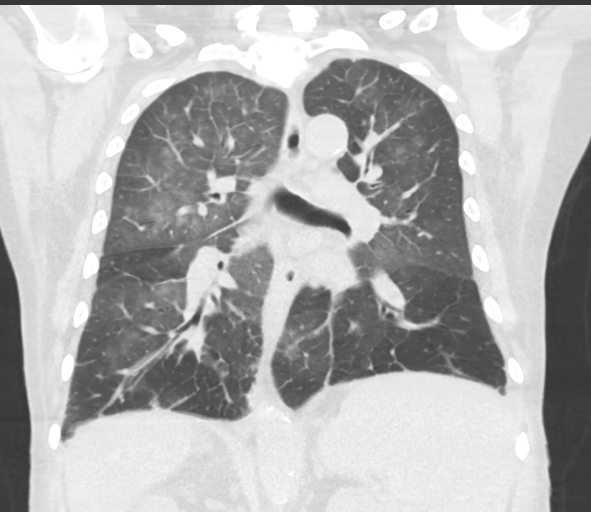

[15 of 36 positions shown; findings below may reference images not displayed]

FINDINGS: Cardiovascular: Coronary artery calcifications. Mitral valve
calcifications. Calcification aorta.

Calcified aorta with descending aorta ectatic deviating to the
right.

Mediastinum/Nodes: Slight prominence size subcarinal lymph node
possibly reactive in origin.

Small hiatal hernia.

Lungs/Pleura: Hazy parenchymal changes throughout lungs slightly
greater centrally and minimally more notable on the right. Small
right-sided pleural effusion. This suggests mild pulmonary edema
whether cardiogenic or noncardiogenic in origin. Result of drug
reaction could give similar changes.

The additional finding of focal opacities left upper lobe the
largest measuring 1.8 cm and having a ground-glass appearance
suggests that infection or adenocarcinoma cannot be entirely
excluded.

Upper Abdomen: Prominent splenomegaly. Adenopathy peripancreatic
region.

Musculoskeletal: Remote prominent compression fracture L2 with
retropulsion similar to prior MR. scoliosis thoracic spine.

1.7 cm lucency right humeral head possibly degenerative in origin as
there are glenohumeral joint degenerative changes. Less notable is a
subchondral cystic changes left glenohumeral joint.
IMPRESSION: Hazy parenchymal changes throughout lungs slightly greater centrally
and minimally more notable on the right. Small right-sided pleural
effusion. This suggests mild pulmonary edema whether cardiogenic or
noncardiogenic in origin. Result of drug reaction could give similar
changes.

Left upper lobe ground-glass opacities largest measuring 1.8 cm
(series 4, image 35) suggests that infection or adenocarcinoma
cannot be entirely excluded. Non-contrast chest CT at 3-6 months is
recommended. If nodules persist, subsequent management will be based
upon the most suspicious nodule(s). This recommendation follows the
consensus statement: Guidelines for Management of Incidental
Pulmonary Nodules Detected on CT Images: From the [HOSPITAL]

Prominent splenomegaly.

Peripancreatic adenopathy.

Aortic atherosclerosis.

Coronary artery calcifications.

Remote prominent compression fracture L2 with retropulsion similar
to prior MR.

1.7 cm lucency right humeral head possibly degenerative in origin as
there are glenohumeral joint degenerative changes. Cannot exclude
involvement by tumor.
